# Patient Record
Sex: Female | Born: 1940 | Race: White | Hispanic: No | Marital: Married | Smoking: Former smoker
Health system: Southern US, Community
[De-identification: ages and names within clinical notes are randomized; demographics above are authoritative.]

## PROBLEM LIST (undated history)

## (undated) DIAGNOSIS — E119 Type 2 diabetes mellitus without complications: Secondary | ICD-10-CM

## (undated) DIAGNOSIS — F32A Depression, unspecified: Secondary | ICD-10-CM

## (undated) DIAGNOSIS — R519 Headache, unspecified: Secondary | ICD-10-CM

## (undated) DIAGNOSIS — I219 Acute myocardial infarction, unspecified: Secondary | ICD-10-CM

## (undated) DIAGNOSIS — T7840XA Allergy, unspecified, initial encounter: Secondary | ICD-10-CM

## (undated) DIAGNOSIS — J40 Bronchitis, not specified as acute or chronic: Secondary | ICD-10-CM

## (undated) DIAGNOSIS — F329 Major depressive disorder, single episode, unspecified: Secondary | ICD-10-CM

## (undated) DIAGNOSIS — I2510 Atherosclerotic heart disease of native coronary artery without angina pectoris: Secondary | ICD-10-CM

## (undated) DIAGNOSIS — I10 Essential (primary) hypertension: Secondary | ICD-10-CM

## (undated) DIAGNOSIS — R51 Headache: Secondary | ICD-10-CM

## (undated) DIAGNOSIS — M1990 Unspecified osteoarthritis, unspecified site: Secondary | ICD-10-CM

## (undated) DIAGNOSIS — K219 Gastro-esophageal reflux disease without esophagitis: Secondary | ICD-10-CM

## (undated) HISTORY — PX: TOTAL ABDOMINAL HYSTERECTOMY: SHX209

## (undated) HISTORY — PX: ABDOMINAL HYSTERECTOMY: SHX81

## (undated) HISTORY — PX: TONSILLECTOMY: SUR1361

## (undated) HISTORY — DX: Allergy, unspecified, initial encounter: T78.40XA

## (undated) HISTORY — PX: JOINT REPLACEMENT: SHX530

## (undated) HISTORY — DX: Major depressive disorder, single episode, unspecified: F32.9

## (undated) HISTORY — PX: APPENDECTOMY: SHX54

## (undated) HISTORY — PX: CORONARY ANGIOPLASTY: SHX604

## (undated) HISTORY — DX: Depression, unspecified: F32.A

---

## 2004-11-26 ENCOUNTER — Ambulatory Visit: Payer: Self-pay | Admitting: Unknown Physician Specialty

## 2005-12-22 ENCOUNTER — Ambulatory Visit: Payer: Self-pay

## 2006-11-02 ENCOUNTER — Ambulatory Visit: Payer: Self-pay

## 2007-08-31 ENCOUNTER — Ambulatory Visit: Payer: Self-pay | Admitting: Family Medicine

## 2008-11-12 ENCOUNTER — Ambulatory Visit: Payer: Self-pay | Admitting: Family Medicine

## 2008-11-20 ENCOUNTER — Ambulatory Visit: Payer: Self-pay | Admitting: Family Medicine

## 2009-12-09 ENCOUNTER — Ambulatory Visit: Payer: Self-pay | Admitting: Family Medicine

## 2009-12-26 ENCOUNTER — Ambulatory Visit: Payer: Self-pay | Admitting: Family Medicine

## 2010-12-30 ENCOUNTER — Ambulatory Visit: Payer: Self-pay | Admitting: Family Medicine

## 2012-01-20 ENCOUNTER — Ambulatory Visit: Payer: Self-pay | Admitting: Family Medicine

## 2012-02-09 DIAGNOSIS — M2010 Hallux valgus (acquired), unspecified foot: Secondary | ICD-10-CM | POA: Insufficient documentation

## 2012-02-09 DIAGNOSIS — G5760 Lesion of plantar nerve, unspecified lower limb: Secondary | ICD-10-CM | POA: Insufficient documentation

## 2012-11-19 ENCOUNTER — Inpatient Hospital Stay: Payer: Self-pay | Admitting: Internal Medicine

## 2012-11-19 LAB — BASIC METABOLIC PANEL
Anion Gap: 7 (ref 7–16)
BUN: 14 mg/dL (ref 7–18)
Calcium, Total: 8.6 mg/dL (ref 8.5–10.1)
Chloride: 99 mmol/L (ref 98–107)
Co2: 29 mmol/L (ref 21–32)
Creatinine: 0.89 mg/dL (ref 0.60–1.30)
EGFR (African American): >60
EGFR (Non-African Amer.): >60
Glucose: 174 mg/dL — ABNORMAL HIGH (ref 65–99)
Osmolality: 275 (ref 275–301)
Potassium: 3.7 mmol/L (ref 3.5–5.1)
Sodium: 135 mmol/L — ABNORMAL LOW (ref 136–145)

## 2012-11-19 LAB — CK TOTAL AND CKMB (NOT AT ARMC)
CK, Total: 56 U/L (ref 21–215)
CK, Total: 57 U/L (ref 21–215)
CK, Total: 52 U/L (ref 21–215)
CK-MB: 2 ng/mL (ref 0.5–3.6)
CK-MB: 1.9 ng/mL (ref 0.5–3.6)
CK-MB: 1.8 ng/mL (ref 0.5–3.6)

## 2012-11-19 LAB — CBC
MCH: 28.8 pg (ref 26.0–34.0)
MCHC: 33.8 g/dL (ref 32.0–36.0)
MCV: 85 fL (ref 80–100)
RBC: 4.49 10*6/uL (ref 3.80–5.20)

## 2012-11-19 LAB — TROPONIN I
Troponin-I: 0.04 ng/mL
Troponin-I: 0.11 ng/mL — ABNORMAL HIGH

## 2012-11-20 LAB — LIPID PANEL
HDL Cholesterol: 37 mg/dL — ABNORMAL LOW (ref 40–60)
Ldl Cholesterol, Calc: 144 mg/dL — ABNORMAL HIGH (ref 0–100)

## 2012-11-20 LAB — BASIC METABOLIC PANEL
Anion Gap: 7 (ref 7–16)
Creatinine: 0.74 mg/dL (ref 0.60–1.30)
EGFR (African American): >60
Osmolality: 274 (ref 275–301)
Potassium: 3.9 mmol/L (ref 3.5–5.1)
Sodium: 136 mmol/L (ref 136–145)

## 2012-11-21 LAB — CBC
HGB: 13.3 g/dL (ref 12.0–16.0)
MCV: 86 fL (ref 80–100)
Platelet: 226 10*3/uL (ref 150–440)
RDW: 14 % (ref 11.5–14.5)
WBC: 6.8 10*3/uL (ref 3.6–11.0)

## 2012-11-21 LAB — BASIC METABOLIC PANEL
Anion Gap: 2 — ABNORMAL LOW (ref 7–16)
BUN: 11 mg/dL (ref 7–18)
Calcium, Total: 8.6 mg/dL (ref 8.5–10.1)
Chloride: 102 mmol/L (ref 98–107)
Co2: 33 mmol/L — ABNORMAL HIGH (ref 21–32)
EGFR (African American): >60
EGFR (Non-African Amer.): >60
Glucose: 153 mg/dL — ABNORMAL HIGH (ref 65–99)
Sodium: 137 mmol/L (ref 136–145)

## 2012-11-22 LAB — BASIC METABOLIC PANEL
BUN: 9 mg/dL (ref 7–18)
Co2: 28 mmol/L (ref 21–32)
EGFR (African American): >60
EGFR (Non-African Amer.): >60
Glucose: 138 mg/dL — ABNORMAL HIGH (ref 65–99)
Osmolality: 277 (ref 275–301)
Potassium: 3.9 mmol/L (ref 3.5–5.1)
Sodium: 138 mmol/L (ref 136–145)

## 2012-11-22 LAB — CK TOTAL AND CKMB (NOT AT ARMC): CK, Total: 29 U/L (ref 21–215)

## 2012-11-26 ENCOUNTER — Inpatient Hospital Stay: Payer: Self-pay | Admitting: Family Medicine

## 2012-11-26 LAB — BASIC METABOLIC PANEL
Anion Gap: 8 (ref 7–16)
BUN: 14 mg/dL (ref 7–18)
Calcium, Total: 9.2 mg/dL (ref 8.5–10.1)
Creatinine: 0.87 mg/dL (ref 0.60–1.30)
EGFR (African American): >60
EGFR (Non-African Amer.): >60
Osmolality: 269 (ref 275–301)
Potassium: 3.6 mmol/L (ref 3.5–5.1)
Sodium: 132 mmol/L — ABNORMAL LOW (ref 136–145)

## 2012-11-26 LAB — APTT
Activated PTT: 44.1 secs — ABNORMAL HIGH (ref 23.6–35.9)
Activated PTT: 55 secs — ABNORMAL HIGH (ref 23.6–35.9)

## 2012-11-26 LAB — CK TOTAL AND CKMB (NOT AT ARMC)
CK, Total: 41 U/L (ref 21–215)
CK, Total: 39 U/L (ref 21–215)
CK-MB: 1.8 ng/mL (ref 0.5–3.6)
CK-MB: 1.8 ng/mL (ref 0.5–3.6)

## 2012-11-26 LAB — CBC
MCHC: 34.4 g/dL (ref 32.0–36.0)
RDW: 13.7 % (ref 11.5–14.5)

## 2012-11-26 LAB — PROTIME-INR: Prothrombin Time: 11.7 secs (ref 11.5–14.7)

## 2012-11-27 LAB — CBC WITH DIFFERENTIAL/PLATELET
Basophil #: 0.1 10*3/uL (ref 0.0–0.1)
Eosinophil #: 0.3 10*3/uL (ref 0.0–0.7)
Eosinophil %: 3.9 %
HCT: 31.5 % — ABNORMAL LOW (ref 35.0–47.0)
HGB: 10.9 g/dL — ABNORMAL LOW (ref 12.0–16.0)
Lymphocyte #: 3.1 10*3/uL (ref 1.0–3.6)
MCH: 29.2 pg (ref 26.0–34.0)
MCHC: 34.5 g/dL (ref 32.0–36.0)
MCV: 85 fL (ref 80–100)
Neutrophil #: 4.5 10*3/uL (ref 1.4–6.5)
RBC: 3.73 10*6/uL — ABNORMAL LOW (ref 3.80–5.20)
RDW: 13.7 % (ref 11.5–14.5)

## 2012-11-27 LAB — COMPREHENSIVE METABOLIC PANEL
Albumin: 2.9 g/dL — ABNORMAL LOW (ref 3.4–5.0)
Alkaline Phosphatase: 75 U/L (ref 50–136)
Anion Gap: 6 — ABNORMAL LOW (ref 7–16)
Calcium, Total: 8.5 mg/dL (ref 8.5–10.1)
Chloride: 100 mmol/L (ref 98–107)
Co2: 31 mmol/L (ref 21–32)
Creatinine: 0.81 mg/dL (ref 0.60–1.30)
EGFR (African American): >60
EGFR (Non-African Amer.): >60
Osmolality: 274 (ref 275–301)
SGPT (ALT): 15 U/L (ref 12–78)

## 2012-11-27 LAB — TROPONIN I: Troponin-I: 0.13 ng/mL — ABNORMAL HIGH

## 2012-11-27 LAB — APTT: Activated PTT: 96.5 secs — ABNORMAL HIGH (ref 23.6–35.9)

## 2012-11-28 LAB — CBC WITH DIFFERENTIAL/PLATELET
Basophil #: 0.1 10*3/uL (ref 0.0–0.1)
Eosinophil #: 0.4 10*3/uL (ref 0.0–0.7)
HGB: 10.8 g/dL — ABNORMAL LOW (ref 12.0–16.0)
Lymphocyte #: 2.9 10*3/uL (ref 1.0–3.6)
Lymphocyte %: 29.6 %
MCH: 29.1 pg (ref 26.0–34.0)
MCV: 85 fL (ref 80–100)
Monocyte %: 10.3 %
Neutrophil #: 5.4 10*3/uL (ref 1.4–6.5)
Platelet: 210 10*3/uL (ref 150–440)
RBC: 3.73 10*6/uL — ABNORMAL LOW (ref 3.80–5.20)
RDW: 14 % (ref 11.5–14.5)
WBC: 9.7 10*3/uL (ref 3.6–11.0)

## 2012-11-28 LAB — BASIC METABOLIC PANEL
BUN: 10 mg/dL (ref 7–18)
Calcium, Total: 8.6 mg/dL (ref 8.5–10.1)
Creatinine: 0.78 mg/dL (ref 0.60–1.30)
EGFR (Non-African Amer.): >60
Osmolality: 277 (ref 275–301)
Sodium: 138 mmol/L (ref 136–145)

## 2012-11-28 LAB — APTT: Activated PTT: 157.8 secs — ABNORMAL HIGH (ref 23.6–35.9)

## 2013-02-17 LAB — CK TOTAL AND CKMB (NOT AT ARMC): CK-MB: 1.4 ng/mL (ref 0.5–3.6)

## 2013-02-17 LAB — URINALYSIS, COMPLETE
Blood: NEGATIVE
Glucose,UR: NEGATIVE mg/dL (ref 0–75)
Leukocyte Esterase: NEGATIVE
Nitrite: NEGATIVE
Ph: 5 (ref 4.5–8.0)
Protein: NEGATIVE
RBC,UR: NONE SEEN /HPF (ref 0–5)
Specific Gravity: 1.01 (ref 1.003–1.030)

## 2013-02-17 LAB — COMPREHENSIVE METABOLIC PANEL
Albumin: 3.1 g/dL — ABNORMAL LOW (ref 3.4–5.0)
BUN: 16 mg/dL (ref 7–18)
Bilirubin,Total: 0.2 mg/dL (ref 0.2–1.0)
Co2: 27 mmol/L (ref 21–32)
EGFR (African American): >60
Glucose: 222 mg/dL — ABNORMAL HIGH (ref 65–99)
Potassium: 3.7 mmol/L (ref 3.5–5.1)
SGPT (ALT): 28 U/L (ref 12–78)
Sodium: 137 mmol/L (ref 136–145)

## 2013-02-18 ENCOUNTER — Observation Stay: Payer: Self-pay | Admitting: Specialist

## 2013-02-18 LAB — CK TOTAL AND CKMB (NOT AT ARMC)
CK, Total: 38 U/L (ref 21–215)
CK, Total: 29 U/L (ref 21–215)
CK-MB: 1.4 ng/mL (ref 0.5–3.6)
CK-MB: 0.8 ng/mL (ref 0.5–3.6)

## 2013-02-18 LAB — TROPONIN I: Troponin-I: <0.02 ng/mL

## 2013-02-18 LAB — CBC
HGB: 12.3 g/dL (ref 12.0–16.0)
MCH: 28.2 pg (ref 26.0–34.0)
MCV: 84 fL (ref 80–100)
RBC: 4.36 10*6/uL (ref 3.80–5.20)
WBC: 11.4 10*3/uL — ABNORMAL HIGH (ref 3.6–11.0)

## 2013-02-19 LAB — CBC WITH DIFFERENTIAL/PLATELET
Basophil #: 0 10*3/uL (ref 0.0–0.1)
Basophil %: 0.6 %
Eosinophil #: 0.4 10*3/uL (ref 0.0–0.7)
Eosinophil %: 5.9 %
Lymphocyte #: 2.9 10*3/uL (ref 1.0–3.6)
MCH: 28.6 pg (ref 26.0–34.0)
MCV: 84 fL (ref 80–100)
Monocyte #: 0.7 x10 3/mm (ref 0.2–0.9)
Neutrophil #: 3.5 10*3/uL (ref 1.4–6.5)
Platelet: 193 10*3/uL (ref 150–440)
WBC: 7.6 10*3/uL (ref 3.6–11.0)

## 2013-02-19 LAB — BASIC METABOLIC PANEL
Anion Gap: 3 — ABNORMAL LOW (ref 7–16)
Calcium, Total: 8.9 mg/dL (ref 8.5–10.1)
EGFR (Non-African Amer.): >60
Glucose: 142 mg/dL — ABNORMAL HIGH (ref 65–99)
Osmolality: 279 (ref 275–301)
Potassium: 4.1 mmol/L (ref 3.5–5.1)
Sodium: 138 mmol/L (ref 136–145)

## 2013-02-21 ENCOUNTER — Encounter: Payer: Self-pay | Admitting: Cardiology

## 2013-02-22 ENCOUNTER — Encounter: Payer: Self-pay | Admitting: Cardiology

## 2013-02-27 ENCOUNTER — Ambulatory Visit: Payer: Self-pay | Admitting: Cardiology

## 2013-03-06 ENCOUNTER — Ambulatory Visit: Payer: Self-pay | Admitting: Cardiology

## 2013-03-25 ENCOUNTER — Encounter: Payer: Self-pay | Admitting: Cardiology

## 2013-04-24 ENCOUNTER — Encounter: Payer: Self-pay | Admitting: Cardiology

## 2013-05-25 ENCOUNTER — Encounter: Payer: Self-pay | Admitting: Cardiology

## 2013-06-25 ENCOUNTER — Encounter: Payer: Self-pay | Admitting: Cardiology

## 2013-07-23 ENCOUNTER — Encounter: Payer: Self-pay | Admitting: Cardiology

## 2013-09-08 ENCOUNTER — Ambulatory Visit: Payer: Self-pay | Admitting: Family Medicine

## 2013-09-13 ENCOUNTER — Ambulatory Visit: Payer: Self-pay | Admitting: Family Medicine

## 2014-09-14 NOTE — Consult Note (Signed)
Brief Consult Note: Diagnosis: [place]/CP/CAD.   Patient was seen by consultant.   Recommend to proceed with surgery or procedure.   Recommend further assessment or treatment.   Orders entered.   Discussed with Attending MD.   Comments: IMP [place]? CAD Angina S/P PCI RCA DM HTN Obesity Hyperlipidemia GERD . PLAN Tele Increase activiy, ambulate in hall Plavix 75 mg daily Agree with B-blockers Imdur for angina Consider Ranexa ACE for Bp Agree with short term anticoug/Heparin IV  Consider repeat cardiac cath Monday.  Electronic Signatures: Lujean Amel D (MD)  (Signed 06-Jul-14 08:26)  Authored: Brief Consult Note   Last Updated: 06-Jul-14 08:26 by Lujean Amel D (MD)

## 2014-09-14 NOTE — H&P (Signed)
PATIENT NAME:  Susan Allen, Susan Allen MR#:  [id number] DATE OF BIRTH:  02/12/1941  DATE OF ADMISSION:  11/19/2012  PRIMARY CARE PHYSICIAN: Benjamine Mola B. White, FNP   PRIMARY CARDIOLOGIST: Javier Docker. Ubaldo Glassing, MD  REFERRING PHYSICIAN: Conni Slipper, MD  CHIEF COMPLAINT: Chest pain.   HISTORY OF PRESENT ILLNESS: Susan Allen is a 74 year old pleasant white female with past medical history of hypertension, hyperlipidemia, diabetes mellitus, previous history of smoking, gastroesophageal reflux disease, who presented to the Emergency Department with complaints of chest pain. The patient has been experiencing this chest pain on and off for the last few months, especially with this pain after eating and with exertion. Usually relieved by burping. Concerning this, went to her primary care physician, who referred her to Dr. Ubaldo Glassing. The patient underwent stress test about 5 weeks back which did not show any inducible ischemia. The patient was referred to gastroenterologist and underwent EGD, which showed gastroesophageal reflux disease with gastritis and esophagitis. The patient was prescribed Prilosec. However, the patient continues to have this pain. Last night, she started to experience pain on the left side of the chest, pressure-like pain with both upper extremity numbness. This was also associated with some shortness of breath. The patient continues to have this pain. Concerning this, came to the Emergency Department. On workup in the Emergency Department, the patient had elevated troponin of 0.08. CK-MBs well within normal limits. The patient at baseline swims 3 times a week without getting chest pain, also well active with ADLs and IADLs.   PAST MEDICAL HISTORY:  1. Hypertension.  2. Hyperlipidemia.  3. Diabetes mellitus.  4. 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   PAST SURGICAL HISTORY: Hysterectomy.   ALLERGIES:  1. LISINOPRIL. 2. INDOCIN.  3. ASPIRIN.   HOME MEDICATIONS: 1. Prilosec 20 mg  once a day.  2. Plavix 75 mg once a day.  3. Metformin 500 mg 2 times a day. 4. Hydrochlorothiazide 25 mg once a day. 5. Fluoxetine 40 mg once a day.  6. Atenolol 25 mg once a day.   SOCIAL HISTORY: Smoked in the past about 1 pack a day, but in the end smoked about 1/2-pack a day, quit about 40 years back. Drinks alcohol occasionally. Denies using any illicit drugs. Married, lives with her husband.   FAMILY HISTORY: Multiple family members deceased from the cancer. Mother with colon cancer. Father with lung cancer. Sister with ovarian cancer. One of her nieces had leukemia.   REVIEW OF SYSTEMS:  CONSTITUTIONAL: Denies any fever, fatigue, weakness.  EYES: No change in vision.  ENT: No change in hearing or tinnitus.  RESPIRATORY: No cough, shortness of breath.  CARDIOVASCULAR: Chest pain. No pedal edema.  GASTROINTESTINAL: No nausea, vomiting, abdominal pain.  GENITOURINARY: No dysuria or hematuria.  ENDOCRINE: No polyuria or polydipsia. The patient carries a diagnosis of diabetes mellitus.  HEMATOLOGIC: No easy bruising or bleeding.  SKIN: No rash or lesions.  MUSCULOSKELETAL: No joint pains, aches or swelling. NEUROLOGIC: No weakness or numbness in any part of the body.   PHYSICAL EXAMINATION:  GENERAL: This is a well-built, well-nourished, age-appropriate female lying down in the bed, not in distress.  VITAL SIGNS: Temperature 98, pulse 66, blood pressure 170/71, respiratory rate of 18, oxygen saturation is 96% on room air.  HEENT: Head normocephalic, atraumatic. Eyes: No sclerae icterus. Conjunctivae normal. Pupils equal and reactive to light. Mucous membranes moist. No pharyngeal erythema.  NECK: Supple. No lymphadenopathy. No JVD. No carotid bruit. No thyromegaly.  CHEST: Has no focal tenderness.  LUNGS: Bilaterally clear to auscultation.  HEART: S1 and S2 regular. No murmurs are heard. No pedal edema. Pulses 2+.  SKIN: No rash or lesions.  MUSCULOSKELETAL: Good range of motion  in all the extremities.  ABDOMEN: Bowel sounds present. Soft, nontender, nondistended. No hepatosplenomegaly.  NEUROLOGIC: The patient is alert, oriented to place, person and time. Cranial nerves II through XII intact. No motor and sensory deficits.   LABORATORY DATA: CBC is completely within normal limits. CMP: BUN 14, creatinine of 0.89, the rest of all the values are within normal limits. Troponin: Initial set was 0.08, repeat 0.04, CK-MB of 2.   ELECTROCARDIOGRAM, 12-LEAD: Normal sinus rhythm with no ST-T wave abnormalities.   ASSESSMENT AND PLAN: Susan Allen is a 74 year old female who comes to the Emergency Department with complaints of chest pain.   1. Chest pain. Concerning about the patient's risk factors of the patient's age, diabetes mellitus, hypertension, peripheral vascular disease, carotid artery stenosis and a previous history of tobacco use, concerning for underlying coronary artery disease. The patient had a recent stress test which was negative. However, will consult Dr. Ubaldo Glassing for possible left heart catheterization. Will continue to cycle cardiac biomarkers x3. Continue with the home medications.  2. Hypertension, poorly controlled. This could be secondary to anxiety. Will continue the home medications and follow up. However, will hold the hydrochlorothiazide for now.  3. Diabetes mellitus. Will hold the metformin. Will continue with sliding scale insulin.  4. Keep the patient on deep vein thrombosis prophylaxis with Lovenox.   TIME SPENT: 45 minutes.   ____________________________ Monica Becton, MD pv:OSi D: 11/19/2012 07:28:32 ET T: 11/19/2012 08:09:51 ET JOB#: [id number]  cc: Monica Becton, MD, <Dictator> Dani Gobble. Dema Severin, FNP Grier Mitts Rafan Sanders MD ELECTRONICALLY SIGNED 11/30/2012 21:53

## 2014-09-14 NOTE — Consult Note (Signed)
Chief Complaint:  Subjective/Chief Complaint Pt doing ok no cp no sob . Ambulating in halls well. Tolerating Ranexa.   VITAL SIGNS/ANCILLARY NOTES: **Vital Signs.:   06-Jul-14 11:49  Vital Signs Type Q 4hr  Temperature Temperature (F) 97.7  Celsius 36.5  Temperature Source oral  Pulse Pulse 56  Respirations Respirations 18  Systolic BP Systolic BP 706  Diastolic BP (mmHg) Diastolic BP (mmHg) 71  Mean BP 95  Pulse Ox % Pulse Ox % 97  Pulse Ox Activity Level  At rest  Oxygen Delivery Room Air/ 21 %  *Intake and Output.:   Shift 06-Jul-14 15:00  Grand Totals Intake:  480 Output:  200    Net:  280 24 Hr.:  280  Oral Intake      In:  480  Urine ml     Out:  200  Length of Stay Totals Intake:  1325 Output:  2200    Net:  -875   Brief Assessment:  GEN well developed, well nourished, no acute distress   Cardiac Regular  -- LE edema  --Rub  --Gallop   Respiratory normal resp effort  clear BS   Gastrointestinal Normal   Gastrointestinal details normal Soft   EXTR negative cyanosis/clubbing, negative edema   Lab Results: Hepatic:  06-Jul-14 04:52   Bilirubin, Total 0.4  Alkaline Phosphatase 75  SGPT (ALT) 15  SGOT (AST) 17  Total Protein, Serum  6.3  Albumin, Serum  2.9  Routine Chem:  06-Jul-14 04:52   Glucose, Serum  124  BUN 10  Creatinine (comp) 0.81  Sodium, Serum 137  Potassium, Serum 3.8  Chloride, Serum 100  CO2, Serum 31  Calcium (Total), Serum 8.5  Anion Gap  6  Osmolality (calc) 274  eGFR (African American) >60  eGFR (Non-African American) >60 (eGFR values <71m/min/1.73 m2 may be an indication of chronic kidney disease (CKD). Calculated eGFR is useful in patients with stable renal function. The eGFR calculation will not be reliable in acutely ill patients when serum creatinine is changing rapidly. It is not useful in  patients on dialysis. The eGFR calculation may not be applicable to patients at the low and high extremes of body sizes,  pregnant women, and vegetarians.)  Result Comment WBC/PLATELET COUNT - PLATELET CLUMPING IN SPECIMEN. RESULTS  - OBTAINED FROM CITRATE TUBE.  Result(s) reported on 27 Nov 2012 at 06:15AM.    22:51   Result Comment TROPONIN - RESULTS VERIFIED BY REPEAT TESTING.  - C/KEISHA WILSON AT 2338 11/27/12.PMH  - READ-BACK PROCESS PERFORMED.  Result(s) reported on 27 Nov 2012 at 11:40PM.  Cardiac:  06-Jul-14 22:51   Troponin I  0.13 (0.00-0.05 0.05 ng/mL or less: NEGATIVE  Repeat testing in 3-6 hrs  if clinically indicated. >0.05 ng/mL: POTENTIAL  MYOCARDIAL INJURY. Repeat  testing in 3-6 hrs if  clinically indicated. NOTE: An increase or decrease  of 30% or more on serial  testing suggests a  clinically important change)  Routine Coag:  06-Jul-14 04:52   Activated PTT (APTT)  96.5 (A HCT value >55% may artifactually increase the APTT. In one study, the increase was an average of 19%. Reference: "Effect on Routine and Special Coagulation Testing Values of Citrate Anticoagulant Adjustment in Patients with High HCT Values." American Journal of Clinical Pathology 2006;126:400-405.)  Routine Hem:  06-Jul-14 04:52   WBC (CBC) 6.7  RBC (CBC)  3.73  Hemoglobin (CBC)  10.9  Hematocrit (CBC)  31.5  Platelet Count (CBC) 222  MCV 85  MCH 29.2  MCHC 34.5  RDW 13.7  Neutrophil % 50.9  Lymphocyte % 35.4  Monocyte % 9.0  Eosinophil % 3.9  Basophil % 0.8  Neutrophil # 4.5  Lymphocyte # 3.1  Monocyte # 0.8  Eosinophil # 0.3  Basophil # 0.1   Radiology Results: XRay:    05-Jul-14 06:33, Chest Portable Single View  Chest Portable Single View   REASON FOR EXAM:    Chest Pain  COMMENTS:       PROCEDURE: DXR - DXR PORTABLE CHEST SINGLE VIEW  - Nov 26 2012  6:33AM     RESULT: Comparison: 11/19/2012    Findings:  The heart and mediastinum are stable. Slight increased density at the   right lung base is felt to be related to technique. Small density   overlying the right first  costochondral junction is likely artifactual,   similar to prior.    IMPRESSION:   1. No acute cardiopulmonary disease.  2. Small density overlying the medial right upper lung is felt to be   artifactual secondary to overlying first costochondral junction. However,   followup chest radiograph is suggested to ensure stability.      Dictation Site: 8        Verified By: Gregor Hams, M.D., MD  Cardiology:    05-Jul-14 06:19, ED ECG  Ventricular Rate 65  Atrial Rate 65  P-R Interval 140  QRS Duration 142  QT 426  QTc 443  P Axis 36  R Axis -35  T Axis 79  ECG interpretation   Normal sinus rhythm  Left axis deviation  Left bundle branch block  Abnormal ECG  When compared with ECG of 21-Nov-2012 17:07,  T wave inversion no longer evident in Inferior leads  ----------unconfirmed----------  Confirmed by OVERREAD, NOT ([id number]), editor PEARSON, BARBARA ([id number]) on 11/26/2012 11:23:55 AM  ED ECG    Assessment/Plan:  Assessment/Plan:  Assessment IMP CP Angina CAD HTN Hyperlipidemia Allergy to ASA S/P complex PCI partial success GERD DM .   Plan PLAN Tele Abulate Continue plavix Imdur for angina Ranexa 500 bid for angina Continue statin Agree with B-blocker Hopefully d/c on medical therapy Cae discussed with AP/KF   Electronic Signatures: Lujean Amel D (MD)  (Signed 07-Jul-14 07:40)  Authored: Chief Complaint, VITAL SIGNS/ANCILLARY NOTES, Brief Assessment, Lab Results, Radiology Results, Assessment/Plan   Last Updated: 07-Jul-14 07:40 by Lujean Amel D (MD)

## 2014-09-14 NOTE — Consult Note (Signed)
PATIENT NAME:  Susan Allen, Susan Allen MR#:  [id number] DATE OF BIRTH:  1941/03/01  CARDIOLOGY CONSULTATION  DATE OF CONSULTATION:  02/19/2013  CHIEF COMPLAINT: Chest pain.  HISTORY OF PRESENT ILLNESS: This is a 74 year old female with known coronary artery disease status post multiple stent placements in the right coronary artery in recent months, with diabetes, hypertension. The patient has had a new onset of significant substernal chest discomfort, arm tingling bilaterally occurring spontaneously which lasted approximately 5 to  10 minutes and was relieved spontaneously. The patient has had no recurrence of this in the hospitalization. The patient did have some abnormal EKG, unchanged from before, showing normal sinus rhythm with a left bundle branch block. Troponin was normal.   The patient has had no further episodes of chest discomfort throughout her hospitalization, although this chest discomfort is consistent with her previous myocardial infarction and chest discomfort she had from her previous stent placement. The patient has had no worsening of diabetes, hypertension, hyperlipidemia.   REVIEW OF SYSTEMS: The remainder of her review of systems negative for vision change, ringing in the ears, hearing loss, cough, congestion, heartburn, nausea, vomiting, diarrhea, bloody stools, stomach pain, extremity pain, leg weakness, cramping of the buttocks, known blood clots, headaches, blackouts, dizzy spells, nosebleeds, congestion, trouble swallowing, frequent urination, urination at night, muscle weakness, numbness, anxiety, depression, skin lesions, skin rashes.   PAST MEDICAL HISTORY: 1.  Coronary artery disease, status post multiple right coronary artery stents.  2.  Diabetes mellitus.  3.  Hypertension.   FAMILY HISTORY: No family members with early-onset cardiovascular disease or hypertension.   SOCIAL HISTORY: She currently denies alcohol or tobacco use.   SHE HAS ALLERGIES TO MEDICATIONS AS  LISTED.   PHYSICAL EXAM: VITAL SIGNS: Blood pressure 126/68 bilaterally, heart rate 72 upright, reclining, and regular.  GENERAL: She is a well-appearing female in no acute distress.  HEENT: No icterus, thyromegaly, ulcers, hemorrhage, or xanthelasma.  CARDIOVASCULAR: Regular rate and rhythm, with normal S1 and S2, without murmur, gallop, or rub. PMI is normal size and placement. Carotid upstroke normal, without bruit. Jugular venous pressure is normal.  LUNGS: A few basilar crackles, with normal respirations.  ABDOMEN: Soft, nontender, without hepatosplenomegaly or masses. Abdominal aorta is normal size, without bruit.  EXTREMITIES: 2+ radial, femoral, dorsal pedal pulses with no lower extremity edema, cyanosis, clubbing or ulcers.  NEUROLOGIC: She is oriented to time, place, and person, with normal mood and affect.   ASSESSMENT: A 74 year old female with coronary artery disease, hypertension, hyperlipidemia, diabetes, with acute substernal chest discomfort, anginal in nature, without evidence of myocardial infarction. After review of cardiac catheterization previously the patient did have a complicated right coronary artery which can be continued with medical management, with circumflex artery with modest atherosclerosis and LAD minimal atherosclerosis. Therefore medication management would still be appropriate.   RECOMMENDATIONS: 1.  Continue isosorbide 30 mg each day, and increase dose as necessary with ambulation.  2. Continue ambulation today, following for further evidence of significant chest discomfort or true angina requiring further intervention including cardiac catheterization, which we have discussed today.  3. Continue medication management including metoprolol and possible addition of calcium channel blocker; it may help with chest discomfort in the future.  4.  No further cardiac diagnostics at this time due to no evidence of myocardial infarction.  5.  Possible discharge home if  physically improved, with no further significant symptoms, with followup next week.    ____________________________ Corey Skains, MD bjk:dm D: 02/19/2013 08:10:59 ET  T: 02/19/2013 08:40:44 ET JOB#: [id number]  cc: Corey Skains, MD, <Dictator> Corey Skains MD ELECTRONICALLY SIGNED 02/21/2013 10:34

## 2014-09-14 NOTE — Discharge Summary (Signed)
PATIENT NAME:  Susan Allen, Susan Allen MR#:  [id number] DATE OF BIRTH:  04/11/1941  DATE OF ADMISSION:  11/19/2012 DATE OF DISCHARGE:  11/22/2012  PRIMARY CARE PHYSICIAN:  Eulogio Bear.  Of note, she does not work with the vascular surgery team anymore.    FINAL DIAGNOSES: 1.  Chest pain, with elevated troponin, possible mild myocardial infarction.  2.  Hypertension.  3.  Diabetes.  4.  Hyperlipidemia.  5.  Gastroesophageal reflux disease.  6.  Peripheral vascular disease.   MEDICATIONS ON DISCHARGE:  Included atenolol 25 mg daily, fluoxetine 40 mg daily, Prilosec OTC 20 mg daily, losartan 50 mg daily, nitroglycerin 0.4 mg sublingual tablet every 5 minutes as needed for chest pain, maximum 3; metformin 500 mg twice a day, do not restart until 11/23/2012; pravastatin 10 mg at bedtime, Plavix 75 mg daily, Imdur 30 mg daily.   DIET: Low sodium, carbohydrate-controlled diet, regular consistency.   ACTIVITY: As tolerated.   DISCHARGE FOLLOWUP:  Follow up with cardiology, Dr. Ubaldo Glassing or Dr. Saralyn Pilar in 1 week.  Follow up in 1 to 2 weeks with physician assistant, Eulogio Bear.    HOSPITAL COURSE: The patient was admitted 11/19/2012, discharged 11/22/2012. Came in with chest pain, found to have an elevated troponin. She does have a history of hypertension and diabetes. Cardiology consultation was obtained by Dr. Saralyn Pilar. Laboratory and radiological data during the hospital course include a troponin that that was negative. Glucose 174, BUN 14, creatinine 0.89, sodium 135, potassium 3.7, chloride 99, CO2 of 29, calcium 8.6. White blood cell count 7.8, H and H 12.9 and 38.2, platelet count of 198.  Chest x-ray: No evidence of CHF or pneumonia. Next troponin borderline at 0.08. Next troponin borderline at 0.11.  Next troponin borderline at 0.14. LDL 144, HDL 37, triglycerides 272. Echo: EF 50% to 55%. EKG on 11/21/2012, showed sinus bradycardia, left bundle branch block. Creatinine upon discharge was 0.76.  Cardiac cath on 11/21/2012, showed EF of 67%. First lesion: Balloon angioplasty, 90% lesion, distal RCA with a residual 50% stenosis. Second lesion: Balloon angioplasty, 90% lesion, mid RCA, residual 50% stenosis. Third lesion: 90% lesion of the proximal RCA, stents placed, 0 residual. The patient felt well throughout the entire hospitalization.   HOSPITAL COURSE PER PROBLEM LIST:  1.  For the patient's chest pain with borderline elevated troponin, possible mild myocardial infarction requiring stent and 2 angioplasties. Close clinical followup with cardiology as an outpatient with medical management. The patient has an ALLERGY TO ASPIRIN. The patient is on atenolol and losartan. The patient has multiple PROBLEMS WITH STATINS. I will try low-dose pravastatin to see if she has an issue, and I did add Imdur.  2.  Hypertension. Blood pressure controlled upon discharge, 111/50.  3.  Diabetes. Patient can restart her Glucophage on 07/02.  4.  Hyperlipidemia. Goal LDL under 100. I did start low-dose statin because she has MULTIPLE ALLERGIES TO THE OTHER STATINS, and see how she tolerates the pravastatin. Can increase the dose as outpatient, depending on her tolerability.  5.  Gastroesophageal reflux disease. She is on omeprazole.  6.  Peripheral vascular disease. She is on Plavix and with her stent, she will continue to need Plavix as outpatient. I did write her a 30 day supply without any refills; that ensures that she will follow up.   TIME SPENT ON DISCHARGE: 35 minutes.   ____________________________ Tana Conch. Leslye Peer, MD rjw:dmm D: 11/22/2012 16:34:11 ET T: 11/22/2012 19:42:57 ET JOB#: [id number]  cc: Tana Conch. [place],  MD, <Dictator> Dani Gobble. Dema Severin, FNP Javier Docker Ubaldo Glassing, MD Isaias Cowman, MD Marisue Brooklyn MD ELECTRONICALLY SIGNED 12/03/2012 11:05

## 2014-09-14 NOTE — Consult Note (Signed)
PATIENT NAME:  Susan Allen, Susan Allen MR#:  [id number] DATE OF BIRTH:  [birth year]  DATE OF CONSULTATION:  11/19/2012  CONSULTING PHYSICIAN:  Isaias Cowman, MD  PRIMARY CARE PHYSICIAN: Dani Gobble. White, FNP  CHIEF COMPLAINT: Chest pain.   REASON FOR CONSULTATION: Consultation requested for evaluation of chest pain and elevated troponin.   HISTORY OF PRESENT ILLNESS: The patient is a 74 year old female with history of multiple cardiovascular risk factors including hypertension, hyperlipidemia and diabetes, who was referred for evaluation of chest pain and borderline elevated troponin. The patient has had a history of recurrent episodes of midsternal chest discomfort for several months. Had negative Lexiscan sestamibi study 08/01/2012. The patient underwent recent upper endoscopy which apparently revealed Barrett's esophagus, and the patient was treated with omeprazole. The patient returned to [facility] on 11/18/2012 with substernal chest pressure unlike symptoms she experienced previously. She describes it as pressure-like sensation which radiated down her left arm, associated with shortness of breath. EKG was nondiagnostic. The patient had borderline elevated troponin of 0.08.   PAST MEDICAL HISTORY:  1. Hypertension.  2. Hyperlipidemia.  3. Diabetes.  4. History of left carotid artery stenosis.   MEDICATIONS:  1. Plavix 75 mg daily.  2. Hydrochlorothiazide 25 mg daily.  3. Atenolol 25 mg daily.  4. Prilosec 20 mg daily.  5. Metformin 500 mg b.i.d. 6. Fluoxetine 40 mg daily.   SOCIAL HISTORY: The patient is married, lives with her husband. The patient has a remote tobacco abuse history, quit 40 years ago.   FAMILY HISTORY: No immediate family history for myocardial infarction or coronary artery disease.   REVIEW OF SYSTEMS:  CONSTITUTIONAL: No fever or chills.  EYES: No blurry vision.  EARS: No hearing loss.  RESPIRATORY: No shortness of breath.  CARDIOVASCULAR: Chest pain  as described above.  GASTROINTESTINAL: The patient did have midepigastric discomfort attributed to Barrett's esophagus.  GENITOURINARY: No dysuria or hematuria.  ENDOCRINE: No polyuria or polydipsia.  MUSCULOSKELETAL: No arthralgias or myalgias.  NEUROLOGICAL: No focal muscle weakness or numbness.  PSYCHOLOGICAL: No depression or anxiety.   PHYSICAL EXAMINATION:  VITAL SIGNS: Blood pressure 161/78, pulse 64, respirations 18, temperature 97.7, pulse oximetry 96%.  HEENT: Pupils equally reactive to light and accommodation.  NECK: Supple, without thyromegaly.  LUNGS: Clear.  CARDIOVASCULAR: Normal JVP. Normal PMI. Regular rate and rhythm. Normal S1, S2. No appreciable gallop, murmur or rub.  ABDOMEN: Soft and nontender. Pulses were intact bilaterally.  MUSCULOSKELETAL: Normal muscle tone.  NEUROLOGICAL: The patient is alert and oriented x3. Motor and sensory both grossly intact.   IMPRESSION: A 74 year old female with multiple cardiovascular risk factors, with previous negative Lexiscan sestamibi study, now with different type of chest pain more consistent with unstable angina. The patient has borderline elevated troponin.   RECOMMENDATIONS:  1. Agree with overall current therapy.  2. If troponin continues to rise, then would add full-dose anticoagulation with Lovenox or heparin bolus and drip.  3. Review 2-D echocardiogram.  4. Proceed with cardiac catheterization with selective coronary arteriography on 11/21/2012. Risks, benefits and alternatives were explained and informed written consent obtained.   ____________________________ Isaias Cowman, MD ap:OSi D: 11/19/2012 12:29:17 ET T: 11/19/2012 13:07:55 ET JOB#: [id number]  cc: Isaias Cowman, MD, <Dictator> Isaias Cowman MD ELECTRONICALLY SIGNED 12/13/2012 12:28

## 2014-09-14 NOTE — H&P (Signed)
PATIENT NAME:  Susan Allen, Susan Allen MR#:  [id number] DATE OF BIRTH:  [birth year]  DATE OF ADMISSION:  11/26/2012  PRIMARY CARE PHYSICIAN: Benjamine Mola B. White, FNP  PRIMARY CARDIOLOGIST: Javier Docker. Ubaldo Glassing, MD  CHIEF COMPLAINT: Chest pain.  REFERRING PHYSICIAN: John H. Jasmine December, MD  HISTORY OF PRESENT ILLNESS: Susan Allen is a very nice 74 year old female who has been recently admitted on 11/19/2012, discharged on 11/22/2012. At the moment, diagnosis was chest pain with a non-ST elevation MI. The patient has hypertension, diabetes, hyperlipidemia, GERD and some peripheral vascular disease for which she takes Plavix due to carotid artery stenosis. The patient was seen because of chest pain with elevated troponins. She has hypertension. She was admitted and had troponin on admission that was negative on the 28th, and then the second troponin went to 0.08, the third one was 0.11 and the next one 0.14. By discharge, the troponins were not rechecked. The patient had a cholesterol LDL of 144, and the patient was sensitive to statins, for which she was discharged on very low dose of pravastatin. The patient was already taking Plavix. She is not able to take aspirin. She had echocardiogram that showed an ejection fraction of 50% to 55%. Her EKG has previously been sinus bradycardia. At this moment, the patient comes in, she has a left bundle branch which has been seen before, and her heart rate is in the 60s. The patient has no ST depression or elevation, although again she does have a left bundle branch. She might have a J-point elevation at the level of septal leads, but again unable to really correlate based on the finding of left bundle branch. The patient underwent a cardiac catheterization on 11/21/2012. Her ejection fraction was 67%. She had a balloon angioplasty of several lesions, 90% lesion of the distal RCA with residual 50% stenosis, second lesion was 90% of the mid RCA with 50% stenosis, and third lesion was 90%  on the proximal RCA, and a stent was placed at that moment. The patient was discharged in good condition. She went home and stayed there up until today, when she woke up at 4:00 a.m. in the morning, after going to bed around midnight in her normal state of health. She was actually doing really good, but at 4:00 a.m. in the morning, she was woken up with severe pain. The pain was located in the middle of her chest. There was no radiation. The pain felt like heaviness and increased pressure. The patient was not able to take really deep breaths. It lasted for 15 minutes. She had associated tingling of upper extremities that lasted also for 15 minutes and then went away. No nausea, vomiting, no problems. She states that she had significant improvement of her weakness after the stent was placed, but now, today, she could not walk from the bed for a couple of steps because she got really short of breath. The patient states that she started to have some pain here in the ER, but after the nitro paste, it got better. The patient is admitted because her troponin bumped up to 0.2. We are going to put her on heparin drip. Her hemoglobin dropped to 10, where her prior baseline was around 12, but all that is likely secondary to post cardiac catheterization bleeding and hematoma. The patient is starting to feel better, and we are going to admit her to telemetry.   REVIEW OF SYSTEMS: A 12-system review of systems is done.  CONSTITUTIONAL: No fever. The patient  was really fatigued prior to stent, and that resolved, but today, she could not walk from the bed to the car because of shortness of breath and fatigue. She needed to sit down in the middle of the floor. No weight loss or weight gain.  EYES: No blurry vision, double vision or glaucoma.  ENT: No tinnitus, ear pain or difficulty swallowing.  RESPIRATORY: No cough. No wheezing. No hemoptysis. Positive shortness of breath with ambulation.  CARDIOVASCULAR: Positive chest  pain. No orthopnea, no edema, no arrhythmias, no palpitations, no syncope.  GASTROINTESTINAL: No nausea, vomiting, abdominal pain, constipation or diarrhea.  GENITOURINARY: No dysuria, hematuria or changes in frequency.  GYNECOLOGIC: No breast masses.  ENDOCRINOLOGY: No polyuria, polydipsia or polyphagia, cold or heat intolerance. HEMATOLOGIC AND LYMPHATIC: No anemia, easy bruising or swollen glands.  SKIN: No rashes, petechiae. Has positive hematoma of the right thigh after cardiac catheterization.  MUSCULOSKELETAL: No significant neck pain, back pain, shoulder pain. No gout.  NEUROLOGIC: No numbness, tingling at this moment. She did have some tingling of her arms today during the chest pain, but it is gone. No migraines, CVAs or TIAs.  PSYCHIATRIC: No significant anxiety, insomnia or nervousness.   PAST MEDICAL HISTORY:  1. Coronary artery disease status post stent placement in the proximal RCA by Dr. Saralyn Pilar, 11/19/2012.  2. Hypertension.  3. Hyperlipidemia.  4. Sensitive to statins and ALLERGIC TO ASPIRIN.  5. Type 2 noninsulin-dependent diabetes.  6. Peripheral artery disease with carotid artery stenosis of 90%, on Plavix.   PAST SURGICAL HISTORY:  1. Hysterectomy.  2. Stent placement in the RCA in June 2014.   ALLERGIES: THE PATIENT IS ALLERGIC TO LISINOPRIL, INDOCIN AND ASPIRIN.   SOCIAL HISTORY: The patient used to smoke about 1 pack a day. She quit about 40 years ago. She drinks alcohol very infrequently. She denies any other drugs. She is married. She lives with her husband.   FAMILY HISTORY: Positive for cancer in multiple members, colon cancer in her mom, lung cancer in her father, ovarian cancer in her sister and leukemia in one of nieces. No active coronary artery disease in the family.   CURRENT MEDICATIONS:  1. Plavix 75 mg daily.  2. Prilosec 20 mg daily.  3. Metformin 500 mg twice daily.  4. Fluoxetine 40 mg daily.  5. Atenolol 25 mg daily. It is not on her list  of medications today, but it is on her discharge list. 6. Nitroglycerin p.r.n. pain.  7. Losartan 50 mg once daily.   PHYSICAL EXAMINATION:  VITAL SIGNS: Blood pressure 144/57, pulse 63, respirations 18, temperature 97.7, oxygen saturation 100% on room air.  GENERAL: The patient is alert and oriented x3, in no acute distress, no respiratory distress. Hemodynamically stable.  HEENT: Pupils are equal and reactive. Extraocular movements are intact. Mucosa is moist. Anicteric sclerae. Pink conjunctivae. No oral lesions. No nasal lesions. No ear lesions.  NECK: Supple. No JVD. No thyromegaly. No adenopathy. No carotid bruits. No masses. No rigidity.  CARDIOVASCULAR: Regular rate and rhythm. No murmurs, rubs or gallops. No reproduction of the pain with palpation of the chest. No displacement of PMI.  LUNGS: Clear, without any wheezing or crepitus. No use of accessory muscles. No dullness to percussion.  ABDOMEN: Soft, nontender, nondistended. No hepatosplenomegaly. No masses. Bowel sounds are positive. No rebound. No guarding.  GENITAL: Deferred.  EXTREMITIES: No edema, cyanosis or clubbing. Pulses +2. Capillary refill less than 3 on vascular.  NEUROLOGIC: Cranial nerves II through XII intact. Strength is  5 out of 5 in all 4 extremities.  PSYCHIATRIC: No significant agitation. The patient is alert and oriented x3, good judgment.  SKIN: No rashes or petechiae. Positive hematoma at level of the right thigh after heart catheterization. No signs of abscesses or induration.  MUSCULOSKELETAL: No significant joint deformity or joint effusions.  LYMPHATIC: Negative for lymphadenopathy in neck or supraclavicular areas.   DIAGNOSTIC STUDIES: EKG: No ST depression or elevation, left bundle branch block. There is a J-point elevation of 1 mm in V2, V3, and V4, which is difficult to evaluate based on her left bundle branch block. Her hemoglobin is 10.6, hemoglobin at discharge was 13.3. Her platelet count was 226  earlier; at this moment, I do not have results on that. Her troponin is 0.22; previous to discharge, her troponin was 0.1. Glucose 163, sodium 132, potassium 3.6, other electrolytes within normal limits.   ASSESSMENT AND PLAN: A 74 year old female with history of coronary artery disease, readmitted because of chest pain and elevated troponins today. The patient is a patient of Dr. Ubaldo Glassing. She was catheterized by Dr. Saralyn Pilar and discharged on the 1st of July.   1. Acute coronary syndrome/possible non-ST elevation myocardial infarction. The patient is admitted. Heparin drip is started. Guaiac is negative, as the patient had a drop in her hemoglobin from 13 to 10. This is likely secondary to bleeding and hematoma after the heart catheterization. The patient has been discharged on Plavix, and we will continue Plavix. SHE IS ALLERGIC TO ASPIRIN. The patient has a very high LDL of 144. We are going to continue pravastatin as the patient is sensitive to all the other statins. Beta blocker added on. The patient was supposed to be taking atenolol. We are going to change it to metoprolol and give it q.6 hours as the patient might be having acute coronary syndrome. Nitroglycerin patch. If pain continues, we are going to put her on nitro drip. Morphine p.r.n. pain. I am going to send a platelet inhibition test as the patient had chest pain and these problems on Plavix. Apparently, the patient is having a lot of clotting of her blood right now on the lab, for which I am going to send for anticardiolipin and lupus anticoagulant. Cardiology consultation to be obtained.  2. Hypertension. Now, adding on metoprolol. Continue losartan.  3. Dyslipidemia. The patient on pravastatin because she cannot tolerate any other statins.  4. Diabetes, seems to be well controlled. Insulin sliding scale and Accu-Cheks.  5. Deep vein thrombosis prophylaxis with heparin.  6. Gastrointestinal prophylaxis with Protonix.   TIME SPENT: I spent  about 60 minutes with this patient today.   ____________________________ Goldston Sink, MD rsg:OSi D: 11/26/2012 08:15:55 ET T: 11/26/2012 08:48:09 ET JOB#: [id number]  cc: [place] Sink, MD, <Dictator> [place] MD ELECTRONICALLY SIGNED 12/03/2012 16:38

## 2014-09-14 NOTE — H&P (Signed)
PATIENT NAME:  Susan Allen, Susan Allen MR#:  [id number] DATE OF BIRTH:  01-Feb-1941  DATE OF ADMISSION:  02/18/2013  REFERRING PHYSICIAN:  Dr. Lisa Roca.   PRIMARY CARDIOLOGY:  Dr. Ubaldo Glassing.   PRIMARY CARE PHYSICIAN:  Eulogio Bear, FNP.   CHIEF COMPLAINT:  Chest pain.   HISTORY OF PRESENT ILLNESS:  This is a 74 year old female with significant past medical history of coronary artery disease status post cardiac cath by Dr. Saralyn Pilar on 11/19/2012 and stent, as well readmitted in July with complaints of chest pain where she was diagnosed with non-ST-elevated MI,as well report she had reccent cardiac cath with stent placment at [place] in 01/16/2013 , with plavix changed to brilinta,  as well the patient has diagnosis of hypertension, diabetes, hyperlipidemia, GERD, peripheral vascular disease for which she takes Plavix for carotid artery stenosis, the patient presents today with complaints of chest pain, described it as burning/fire sensation, midsternal, radiating to both arms, was accompanied by some shortness of breath, nausea, diaphoresis, lightheadedness, reports these symptoms resembles her initial heart attack, THE PATIENT HAS ALLERGY TO ASPIRIN, she did not take any, she took some sublingual nitro at home, with resolution of her chest pain 5 to 10 minutes after that, she thinks it had nothing to do with sublingual nitro, in the ED, the patient had negative troponin x 1, her EKG was done which did show a left bundle branch block, which is old, currently the patient denies any chest pain or shortness of breath, she was given one dose of Lovenox 1 mg/kg out of concern of acute coronary syndrome, and hospitalist service requested to admit the patient for further work-up of her chest pain.   PAST MEDICAL HISTORY: 1.  Coronary artery disease, status post stent placement in proximal RCA by Dr. Saralyn Pilar on 11/19/2012 and non-STEMI in July 2014. and recent cath at [facility] at 01/16/2013 with stent placment. 2.   Hypertension.  3.  Hyperlipidemia.  4.  SENSITIVE TO STATIN AND ALLERGIC TO ASPIRIN.  5.  Type 2 diabetes mellitus.  6.  Peripheral artery disease with carotid artery stenosis of 90%, on Plavix.   PAST SURGICAL HISTORY: 1.  Hysterectomy.  2.  Stent placement in RCA in June 2014.   ALLERGIES:  THE PATIENT IS ALLERGIC TO LISINOPRIL, INDOCIN AND ASPIRIN.   SOCIAL HISTORY:  The patient quit smoking 40 years ago, no alcohol abuse.  No illicit drug use.  Married, lives at home with her husband.   FAMILY HISTORY:  Significant for cancer in multiple family members including colon cancer in her mother, lung cancer in her father, ovarian cancer in her sister and leukemia in one of her nieces.  No history of coronary artery disease at young age in the family.   HOME MEDICATIONS: 1.  Xanax 0.25 daily.  2.  Fluoxetine 40 mg daily.  3.  Isosorbide mononitrate extended release 30 mg oral daily.  4.  Losartan 50 mg daily.  5.  Metformin 500 mg oral 2 times a day.  6.  Metoprolol 25 mg by mouth twice daily.  7.  Sublingual nitro as needed.  8.  Pravastatin 10 mg at bedtime.  9.  Prilosec over-the-counter 20 mg oral daily.  10. Brilinta 90 mg po bid.  REVIEW OF SYSTEMS: CONSTITUTIONAL:  The patient denies fever, chills, fatigue, weakness, weight gain, weight loss.  EYES:  Denies blurry vision, double vision, inflammation, glaucoma.  EARS, NOSE, THROAT:  Denies tinnitus, ear pain, hearing loss, epistaxis or discharge.  RESPIRATORY:  Denies cough, wheezing, hemoptysis, painful respiratory, COPD.  Had episode of shortness of breath.  CARDIOVASCULAR:  Complains of chest pain, resolved.  Denies any edema, arrhythmia, syncope.  Had palpitations.  GASTROINTESTINAL:  Denies vomiting, diarrhea, abdominal pain, hematemesis, melena, rectal bleed, jaundice.  Had complaints of nausea.  GENITOURINARY:  Denies dysuria, hematuria, renal colic.  ENDOCRINE:  Denies polyuria, polydipsia, heat or cold intolerance.   HEMATOLOGY:  Denies anemia, easy bruising, bleeding diathesis.  INTEGUMENTARY:  Denies acne, rash or skin lesions.  MUSCULOSKELETAL:  Denies gout, arthritis or cramps.    NEUROLOGIC:  Denies CVA, TIA, dementia, headache.  PSYCHIATRIC:  Denies insomnia, schizophrenia, substance or alcohol abuse.  Has anxiety.   PHYSICAL EXAMINATION: VITAL SIGNS:  Temperature 98.3, pulse 65, respiratory rate 16, blood pressure 151/67, saturating 97% on room air.  GENERAL:  Elderly female who looks comfortable in bed in no apparent distress.  HEENT:  Head atraumatic, normocephalic.  Pupils equal, reactive to light.  Pink conjunctivae.  Anicteric sclerae.  Moist oral mucosa.  NECK:  Supple.  No thyromegaly.  No JVD.  CHEST:  Good air entry bilaterally.  No wheezing, rales or rhonchi.  CARDIOVASCULAR:  S1, S2 heard.  No rubs, murmur or gallops.  ABDOMEN:  Soft, nontender, nondistended.  Bowel sounds present.  EXTREMITIES:  No edema.  No clubbing.  No cyanosis.  PSYCHIATRIC:  Appropriate affect.  Awake, alert x 3.  Intact judgment and insight.  NEUROLOGIC:  Cranial nerves grossly intact.  Motor 5 out of 5.  No focal deficits.  Sensation is symmetrical and intact to light touch bilaterally.  LYMPHATICS:  No cervical or supraclavicular lymphadenopathy.  MUSCULOSKELETAL:  No joint effusion or erythema.  VASCULAR:  The patient had good distal pedal pulses.   PERTINENT LABORATORY DATA:  Glucose 222, BUN 16, creatinine 1.04, sodium 137, potassium 3.7, chloride 102, CO2 27, ALT 28, AST 25, alk phos 78.  Troponin less than 0.02.  White blood cells 11.4, hemoglobin 12.3, hematocrit 36.8, platelets 199.  Urinalysis negative for leukocyte esterase and nitrite.  EKG showing normal sinus rhythm with old left bundle branch block at 70 beats per minute.   ASSESSMENT AND PLAN: 1.  Chest pain, the patient is known to have history of coronary artery disease, her chest pain is concerning due to the fact it resembles the chest pain  during her last heart attack, so the patient will be given one dose of Lovenox 1 mg/kg, until she is ruled out, SHE HAS ALLERGY TO ASPIRIN so it cannot be given and we will admit her to telemetry, we will continue to cycle her cardiac enzymes.  We will consult her cardiologist Dr. Ubaldo Glassing who is very familiar with her to see if there is any further work-up indicated at this point, she is already on beta-blockers, losartan and statin and brilinta.  2.  Hypertension.  Blood pressure is acceptable.  We will continue her on metoprolol and losartan.  3.  Dyslipidemia.  We will continue her on pravastatin as she cannot tolerate any other statins.  4.  Diabetes mellitus.  We will hold her metformin.  We will start her on insulin sliding scale.  5.  History of peripheral artery disease with carotid artery stenosis.  We will continue her with brilinta.  Has no focal deficits.  6.  DVT prophylaxis.  The patient received one dose of Lovenox treatment dose.  We will await cardiology evaluation and the rest of her work-up prior to decide on continuing on prophylaxis versus  treatment dose.  7.  GI prophylaxis:  She is on Prilosec.  8.  CODE STATUS:  THE PATIENT IS A FULL CODE.  Does not have a LIVING WILL or healthcare power of attorney.   Total time spent on admission and patient care 60 minutes.    ____________________________ Albertine Patricia, MD dse:ea D: 02/18/2013 00:58:35 ET T: 02/18/2013 02:12:08 ET JOB#: [id number]  cc: Albertine Patricia, MD, <Dictator> Jerrold Haskell Graciela Husbands MD ELECTRONICALLY SIGNED 02/18/2013 5:43

## 2014-09-14 NOTE — Discharge Summary (Signed)
PATIENT NAME:  Susan Allen, Susan Allen MR#:  [id number] DATE OF BIRTH:  [birth year]  DATE OF ADMISSION:  02/18/2013 DATE OF DISCHARGE:  02/19/2013  For a detailed note, please see the history and physical done on admission by Dr. Waldron Labs.   DIAGNOSES AT DISCHARGE: Chest pain, likely related to chronic stable angina, history of coronary artery disease status post stent placement, diabetes, hypertension, depression, hyperlipidemia.   DIET: The patient is being discharged on [employer], low-sodium, low-fat diet.   ACTIVITY: As tolerated.   FOLLOW-UP: Dr. Jordan Hawks in the next 1 to 2 weeks.   DISCHARGE MEDICATIONS: Prozac 40 mg daily, Prilosec 20 mg daily, losartan 50 mg daily, sublingual nitroglycerin as needed, metformin 5 mg b.i.d., Pravachol 10 mg daily, metoprolol tartrate 25 mg b.i.d., Imdur 30 mg daily and Brillinta  90 mg daily.   [place] COURSE: Dr. Serafina Royals from cardiology.   PERTINENT STUDIES DONE DURING THE HOSPITAL COURSE: Are as follows: A chest x-ray done on admission showing no evidence of any acute cardiopulmonary disease.   HOSPITAL COURSE: This is a 74 year old female with medical problems as mentioned above, presented to the hospital with chest pain.  1. Chest pain. The patient does have significant risk factors for having coronary disease given her recent history of stent placement and coronary disease and diabetes. She was, therefore, observed overnight on telemetry, had three sets of cardiac markers checked which were negative. The patient is currently chest pain-free and hemodynamically stable. The patient was seen by cardiology. As per Dr. Nehemiah Massed, the patient is a high risk patient given her recent history of a complicated PCI to a right coronary artery, but because her markers are negative she needs to be managed more medically this time. The patient was on Ranexa, but although had some adverse reaction to it, therefore, is currently  not taking it. At this point, the patient will continue her aspirin, Brillinta, beta blocker, Imdur and statin and be discharged home with close follow-up with cardiology.  2. Diabetes. The patient's metformin was held in the hospital, although she will resume that upon discharge. For now, we will continue sliding scale insulin.  3. Hyperlipidemia. The patient was maintained on her Pravachol. She will resume that.  4. Depression. The patient was maintained on her Prozac. She will resume that.  5. Hypertension. The patient remained hemodynamically stable. She will continue her losartan, metoprolol and Imdur as stated.   CODE STATUS: THE PATIENT IS A FULL CODE.   TIME SPENT ON DISCHARGE: Is 35 minutes.   ____________________________ Belia Heman. Verdell Carmine, MD vjs:sg D: 02/19/2013 12:19:00 ET T: 02/19/2013 12:55:55 ET JOB#: [id number]  cc: Chrissie Noa A. Ubaldo Glassing, MD [place] Verdell Carmine, MD, <Dictator>   Henreitta Leber MD ELECTRONICALLY SIGNED 03/01/2013 19:22

## 2014-09-14 NOTE — Discharge Summary (Signed)
PATIENT NAME:  Susan Allen, Susan Allen MR#:  [id number] DATE OF BIRTH:  1940-11-16  DATE OF ADMISSION:  11/26/2012 DATE OF DISCHARGE:  11/28/2012  REASON FOR ADMISSION: Chest pain.   CARDIOLOGIST: Bartholome Bill, MD.  PRIMARY CARE PHYSICIAN:  Dani Gobble. White, FNP   DISCHARGE DIAGNOSES: 1.  Acute coronary syndrome unstable angina.  2.  Hypertension.  3.  Peripheral vascular disease.  4.  Controlled type 2 diabetes.  5.  Depression.  6.  Recent admission for non-ST elevation myocardial infarction. 7. Status post a stent placed on the right coronary artery proximal branch.   8.  Statin sensitive.   9.  Carotid artery stenosis, 90%.  10.  ASPIRIN ALLERGY.  HOSPITAL COURSE: Ms. Susan Allen is a very nice 74 year old female who has history of admission, 11/19/2012 to [facility] with chest pain found to be having a non-ST elevation MI. The patient at that moment was evaluated by Dr. Ubaldo Glassing who is her cardiologist and taken to the cath lab by Dr. Saralyn Pilar.  Please refer to the discharge summary done on July 1st for more detail. At that moment, the patient had normal troponin that got elevated after several hours. Her LDL was elevated at 144. She was not able to take aspirin for which she was discharged on Plavix but not on asa ( allergic ) as she had a cardiac stent anyway. Her ejection fraction was 50% to 55%. The patient was discharged from that admission and she went home, and did not have any trouble until the day of readmission.  She woke up at 4:00 a.m. in the morning after going to bed around midnight in her normal state of health. She woke up with some heaviness and increased pressure at the level of the chest, not able to take deep breaths that lasted for 15 minutes associated with tingling of both upper extremities. After about 15 minutes, it went away. No nausea or vomiting. No diaphoresis. The patient tried to get up and she could not walk because of the significant shortness of  breath. She did a couple of steps and then had to stop and she decided to come to the ER because of that. Her troponin was up to  0.2 and a Protonix drip was initiated.  PROBLEMS: 1.  Acute coronary syndrome. The patient was started on heparin, continuation of Plavix, continuation of  beta blocker. Cardiology was consulted. Dr. Clayborn Bigness evaluated the patient. He thought that she might need to have another heart cath at the beginning but by discharge they decided not to do anything else. Her troponin bumped up to 0.16 and then  started trending down to 0.13 at discharge. She was kept on nitroglycerin patch, p.r.n. morphine and b blocker .  Her blood pressure was overall controlled. Her chest pain was relieved after several hours of wearing her nicotine patch and by discharge she had no pain.    2.  The patient was advised to take her medications on a regular basis and also to start with gentle physical activity. Her hemoglobin at discharge was 10.9, white cells 9.7, platelets were normal. Her electrolytes were within normal limits.  Her glucose was around 100 to 130.  We did not repeat a lipid profile as she had it done on the previous admission.  Her magnesium was low at 1.5.   3.  The patient was discharged in good condition. As far as her other medical problems, she did not have any significant changes of medications  or exacerbations ofher chronic medical problems like peripheral vascular disease.  The patient had carotid artery stenosis and she was started on Plavix. She has depression. She was advised to continue fluoxetine.  I spent about 45 minutes on this discharged on the day of discharge.  ____________________________ Millwood Sink, MD rsg:dp D: 12/03/2012 07:12:00 ET T: 12/03/2012 10:09:49 ET JOB#: [id number]  cc:  Sink, MD, <Dictator> Javier Docker. Ubaldo Glassing, Porter White, FNP   Samarth Ogle America Brown MD ELECTRONICALLY SIGNED 12/04/2012 13:11

## 2014-09-21 ENCOUNTER — Other Ambulatory Visit: Payer: Self-pay | Admitting: Family Medicine

## 2014-09-21 DIAGNOSIS — Z1231 Encounter for screening mammogram for malignant neoplasm of breast: Secondary | ICD-10-CM

## 2014-09-24 IMAGING — CR DG CHEST 2V
1 series · 2 of 2 positions shown · non-contrast
Comparison: none

REASON FOR EXAM: Chest Pain
COMMENTS:

PROCEDURE:     DXR - DXR CHEST PA (OR AP) AND LATERAL  - November 19, 2012  [DATE]
RESULT:     The lungs are adequately inflated and clear. The cardiac
silhouette is normal in size. The mediastinum is normal in width. There is
no pleural effusion. The bony thorax exhibits no acute abnormality.

[Series 1: pa · 0.17mm/px · 2 of 2 slices shown]
[im 1/2]
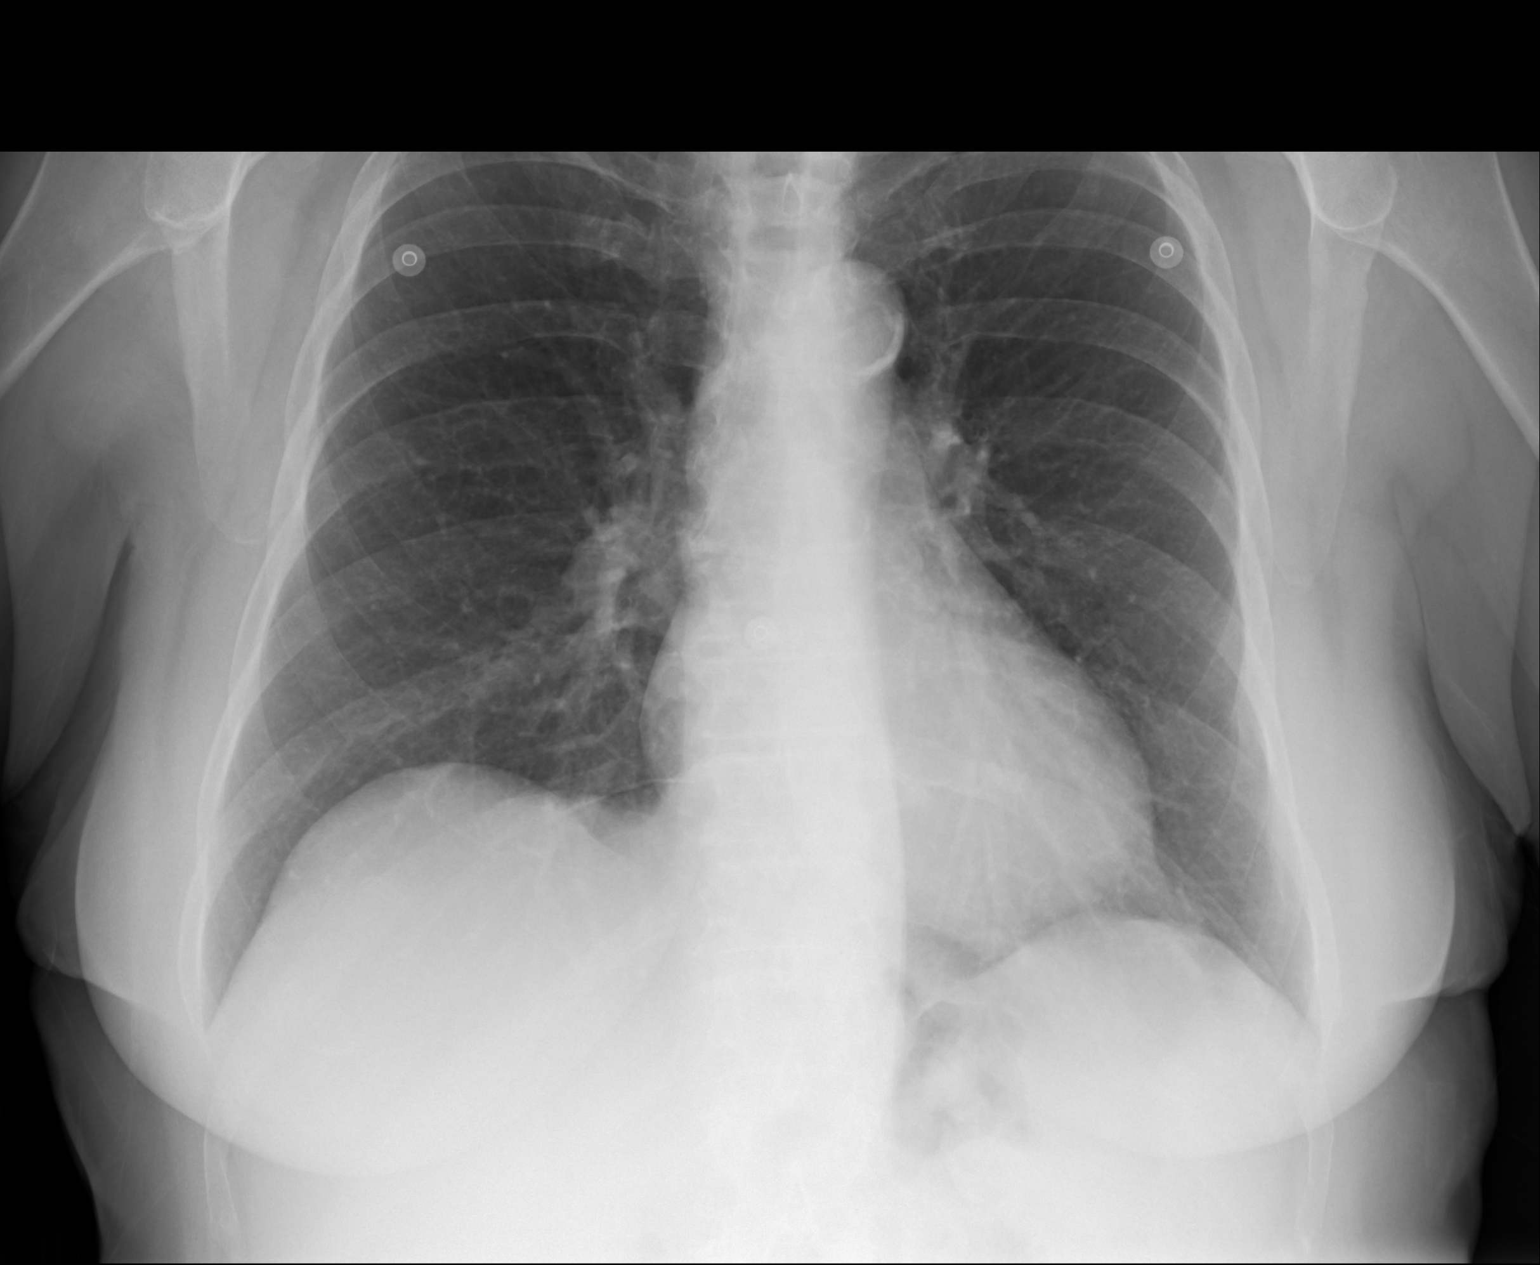
[im 2/2]
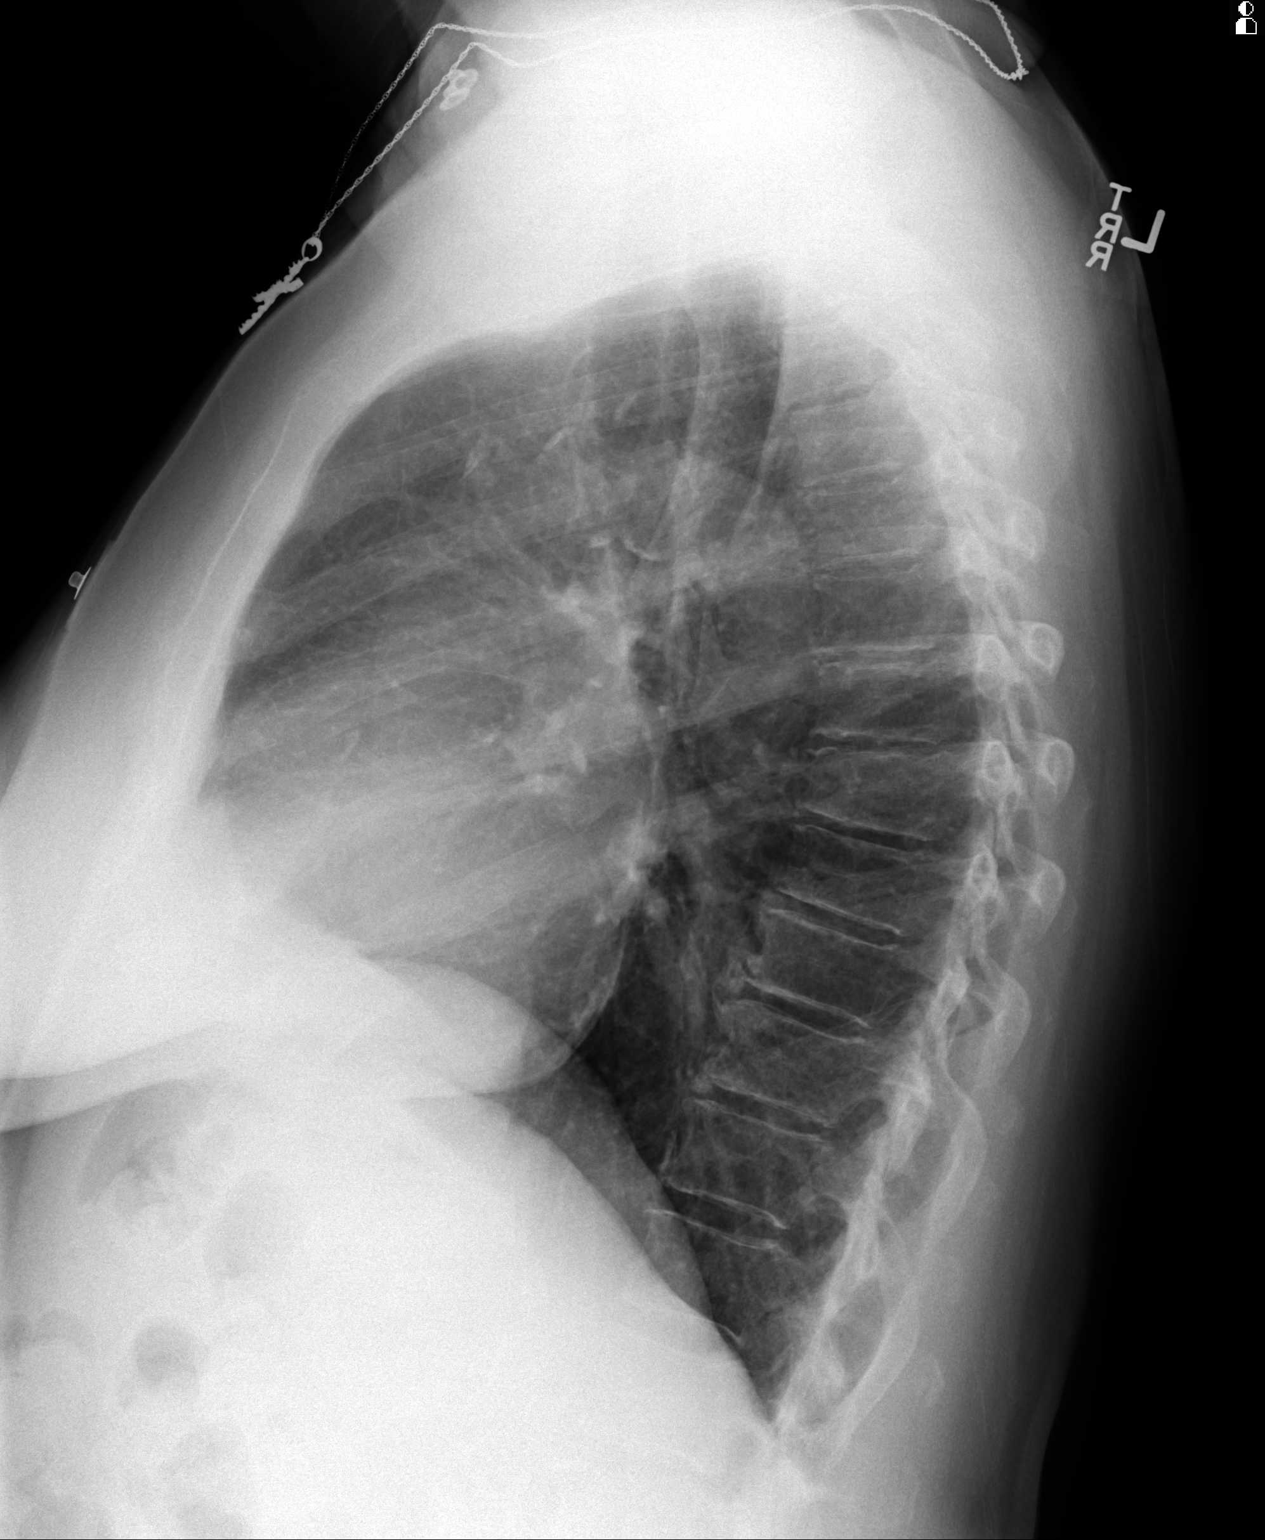

[2 of 2 positions shown; findings below may reference images not displayed]

IMPRESSION: There is no evidence of CHF nor pneumonia nor other acute
cardiopulmonary abnormality. Followup CT scanning may be useful if the
patient's symptoms warrant further evaluation.

[REDACTED]

## 2014-10-10 ENCOUNTER — Other Ambulatory Visit: Payer: Self-pay | Admitting: Family Medicine

## 2014-10-10 ENCOUNTER — Ambulatory Visit
Admission: RE | Admit: 2014-10-10 | Discharge: 2014-10-10 | Disposition: A | Discharge status: Home / Self Care | Payer: PPO | Source: Ambulatory Visit | Attending: Family Medicine | Admitting: Family Medicine

## 2014-10-10 DIAGNOSIS — Z1231 Encounter for screening mammogram for malignant neoplasm of breast: Secondary | ICD-10-CM

## 2014-10-25 DIAGNOSIS — F5101 Primary insomnia: Secondary | ICD-10-CM | POA: Insufficient documentation

## 2015-05-29 DIAGNOSIS — E782 Mixed hyperlipidemia: Secondary | ICD-10-CM | POA: Diagnosis not present

## 2015-05-29 DIAGNOSIS — E119 Type 2 diabetes mellitus without complications: Secondary | ICD-10-CM | POA: Diagnosis not present

## 2015-06-05 DIAGNOSIS — E119 Type 2 diabetes mellitus without complications: Secondary | ICD-10-CM | POA: Diagnosis not present

## 2015-06-05 DIAGNOSIS — E782 Mixed hyperlipidemia: Secondary | ICD-10-CM | POA: Diagnosis not present

## 2015-06-05 DIAGNOSIS — Z0000 Encounter for general adult medical examination without abnormal findings: Secondary | ICD-10-CM | POA: Diagnosis not present

## 2015-08-10 DIAGNOSIS — E119 Type 2 diabetes mellitus without complications: Secondary | ICD-10-CM | POA: Diagnosis not present

## 2015-08-20 DIAGNOSIS — G5761 Lesion of plantar nerve, right lower limb: Secondary | ICD-10-CM | POA: Diagnosis not present

## 2015-08-20 DIAGNOSIS — L600 Ingrowing nail: Secondary | ICD-10-CM | POA: Diagnosis not present

## 2015-08-20 DIAGNOSIS — M79672 Pain in left foot: Secondary | ICD-10-CM | POA: Diagnosis not present

## 2015-08-20 DIAGNOSIS — M79671 Pain in right foot: Secondary | ICD-10-CM | POA: Diagnosis not present

## 2015-08-23 DIAGNOSIS — E782 Mixed hyperlipidemia: Secondary | ICD-10-CM | POA: Diagnosis not present

## 2015-08-23 DIAGNOSIS — I2510 Atherosclerotic heart disease of native coronary artery without angina pectoris: Secondary | ICD-10-CM | POA: Diagnosis not present

## 2015-08-23 DIAGNOSIS — I10 Essential (primary) hypertension: Secondary | ICD-10-CM | POA: Diagnosis not present

## 2015-08-23 DIAGNOSIS — E119 Type 2 diabetes mellitus without complications: Secondary | ICD-10-CM | POA: Diagnosis not present

## 2015-08-23 DIAGNOSIS — I6522 Occlusion and stenosis of left carotid artery: Secondary | ICD-10-CM | POA: Diagnosis not present

## 2015-08-26 ENCOUNTER — Encounter
Admission: RE | Admit: 2015-08-26 | Discharge: 2015-08-26 | Disposition: A | Discharge status: Home / Self Care | Payer: PPO | Source: Ambulatory Visit | Attending: Podiatry | Admitting: Podiatry

## 2015-08-26 DIAGNOSIS — Z01812 Encounter for preprocedural laboratory examination: Secondary | ICD-10-CM | POA: Insufficient documentation

## 2015-08-26 DIAGNOSIS — M79671 Pain in right foot: Secondary | ICD-10-CM | POA: Diagnosis not present

## 2015-08-26 DIAGNOSIS — G5761 Lesion of plantar nerve, right lower limb: Secondary | ICD-10-CM | POA: Diagnosis not present

## 2015-08-26 DIAGNOSIS — M79672 Pain in left foot: Secondary | ICD-10-CM | POA: Diagnosis not present

## 2015-08-26 DIAGNOSIS — L600 Ingrowing nail: Secondary | ICD-10-CM | POA: Diagnosis not present

## 2015-08-26 HISTORY — DX: Unspecified osteoarthritis, unspecified site: M19.90

## 2015-08-26 HISTORY — DX: Atherosclerotic heart disease of native coronary artery without angina pectoris: I25.10

## 2015-08-26 HISTORY — DX: Type 2 diabetes mellitus without complications: E11.9

## 2015-08-26 HISTORY — DX: Essential (primary) hypertension: I10

## 2015-08-26 HISTORY — DX: Bronchitis, not specified as acute or chronic: J40

## 2015-08-26 HISTORY — DX: Headache, unspecified: R51.9

## 2015-08-26 HISTORY — DX: Gastro-esophageal reflux disease without esophagitis: K21.9

## 2015-08-26 HISTORY — DX: Acute myocardial infarction, unspecified: I21.9

## 2015-08-26 HISTORY — DX: Headache: R51

## 2015-08-26 LAB — BASIC METABOLIC PANEL
ANION GAP: 6 (ref 5–15)
BUN: 19 mg/dL (ref 6–20)
CO2: 27 mmol/L (ref 22–32)
Calcium: 9.2 mg/dL (ref 8.9–10.3)
Chloride: 102 mmol/L (ref 101–111)
Creatinine, Ser: 0.76 mg/dL (ref 0.44–1.00)
Glucose, Bld: 119 mg/dL — ABNORMAL HIGH (ref 65–99)
POTASSIUM: 3.7 mmol/L (ref 3.5–5.1)
SODIUM: 135 mmol/L (ref 135–145)

## 2015-08-26 LAB — HEMOGLOBIN: HEMOGLOBIN: 12.1 g/dL (ref 12.0–16.0)

## 2015-08-26 NOTE — Pre-Procedure Instructions (Signed)
Anesthesia See Note Component Name Value Range  Vent Rate (bpm) 60   PR Interval (msec) 162   QRS Interval (msec) 140   QT Interval (msec) 448   QTc (msec) 448    Result Narrative  Normal sinus rhythm Left bundle branch block Abnormal ECG When compared with ECG of 15-Oct-2014 10:24, No significant change was found I reviewed and concur with this report. Electronically signed [id number] MD, KEN [id number]) on 04/30/2015 7:27:45 PM   Status Results Details

## 2015-08-26 NOTE — Patient Instructions (Signed)
  Your procedure is scheduled [id number] 7, 2017 (Friday) Report to Day Surgery.[facility]) Second Floor To find out your arrival time please call [phone] between 1PM - 3PM on August 29, 2015 (Thursday)  Remember: Instructions that are not followed completely may result in serious medical risk, up to and including death, or upon the discretion of your surgeon and anesthesiologist your surgery may need to be rescheduled.    __x_ 1. Do not eat food or drink liquids after midnight. No gum chewing or hard candies.     __x_ 2. No Alcohol for 24 hours before or after surgery.   ____ 3. Bring all medications with you on the day of surgery if instructed.    __x_ 4. Notify your doctor if there is any change in your medical condition     (cold, fever, infections).     Do not wear jewelry, make-up, hairpins, clips or nail polish.  Do not wear lotions, powders, or perfumes. You may wear deodorant.  Do not shave 48 hours prior to surgery. Men may shave face and neck.  Do not bring valuables to the hospital.    [facility] is not responsible for any belongings or valuables.               Contacts, dentures or bridgework may not be worn into surgery.  Leave your suitcase in the car. After surgery it may be brought to your room.  For patients admitted to the hospital, discharge time is determined by your                treatment team.   Patients discharged the day of surgery will not be allowed to drive home.   Please read over the following fact sheets that you were given:   Surgical Site Infection Prevention   __x_ Take these medicines the morning of surgery with A SIP OF WATER:    1. Isosorbide Mononitrate  2. Losartan  3. Metoprolol  4.Zantac (Zantac at bedtime Thursday night)  6.  ____ Fleet Enema (as directed)   __x Use CHG Soap as directed  __x_ Use inhalers on the day of surgery (Use Albuterol inhaler the morning of surgery and bring to hospital)  __x_ Stop metformin 2 days  prior to surgery (Stop Metformin on April 5)  ____ Take 1/2 of usual insulin dose the night before surgery and none on the morning of surgery.    __x Stop Coumadin/Plavix/aspirin on(Stop Ticagrelor on April 4 per Dr. Ubaldo Glassing)  __x_ Stop Anti-inflammatories on (NO NSAIDS)   ____ Stop supplements until after surgery.    ____ Bring C-Pap to the hospital.

## 2015-08-26 NOTE — Pre-Procedure Instructions (Signed)
Cardiac clearance received from Dr. Ubaldo Glassing , and stated patient is at low risk for cardiovascular complications.

## 2015-08-26 NOTE — Pre-Procedure Instructions (Signed)
Result Impression  Negative Lexiscan stress with left bundle.  No evidence of ischemia noted  on sestamibi images.  LV function normal   Result Narrative  Procedure: Pharmacologic Myocardial Perfusion Imaging   ONE day procedure  Indication: Atypical chest pain - Plan: NM myocardial perfusion SPECT  multiple (stress and rest), ECG stress test only Ordering Physician:   Dr. Bartholome Bill   Clinical History: 75 y.o. year old female Vitals: Height: 68 in Weight: 200 lb Cardiac risk factors include:    CAS, Hyperlipidemia, Diabetes, HTN and CAD    Procedure:  Pharmacologic stress testing was performed with Regadenoson using a single  use 0.4mg /76ml (0.08 mg/ml) prefilled syringe intravenously infused as a  bolus dose. The stress test was stopped due to Infusion completion.  Blood  pressure response was normal.   Rest HR: 62bpm Rest BP: 138/33mmHg Max HR: 93bpm Min BP: 148/26mmHg  Stress Test Administered by: Oswald Hillock, CMA  ECG Interpretation: Rest ECG:  normal sinus rhythm, left bundle branch block (LBBB) Stress ECG:  normal sinus rhythm, no arrhythmias  Recovery ECG:  normal sinus rhythm ECG Interpretation:  non-diagnostic due to pharmacologic testing.   Administrations This Visit    regadenoson (LEXISCAN) 0.4 mg/5 mL inj syringe 0.4 mg    Admin Date Action Dose Route Administered By      10/17/2014 Given 0.4 mg Intravenous Sharmon Leyden Deangelo            technetium Tc31m sestamibi (CARDIOLITE) injection 123XX123 millicurie    Admin Date Action Dose Route Administered By      [id number] Given 123XX123 millicurie Intravenous Vinnie Level L Deangelo            technetium Tc10m sestamibi (CARDIOLITE) injection [id number] millicurie    Admin Date Action Dose Route Administered By      10/17/2014 Given [id number] millicurie Intravenous Joshua B Hyler, CNMT              Gated post-stress perfusion imaging was performed 30 minutes after stress.  Rest images were  performed 30 minutes after injection.  Gated LV Analysis:  TID Ratio: 0.79  LVEF= 62%  FINDINGS: Regional wall motion:  reveals normal myocardial thickening and wall  motion. The overall quality of the study is fair.   Artifacts noted: no Left ventricular cavity: normal.  Perfusion Analysis:  SPECT images demonstrate homogeneous tracer  distribution throughout the myocardium.

## 2015-08-30 ENCOUNTER — Ambulatory Visit: Payer: PPO | Admitting: Anesthesiology

## 2015-08-30 ENCOUNTER — Encounter
Admission: RE | Disposition: A | Discharge status: Home / Self Care | Payer: Self-pay | Source: Ambulatory Visit | Attending: Podiatry

## 2015-08-30 ENCOUNTER — Ambulatory Visit
Admission: RE | Admit: 2015-08-30 | Discharge: 2015-08-30 | Disposition: A | Discharge status: Home / Self Care | Payer: PPO | Source: Ambulatory Visit | Attending: Podiatry | Admitting: Podiatry

## 2015-08-30 ENCOUNTER — Encounter: Payer: Self-pay | Admitting: *Deleted

## 2015-08-30 DIAGNOSIS — Z886 Allergy status to analgesic agent status: Secondary | ICD-10-CM | POA: Diagnosis not present

## 2015-08-30 DIAGNOSIS — G5761 Lesion of plantar nerve, right lower limb: Secondary | ICD-10-CM | POA: Diagnosis not present

## 2015-08-30 DIAGNOSIS — Z79899 Other long term (current) drug therapy: Secondary | ICD-10-CM | POA: Diagnosis not present

## 2015-08-30 DIAGNOSIS — Z87891 Personal history of nicotine dependence: Secondary | ICD-10-CM | POA: Diagnosis not present

## 2015-08-30 DIAGNOSIS — I2510 Atherosclerotic heart disease of native coronary artery without angina pectoris: Secondary | ICD-10-CM | POA: Diagnosis not present

## 2015-08-30 DIAGNOSIS — E119 Type 2 diabetes mellitus without complications: Secondary | ICD-10-CM | POA: Diagnosis not present

## 2015-08-30 DIAGNOSIS — L600 Ingrowing nail: Secondary | ICD-10-CM | POA: Diagnosis not present

## 2015-08-30 DIAGNOSIS — M1990 Unspecified osteoarthritis, unspecified site: Secondary | ICD-10-CM | POA: Diagnosis not present

## 2015-08-30 DIAGNOSIS — Z7984 Long term (current) use of oral hypoglycemic drugs: Secondary | ICD-10-CM | POA: Insufficient documentation

## 2015-08-30 DIAGNOSIS — Z888 Allergy status to other drugs, medicaments and biological substances status: Secondary | ICD-10-CM | POA: Diagnosis not present

## 2015-08-30 DIAGNOSIS — K219 Gastro-esophageal reflux disease without esophagitis: Secondary | ICD-10-CM | POA: Insufficient documentation

## 2015-08-30 DIAGNOSIS — G8929 Other chronic pain: Secondary | ICD-10-CM | POA: Diagnosis not present

## 2015-08-30 DIAGNOSIS — I10 Essential (primary) hypertension: Secondary | ICD-10-CM | POA: Insufficient documentation

## 2015-08-30 DIAGNOSIS — M79671 Pain in right foot: Secondary | ICD-10-CM | POA: Diagnosis not present

## 2015-08-30 HISTORY — PX: DIGIT NAIL REMOVAL: SHX5052

## 2015-08-30 HISTORY — PX: EXCISION MORTON'S NEUROMA: SHX5013

## 2015-08-30 LAB — GLUCOSE, CAPILLARY
GLUCOSE-CAPILLARY: 143 mg/dL — AB (ref 65–99)
GLUCOSE-CAPILLARY: 135 mg/dL — AB (ref 65–99)

## 2015-08-30 SURGERY — EXCISION, MORTON'S NEUROMA
Anesthesia: General | Laterality: Right | Wound class: Clean

## 2015-08-30 MED ORDER — BACITRACIN-NEOMYCIN-POLYMYXIN 400-5-5000 EX OINT
TOPICAL_OINTMENT | CUTANEOUS | Status: AC
  Filled 2015-08-30: qty 2

## 2015-08-30 MED ORDER — LIDOCAINE HCL (PF) 1 % IJ SOLN
INTRAMUSCULAR | Status: AC
  Filled 2015-08-30: qty 30

## 2015-08-30 MED ORDER — LIDOCAINE HCL 1 % IJ SOLN
INTRAMUSCULAR | Status: DC | PRN
  Administered 2015-08-30: 4.5 mL

## 2015-08-30 MED ORDER — CEFAZOLIN SODIUM-DEXTROSE 2-4 GM/100ML-% IV SOLN
INTRAVENOUS | Status: AC
  Administered 2015-08-30: 2 g via INTRAVENOUS
  Filled 2015-08-30: qty 100

## 2015-08-30 MED ORDER — BUPIVACAINE HCL (PF) 0.5 % IJ SOLN
INTRAMUSCULAR | Status: AC
  Filled 2015-08-30: qty 30

## 2015-08-30 MED ORDER — BUPIVACAINE HCL 0.5 % IJ SOLN
INTRAMUSCULAR | Status: DC | PRN
  Administered 2015-08-30: 4.5 mL
  Administered 2015-08-30: 4 mL

## 2015-08-30 MED ORDER — LIDOCAINE-EPINEPHRINE 1 %-1:100000 IJ SOLN
INTRAMUSCULAR | Status: DC | PRN
  Administered 2015-08-30: 3 mL

## 2015-08-30 MED ORDER — EPHEDRINE SULFATE 50 MG/ML IJ SOLN
INTRAMUSCULAR | Status: DC | PRN
  Administered 2015-08-30 (×2): 5 mg via INTRAVENOUS

## 2015-08-30 MED ORDER — FENTANYL CITRATE (PF) 100 MCG/2ML IJ SOLN: 25.0000 ug | INTRAMUSCULAR | Status: DC | PRN

## 2015-08-30 MED ORDER — LIDOCAINE-EPINEPHRINE 1 %-1:100000 IJ SOLN
INTRAMUSCULAR | Status: AC
  Filled 2015-08-30: qty 1

## 2015-08-30 MED ORDER — PROPOFOL 10 MG/ML IV BOLUS
INTRAVENOUS | Status: DC | PRN
  Administered 2015-08-30: 30 mg via INTRAVENOUS

## 2015-08-30 MED ORDER — PHENYLEPHRINE HCL 10 MG/ML IJ SOLN
INTRAMUSCULAR | Status: DC | PRN
  Administered 2015-08-30: 50 ug via INTRAVENOUS

## 2015-08-30 MED ORDER — FENTANYL CITRATE (PF) 100 MCG/2ML IJ SOLN
INTRAMUSCULAR | Status: DC | PRN
  Administered 2015-08-30: 25 ug via INTRAVENOUS
  Administered 2015-08-30: 50 ug via INTRAVENOUS
  Administered 2015-08-30: 25 ug via INTRAVENOUS

## 2015-08-30 MED ORDER — MIDAZOLAM HCL 2 MG/2ML IJ SOLN
INTRAMUSCULAR | Status: DC | PRN
  Administered 2015-08-30 (×2): 1 mg via INTRAVENOUS

## 2015-08-30 MED ORDER — PROPOFOL 500 MG/50ML IV EMUL
INTRAVENOUS | Status: DC | PRN
  Administered 2015-08-30: 60 ug/kg/min via INTRAVENOUS

## 2015-08-30 MED ORDER — CEFAZOLIN SODIUM-DEXTROSE 2-4 GM/100ML-% IV SOLN
2.0000 g | Freq: Once | INTRAVENOUS | Status: AC
  Administered 2015-08-30: 2 g via INTRAVENOUS

## 2015-08-30 MED ORDER — OXYCODONE-ACETAMINOPHEN 7.5-325 MG PO TABS: 1.0000 | ORAL_TABLET | ORAL | Status: DC | PRN

## 2015-08-30 MED ORDER — SODIUM CHLORIDE 0.9 % IV SOLN
INTRAVENOUS | Status: DC
  Administered 2015-08-30: 07:00:00 via INTRAVENOUS

## 2015-08-30 MED ORDER — ONDANSETRON HCL 4 MG/2ML IJ SOLN: 4.0000 mg | Freq: Once | INTRAMUSCULAR | Status: DC | PRN

## 2015-08-30 SURGICAL SUPPLY — 35 items
BAG COUNTER SPONGE EZ (MISCELLANEOUS) ×3 IMPLANT
BANDAGE STRETCH 3X4.1 STRL (GAUZE/BANDAGES/DRESSINGS) ×8 IMPLANT
BENZOIN TINCTURE PRP APPL 2/3 (GAUZE/BANDAGES/DRESSINGS) ×4 IMPLANT
BLADE SURG 15 STRL LF DISP TIS (BLADE) ×4 IMPLANT
BLADE SURG 15 STRL SS (BLADE) ×4
BLADE SURG MINI STRL (BLADE) ×4 IMPLANT
BNDG ESMARK 4X12 TAN STRL LF (GAUZE/BANDAGES/DRESSINGS) ×4 IMPLANT
BNDG GAUZE 4.5X4.1 6PLY STRL (MISCELLANEOUS) ×4 IMPLANT
CLOSURE WOUND 1/4X4 (GAUZE/BANDAGES/DRESSINGS) ×1
COUNTER SPONGE BAG EZ (MISCELLANEOUS) ×1
CUFF TOURN 18 STER (MISCELLANEOUS) ×4 IMPLANT
CUFF TOURN DUAL PL 12 NO SLV (MISCELLANEOUS) ×4 IMPLANT
DRAIN PENROSE 1/4X12 LTX (DRAIN) ×4 IMPLANT
DRAPE IMP U-DRAPE 54X76 (DRAPES) ×4 IMPLANT
DURAPREP 26ML APPLICATOR (WOUND CARE) ×4 IMPLANT
ELECT REM PT RETURN 9FT ADLT (ELECTROSURGICAL) ×4
ELECTRODE REM PT RTRN 9FT ADLT (ELECTROSURGICAL) ×2 IMPLANT
GAUZE PETRO XEROFOAM 1X8 (MISCELLANEOUS) ×4 IMPLANT
GAUZE SPONGE 4X4 12PLY STRL (GAUZE/BANDAGES/DRESSINGS) ×4 IMPLANT
GAUZE STRETCH 2X75IN STRL (MISCELLANEOUS) ×4 IMPLANT
GLOVE BIO SURGEON STRL SZ8 (GLOVE) ×4 IMPLANT
GLOVE INDICATOR 7.5 STRL GRN (GLOVE) ×8 IMPLANT
GOWN STRL REUS W/ TWL LRG LVL3 (GOWN DISPOSABLE) ×4 IMPLANT
GOWN STRL REUS W/TWL LRG LVL3 (GOWN DISPOSABLE) ×4
KIT RM TURNOVER STRD PROC AR (KITS) ×4 IMPLANT
LABEL OR SOLS (LABEL) ×4 IMPLANT
NEEDLE FILTER BLUNT 18X 1/2SAF (NEEDLE) ×2
NEEDLE FILTER BLUNT 18X1 1/2 (NEEDLE) ×2 IMPLANT
NEEDLE HYPO 25X1 1.5 SAFETY (NEEDLE) ×12 IMPLANT
NS IRRIG 500ML POUR BTL (IV SOLUTION) ×4 IMPLANT
PACK EXTREMITY ARMC (MISCELLANEOUS) ×4 IMPLANT
PENCIL ELECTRO HAND CTR (MISCELLANEOUS) ×4 IMPLANT
STOCKINETTE STRL 6IN 960660 (GAUZE/BANDAGES/DRESSINGS) ×4 IMPLANT
STRIP CLOSURE SKIN 1/4X4 (GAUZE/BANDAGES/DRESSINGS) ×3 IMPLANT
SUT VIC AB 4-0 FS2 27 (SUTURE) ×4 IMPLANT

## 2015-08-30 NOTE — Discharge Instructions (Addendum)
AMBULATORY SURGERY  DISCHARGE INSTRUCTIONS   1) The drugs that you were given will stay in your system until tomorrow so for the next 24 hours you should not:  A) Drive an automobile B) Make any legal decisions C) Drink any alcoholic beverage   2) You may resume regular meals tomorrow.  Today it is better to start with liquids and gradually work up to solid foods.  You may eat anything you prefer, but it is better to start with liquids, then soup and crackers, and gradually work up to solid foods.   3) Please notify your doctor immediately if you have any unusual bleeding, trouble breathing, redness and pain at the surgery site, drainage, fever, or pain not relieved by medication.    4) Additional Instructions:        Please contact your physician with any problems or Same Day Surgery at [phone], Monday through Friday 6 am to 4 pm, or [place] at [facility] number at [phone].AMBULATORY SURGERY  DISCHARGE INSTRUCTIONS   5) The drugs that you were given will stay in your system until tomorrow so for the next 24 hours you should not:  D) Drive an automobile E) Make any legal decisions F) Drink any alcoholic beverage   6) You may resume regular meals tomorrow.  Today it is better to start with liquids and gradually work up to solid foods.  You may eat anything you prefer, but it is better to start with liquids, then soup and crackers, and gradually work up to solid foods.   7) Please notify your doctor immediately if you have any unusual bleeding, trouble breathing, redness and pain at the surgery site, drainage, fever, or pain not relieved by medication.    8) Additional Instructions: Tomorrow remove dressing from left great toe.  Soak in 2 tsps of epsom salts in 1 qt of warm water for 15 minutes.  Pat dry and apply triple antibiotic ointment with a bandaid.  Leave right foot dressings intact.        Please contact your physician with any problems  or Same Day Surgery at [phone], Monday through Friday 6 am to 4 pm, or [place] at [facility] number at [phone].

## 2015-08-30 NOTE — Anesthesia Preprocedure Evaluation (Signed)
Anesthesia Evaluation  Patient identified by MRN, date of birth, ID band Patient awake    Reviewed: Allergy & Precautions, NPO status , Patient's Chart, lab work & pertinent test results  History of Anesthesia Complications Negative for: history of anesthetic complications  Airway Mallampati: III       Dental   Pulmonary neg pulmonary ROS, former smoker,           Cardiovascular hypertension, Pt. on medications and Pt. on home beta blockers + CAD, + Past MI and + Cardiac Stents       Neuro/Psych Anxiety negative neurological ROS     GI/Hepatic negative GI ROS, Neg liver ROS, GERD  Medicated,  Endo/Other  diabetes, Type 2, Oral Hypoglycemic Agents  Renal/GU negative Renal ROS     Musculoskeletal  (+) Arthritis ,   Abdominal   Peds  Hematology   Anesthesia Other Findings   Reproductive/Obstetrics                             Anesthesia Physical Anesthesia Plan  ASA: III  Anesthesia Plan: General   Post-op Pain Management:    Induction: Intravenous  Airway Management Planned: LMA  Additional Equipment:   Intra-op Plan:   Post-operative Plan:   Informed Consent: I have reviewed the patients History and Physical, chart, labs and discussed the procedure including the risks, benefits and alternatives for the proposed anesthesia with the patient or authorized representative who has indicated his/her understanding and acceptance.     Plan Discussed with:   Anesthesia Plan Comments:         Anesthesia Quick Evaluation

## 2015-08-30 NOTE — Op Note (Signed)
Operative note   Surgeon: Dr. Albertine Patricia, DPM.    Assistant: None    Preop diagnosis: 1. Morton's neuroma right foot                                  2. Ingrown toenail both borders both great toes.    Postop diagnosis: Same    Procedure:   1. Excision Morton's neuroma right foot   2. Excision of nail and matrix with phenol and alcohol procedure both borders right great toe   3. Excision nail matrix with phenol and alcohol procedure both borders left great toe     EBL: Less than 10 cc     Anesthesia:IV sedation delivered by the anesthesia team local anesthesia delivered by me    Hemostasis: Ankle tourniquet 250 mils mercury pressure for 9 minutes to the right ankle. Penrose drain tourniquets to both great toes for 10 minutes    Specimen: Morton's neuroma from right third metatarsal space    Complications: None    Operative indications: Chronic pain unresponsive to conservative care    Procedure:  Patient was brought into the OR and placed on the operating table in thesupine position. After anesthesia was obtained theright lower extremity was prepped and draped in usual sterile fashion.  Operative Report: At this time attention was directed to the dorsum of the right foot over the third metatarsal space where a 3.5 cm linear incision was made. This was deepened with sharp blunt dissection bleeders clamped and bovied as required. The intermetatarsal ligament was notified and incised and plantar pressure then delivered a large Morton's neuroma into the intermetatarsal space and surgical field. This was traced to its distal branches to the third toe and the fourth toe released from these areas respectively. The neuroma was then freed plantarly and traced to the most proximal portion where returned back to normal size. Intermetatarsal ligament was released over this area. The neuroma was then removed and sent to pathology for evaluation. After copious irrigation the tourniquet was  released to check for any bleeders none of which were seen. 4 Vicryl's and used to close deep and superficial fascial layers and skin was closed with 4 Vicryl subcuticular stitch.  A sterile compressive dressing was applied across the area consisting of Steri-Strips Xeroform gauze 4 x 4's and Kling.  This time attention was directed to the right and left great toes were Penrose drain tourniquets were placed at the base of the digits. Both borders of both great toenails were then removed and phenol was applied for 3 applications 60 seconds each application into the tibiofibular margins of both right and left great toes. Once this was accomplished the borders were irrigated copiously with alcohol and then dressed with Neosporin and a sterile compressive dressing. Tourniquets were removed and prompt complete vascularity seen to return to the great digits.    Patient tolerated the procedure and anesthesia well.  Was transported from the OR to the PACU with all vital signs stable and vascular status intact. To be discharged per routine protocol.  Will follow up in approximately 1 week in the outpatient clinic.

## 2015-08-30 NOTE — Progress Notes (Signed)
Pt bilateral toes dressing reinforced with 2x2 gauze and kerlix and secured with tape. Per Dr Elvina Mattes order.

## 2015-08-30 NOTE — H&P (Signed)
H and P has been reviewed and no changes are noted.  

## 2015-08-30 NOTE — Anesthesia Postprocedure Evaluation (Signed)
Anesthesia Post Note  Patient: Susan Allen  Procedure(s) Performed: Procedure(s) (LRB): EXCISION MORTON'S NEUROMA (Right) REMOVAL OF DIGIT NAILS/ BIL. PERM.REMOVAL INGROWN GREAT TOE NAILS (Bilateral)  Patient location during evaluation: PACU Anesthesia Type: General Level of consciousness: awake and alert Pain management: pain level controlled Vital Signs Assessment: post-procedure vital signs reviewed and stable Respiratory status: spontaneous breathing and respiratory function stable Cardiovascular status: stable Anesthetic complications: no    Last Vitals:  Filed Vitals:   08/30/15 0942 08/30/15 1000  BP: 170/47 178/49  Pulse: 52 54  Temp: 35.9 C   Resp: 16     Last Pain: There were no vitals filed for this visit.               Takyla Kuchera K

## 2015-08-30 NOTE — Transfer of Care (Signed)
Immediate Anesthesia Transfer of Care Note  Patient: Susan Allen  Procedure(s) Performed: Procedure(s): EXCISION MORTON'S NEUROMA (Right) REMOVAL OF DIGIT NAILS/ BIL. PERM.REMOVAL INGROWN GREAT TOE NAILS (Bilateral)  Patient Location: PACU  Anesthesia Type:General  Level of Consciousness: awake, alert  and oriented  Airway & Oxygen Therapy: Patient Spontanous Breathing  Post-op Assessment: Report given to RN and Post -op Vital signs reviewed and stable  Post vital signs: Reviewed and stable  Last Vitals:  Filed Vitals:   08/30/15 0629  BP: 157/47  Pulse: 53  Temp: 36.5 C  Resp: 16    Complications: No apparent anesthesia complications

## 2015-09-02 LAB — SURGICAL PATHOLOGY

## 2015-09-25 DIAGNOSIS — E782 Mixed hyperlipidemia: Secondary | ICD-10-CM | POA: Diagnosis not present

## 2015-09-25 DIAGNOSIS — Z0000 Encounter for general adult medical examination without abnormal findings: Secondary | ICD-10-CM | POA: Diagnosis not present

## 2015-09-25 DIAGNOSIS — E119 Type 2 diabetes mellitus without complications: Secondary | ICD-10-CM | POA: Diagnosis not present

## 2015-10-07 DIAGNOSIS — F5101 Primary insomnia: Secondary | ICD-10-CM | POA: Diagnosis not present

## 2015-10-07 DIAGNOSIS — I10 Essential (primary) hypertension: Secondary | ICD-10-CM | POA: Diagnosis not present

## 2015-10-07 DIAGNOSIS — F329 Major depressive disorder, single episode, unspecified: Secondary | ICD-10-CM | POA: Diagnosis not present

## 2015-10-07 DIAGNOSIS — E782 Mixed hyperlipidemia: Secondary | ICD-10-CM | POA: Diagnosis not present

## 2015-10-07 DIAGNOSIS — E119 Type 2 diabetes mellitus without complications: Secondary | ICD-10-CM | POA: Diagnosis not present

## 2015-10-15 DIAGNOSIS — M10071 Idiopathic gout, right ankle and foot: Secondary | ICD-10-CM | POA: Diagnosis not present

## 2015-11-04 ENCOUNTER — Other Ambulatory Visit: Payer: Self-pay | Admitting: Family Medicine

## 2015-11-04 DIAGNOSIS — Z1231 Encounter for screening mammogram for malignant neoplasm of breast: Secondary | ICD-10-CM

## 2015-11-09 DIAGNOSIS — E119 Type 2 diabetes mellitus without complications: Secondary | ICD-10-CM | POA: Diagnosis not present

## 2015-11-19 ENCOUNTER — Ambulatory Visit: Payer: PPO

## 2015-12-06 ENCOUNTER — Ambulatory Visit
Admission: RE | Admit: 2015-12-06 | Discharge: 2015-12-06 | Disposition: A | Discharge status: Home / Self Care | Payer: PPO | Source: Ambulatory Visit | Attending: Family Medicine | Admitting: Family Medicine

## 2015-12-06 ENCOUNTER — Other Ambulatory Visit: Payer: Self-pay | Admitting: Family Medicine

## 2015-12-06 DIAGNOSIS — Z1231 Encounter for screening mammogram for malignant neoplasm of breast: Secondary | ICD-10-CM

## 2015-12-16 ENCOUNTER — Other Ambulatory Visit: Payer: Self-pay | Admitting: Nurse Practitioner

## 2015-12-16 DIAGNOSIS — R1011 Right upper quadrant pain: Secondary | ICD-10-CM

## 2015-12-16 DIAGNOSIS — K2270 Barrett's esophagus without dysplasia: Secondary | ICD-10-CM | POA: Diagnosis not present

## 2015-12-16 DIAGNOSIS — R1013 Epigastric pain: Secondary | ICD-10-CM

## 2015-12-16 DIAGNOSIS — K219 Gastro-esophageal reflux disease without esophagitis: Secondary | ICD-10-CM | POA: Diagnosis not present

## 2015-12-18 ENCOUNTER — Ambulatory Visit
Admission: RE | Admit: 2015-12-18 | Discharge: 2015-12-18 | Disposition: A | Discharge status: Home / Self Care | Payer: PPO | Source: Ambulatory Visit | Attending: Nurse Practitioner | Admitting: Nurse Practitioner

## 2015-12-18 DIAGNOSIS — R1011 Right upper quadrant pain: Secondary | ICD-10-CM | POA: Diagnosis not present

## 2015-12-18 DIAGNOSIS — R1013 Epigastric pain: Secondary | ICD-10-CM | POA: Diagnosis not present

## 2015-12-18 DIAGNOSIS — K760 Fatty (change of) liver, not elsewhere classified: Secondary | ICD-10-CM | POA: Insufficient documentation

## 2015-12-23 DIAGNOSIS — R1011 Right upper quadrant pain: Secondary | ICD-10-CM | POA: Diagnosis not present

## 2016-01-29 DIAGNOSIS — I6522 Occlusion and stenosis of left carotid artery: Secondary | ICD-10-CM | POA: Diagnosis not present

## 2016-01-29 DIAGNOSIS — R0789 Other chest pain: Secondary | ICD-10-CM | POA: Diagnosis not present

## 2016-01-29 DIAGNOSIS — I25118 Atherosclerotic heart disease of native coronary artery with other forms of angina pectoris: Secondary | ICD-10-CM | POA: Diagnosis not present

## 2016-01-29 DIAGNOSIS — I2510 Atherosclerotic heart disease of native coronary artery without angina pectoris: Secondary | ICD-10-CM | POA: Diagnosis not present

## 2016-01-29 DIAGNOSIS — I10 Essential (primary) hypertension: Secondary | ICD-10-CM | POA: Diagnosis not present

## 2016-01-29 DIAGNOSIS — E782 Mixed hyperlipidemia: Secondary | ICD-10-CM | POA: Diagnosis not present

## 2016-02-04 DIAGNOSIS — E119 Type 2 diabetes mellitus without complications: Secondary | ICD-10-CM | POA: Diagnosis not present

## 2016-02-04 DIAGNOSIS — E782 Mixed hyperlipidemia: Secondary | ICD-10-CM | POA: Diagnosis not present

## 2016-02-04 DIAGNOSIS — I10 Essential (primary) hypertension: Secondary | ICD-10-CM | POA: Diagnosis not present

## 2016-02-06 DIAGNOSIS — R0789 Other chest pain: Secondary | ICD-10-CM | POA: Diagnosis not present

## 2016-02-08 DIAGNOSIS — E119 Type 2 diabetes mellitus without complications: Secondary | ICD-10-CM | POA: Diagnosis not present

## 2016-02-11 DIAGNOSIS — E782 Mixed hyperlipidemia: Secondary | ICD-10-CM | POA: Diagnosis not present

## 2016-02-11 DIAGNOSIS — E118 Type 2 diabetes mellitus with unspecified complications: Secondary | ICD-10-CM | POA: Diagnosis not present

## 2016-02-11 DIAGNOSIS — I10 Essential (primary) hypertension: Secondary | ICD-10-CM | POA: Diagnosis not present

## 2016-02-11 DIAGNOSIS — I2510 Atherosclerotic heart disease of native coronary artery without angina pectoris: Secondary | ICD-10-CM | POA: Diagnosis not present

## 2016-02-12 DIAGNOSIS — M7751 Other enthesopathy of right foot: Secondary | ICD-10-CM | POA: Diagnosis not present

## 2016-02-14 DIAGNOSIS — E782 Mixed hyperlipidemia: Secondary | ICD-10-CM | POA: Diagnosis not present

## 2016-02-14 DIAGNOSIS — I6529 Occlusion and stenosis of unspecified carotid artery: Secondary | ICD-10-CM | POA: Diagnosis not present

## 2016-02-14 DIAGNOSIS — I6522 Occlusion and stenosis of left carotid artery: Secondary | ICD-10-CM | POA: Diagnosis not present

## 2016-02-14 DIAGNOSIS — I2510 Atherosclerotic heart disease of native coronary artery without angina pectoris: Secondary | ICD-10-CM | POA: Diagnosis not present

## 2016-02-14 DIAGNOSIS — I10 Essential (primary) hypertension: Secondary | ICD-10-CM | POA: Diagnosis not present

## 2016-02-21 DIAGNOSIS — F329 Major depressive disorder, single episode, unspecified: Secondary | ICD-10-CM | POA: Diagnosis not present

## 2016-02-21 DIAGNOSIS — D696 Thrombocytopenia, unspecified: Secondary | ICD-10-CM | POA: Diagnosis not present

## 2016-02-21 DIAGNOSIS — Z79899 Other long term (current) drug therapy: Secondary | ICD-10-CM | POA: Diagnosis not present

## 2016-02-21 DIAGNOSIS — I252 Old myocardial infarction: Secondary | ICD-10-CM | POA: Diagnosis not present

## 2016-02-21 DIAGNOSIS — Z955 Presence of coronary angioplasty implant and graft: Secondary | ICD-10-CM | POA: Diagnosis not present

## 2016-02-21 DIAGNOSIS — I6529 Occlusion and stenosis of unspecified carotid artery: Secondary | ICD-10-CM | POA: Diagnosis not present

## 2016-02-21 DIAGNOSIS — I25118 Atherosclerotic heart disease of native coronary artery with other forms of angina pectoris: Secondary | ICD-10-CM | POA: Diagnosis not present

## 2016-02-21 DIAGNOSIS — Z888 Allergy status to other drugs, medicaments and biological substances status: Secondary | ICD-10-CM | POA: Diagnosis not present

## 2016-02-21 DIAGNOSIS — E785 Hyperlipidemia, unspecified: Secondary | ICD-10-CM | POA: Diagnosis not present

## 2016-02-21 DIAGNOSIS — I10 Essential (primary) hypertension: Secondary | ICD-10-CM | POA: Diagnosis not present

## 2016-02-21 DIAGNOSIS — R0602 Shortness of breath: Secondary | ICD-10-CM | POA: Diagnosis not present

## 2016-02-21 DIAGNOSIS — Z951 Presence of aortocoronary bypass graft: Secondary | ICD-10-CM | POA: Diagnosis not present

## 2016-02-21 DIAGNOSIS — E119 Type 2 diabetes mellitus without complications: Secondary | ICD-10-CM | POA: Diagnosis not present

## 2016-02-21 DIAGNOSIS — Z7984 Long term (current) use of oral hypoglycemic drugs: Secondary | ICD-10-CM | POA: Diagnosis not present

## 2016-02-21 DIAGNOSIS — R079 Chest pain, unspecified: Secondary | ICD-10-CM | POA: Diagnosis not present

## 2016-02-21 DIAGNOSIS — I447 Left bundle-branch block, unspecified: Secondary | ICD-10-CM | POA: Diagnosis not present

## 2016-02-22 DIAGNOSIS — R079 Chest pain, unspecified: Secondary | ICD-10-CM | POA: Diagnosis not present

## 2016-03-03 ENCOUNTER — Ambulatory Visit: Admission: RE | Admit: 2016-03-03 | Payer: PPO | Source: Ambulatory Visit | Admitting: Gastroenterology

## 2016-03-03 ENCOUNTER — Encounter: Admission: RE | Payer: Self-pay | Source: Ambulatory Visit

## 2016-03-03 SURGERY — ESOPHAGOGASTRODUODENOSCOPY (EGD) WITH PROPOFOL
Anesthesia: General

## 2016-03-10 DIAGNOSIS — I779 Disorder of arteries and arterioles, unspecified: Secondary | ICD-10-CM | POA: Diagnosis not present

## 2016-03-10 DIAGNOSIS — I2510 Atherosclerotic heart disease of native coronary artery without angina pectoris: Secondary | ICD-10-CM | POA: Diagnosis not present

## 2016-03-10 DIAGNOSIS — I10 Essential (primary) hypertension: Secondary | ICD-10-CM | POA: Diagnosis not present

## 2016-03-10 DIAGNOSIS — I6522 Occlusion and stenosis of left carotid artery: Secondary | ICD-10-CM | POA: Diagnosis not present

## 2016-03-10 DIAGNOSIS — Z9861 Coronary angioplasty status: Secondary | ICD-10-CM | POA: Diagnosis not present

## 2016-03-10 DIAGNOSIS — E782 Mixed hyperlipidemia: Secondary | ICD-10-CM | POA: Diagnosis not present

## 2016-03-10 DIAGNOSIS — I6529 Occlusion and stenosis of unspecified carotid artery: Secondary | ICD-10-CM | POA: Diagnosis not present

## 2016-05-09 DIAGNOSIS — E119 Type 2 diabetes mellitus without complications: Secondary | ICD-10-CM | POA: Diagnosis not present

## 2016-06-25 DIAGNOSIS — E119 Type 2 diabetes mellitus without complications: Secondary | ICD-10-CM | POA: Diagnosis not present

## 2016-08-08 DIAGNOSIS — E119 Type 2 diabetes mellitus without complications: Secondary | ICD-10-CM | POA: Diagnosis not present

## 2016-08-28 DIAGNOSIS — E119 Type 2 diabetes mellitus without complications: Secondary | ICD-10-CM | POA: Diagnosis not present

## 2016-08-28 DIAGNOSIS — E782 Mixed hyperlipidemia: Secondary | ICD-10-CM | POA: Diagnosis not present

## 2016-09-04 DIAGNOSIS — E118 Type 2 diabetes mellitus with unspecified complications: Secondary | ICD-10-CM | POA: Diagnosis not present

## 2016-09-04 DIAGNOSIS — Z0000 Encounter for general adult medical examination without abnormal findings: Secondary | ICD-10-CM | POA: Insufficient documentation

## 2016-09-04 DIAGNOSIS — E782 Mixed hyperlipidemia: Secondary | ICD-10-CM | POA: Diagnosis not present

## 2016-09-04 DIAGNOSIS — I10 Essential (primary) hypertension: Secondary | ICD-10-CM | POA: Diagnosis not present

## 2016-09-04 DIAGNOSIS — Z7185 Encounter for immunization safety counseling: Secondary | ICD-10-CM | POA: Insufficient documentation

## 2016-09-14 DIAGNOSIS — I10 Essential (primary) hypertension: Secondary | ICD-10-CM | POA: Diagnosis not present

## 2016-09-14 DIAGNOSIS — I2510 Atherosclerotic heart disease of native coronary artery without angina pectoris: Secondary | ICD-10-CM | POA: Diagnosis not present

## 2016-09-14 DIAGNOSIS — R0789 Other chest pain: Secondary | ICD-10-CM | POA: Diagnosis not present

## 2016-09-14 DIAGNOSIS — I6522 Occlusion and stenosis of left carotid artery: Secondary | ICD-10-CM | POA: Diagnosis not present

## 2016-09-14 DIAGNOSIS — I25118 Atherosclerotic heart disease of native coronary artery with other forms of angina pectoris: Secondary | ICD-10-CM | POA: Diagnosis not present

## 2016-09-14 DIAGNOSIS — E782 Mixed hyperlipidemia: Secondary | ICD-10-CM | POA: Diagnosis not present

## 2016-09-14 DIAGNOSIS — R0989 Other specified symptoms and signs involving the circulatory and respiratory systems: Secondary | ICD-10-CM | POA: Diagnosis not present

## 2016-10-05 ENCOUNTER — Encounter (INDEPENDENT_AMBULATORY_CARE_PROVIDER_SITE_OTHER): Payer: PPO | Admitting: Vascular Surgery

## 2016-10-12 ENCOUNTER — Encounter (INDEPENDENT_AMBULATORY_CARE_PROVIDER_SITE_OTHER): Payer: Self-pay | Admitting: Vascular Surgery

## 2016-10-12 ENCOUNTER — Ambulatory Visit (INDEPENDENT_AMBULATORY_CARE_PROVIDER_SITE_OTHER): Payer: PPO | Admitting: Vascular Surgery

## 2016-10-12 ENCOUNTER — Encounter (INDEPENDENT_AMBULATORY_CARE_PROVIDER_SITE_OTHER): Payer: Self-pay

## 2016-10-12 DIAGNOSIS — I6529 Occlusion and stenosis of unspecified carotid artery: Secondary | ICD-10-CM | POA: Insufficient documentation

## 2016-10-12 DIAGNOSIS — I2510 Atherosclerotic heart disease of native coronary artery without angina pectoris: Secondary | ICD-10-CM | POA: Insufficient documentation

## 2016-10-12 DIAGNOSIS — E1165 Type 2 diabetes mellitus with hyperglycemia: Secondary | ICD-10-CM | POA: Insufficient documentation

## 2016-10-12 DIAGNOSIS — I6523 Occlusion and stenosis of bilateral carotid arteries: Secondary | ICD-10-CM | POA: Diagnosis not present

## 2016-10-12 DIAGNOSIS — E782 Mixed hyperlipidemia: Secondary | ICD-10-CM

## 2016-10-12 DIAGNOSIS — E1159 Type 2 diabetes mellitus with other circulatory complications: Secondary | ICD-10-CM | POA: Diagnosis not present

## 2016-10-12 DIAGNOSIS — E119 Type 2 diabetes mellitus without complications: Secondary | ICD-10-CM | POA: Insufficient documentation

## 2016-10-12 DIAGNOSIS — I10 Essential (primary) hypertension: Secondary | ICD-10-CM

## 2016-10-12 DIAGNOSIS — I25118 Atherosclerotic heart disease of native coronary artery with other forms of angina pectoris: Secondary | ICD-10-CM | POA: Diagnosis not present

## 2016-10-12 DIAGNOSIS — E785 Hyperlipidemia, unspecified: Secondary | ICD-10-CM | POA: Insufficient documentation

## 2016-10-12 NOTE — Progress Notes (Signed)
MRN : [id number]  Susan Allen is a 76 y.o. (November 27, 1940) female who presents with chief complaint of No chief complaint on file. Marland Kitchen  History of Present Illness: The patient is seen for evaluation of carotid stenosis. The carotid stenosis was identified remotely and she has been followed at [facility] in the past.  Her lat carotid duplex was in 2014 and showed <50% RICA and 37-16% LICA.  Recently,  Dr Ubaldo Glassing auscultated a bruit.  The patient denies amaurosis fugax. There is no recent history of TIA symptoms or focal motor deficits. There is no prior documented CVA.  There is no history of migraine headaches. There is no history of seizures.  The patient is taking enteric-coated aspirin 81 mg daily.  The patient has a history of coronary artery disease,  She describes a recent episode of angina and is going to see Dr Ubaldo Glassing today.  No shortness of breath. The patient denies PAD or claudication symptoms. There is a history of hyperlipidemia which is being treated with a statin.   No outpatient prescriptions have been marked as taking for the 10/12/16 encounter (Appointment) with Delana Meyer, Dolores Lory, MD.    Past Medical History:  Diagnosis Date  . Anxiety   . Arthritis   . Bronchitis   . Coronary artery disease   . Diabetes mellitus without complication ([place])   . GERD (gastroesophageal reflux disease)   . Headache   . Hypertension   . Myocardial infarction June 2014, July 2014    Past Surgical History:  Procedure Laterality Date  . ABDOMINAL HYSTERECTOMY    . CORONARY ANGIOPLASTY     with stents   . DIGIT NAIL REMOVAL Bilateral 08/30/2015   Procedure: REMOVAL OF DIGIT NAILS/ BIL. PERM.REMOVAL INGROWN GREAT TOE NAILS;  Surgeon: Albertine Patricia, DPM;  Location: [facility] ORS;  Service: Podiatry;  Laterality: Bilateral;  . 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  JOINT REPLACEMENT Bilateral    Total Knee Replacement, Dr Burnis Kingfisher, [place]    Social History Social History  Substance Use Topics  . Smoking status: Former Smoker    Packs/day: 0.25    Types: Cigarettes    Quit date: 02/23/2004  . Smokeless tobacco: Never Used  . Alcohol use No    Family History Family History  Problem Relation Age of Onset  . Colon cancer Mother 67  . Breast cancer Neg Hx   No family history of bleeding/clotting disorders, porphyria or autoimmune disease   Allergies  Allergen Reactions  . Aspirin Hives and Other (See Comments)    "fainting"  . Lisinopril Cough     REVIEW OF SYSTEMS (Negative unless checked)  Constitutional: [] Weight loss  [] Fever  [] Chills Cardiac: [x] Chest pain   [] Chest pressure   [] Palpitations   [] Shortness of breath when laying flat   [x] Shortness of breath with exertion. Vascular:  [] Pain in legs with walking   [] Pain in legs at rest  [] History of DVT   [] Phlebitis   [] Swelling in legs   [] Varicose veins   [] Non-healing ulcers Pulmonary:   [] Uses home oxygen   [] Productive cough   [] Hemoptysis   [] Wheeze  [] COPD   [] Asthma Neurologic:  [] Dizziness   [] Seizures   [] History of stroke   [] History of TIA  [] Aphasia   [] Vissual changes   [] Weakness or numbness in arm   [] Weakness or  numbness in leg Musculoskeletal:   [] Joint swelling   [] Joint pain   [] Low back pain Hematologic:  [] Easy bruising  [] Easy bleeding   [] Hypercoagulable state   [] Anemic Gastrointestinal:  [] Diarrhea   [] Vomiting  [] Gastroesophageal reflux/heartburn   [] Difficulty swallowing. Genitourinary:  [] Chronic kidney disease   [] Difficult urination  [] Frequent urination   [] Blood in urine Skin:  [] Rashes   [] Ulcers  Psychological:  [] History of anxiety   []  History of major depression.  Physical Examination  There were no vitals filed for this visit. There is no height or weight on file to calculate BMI. Gen: WD/WN, NAD Head: [place]/AT, No  temporalis wasting.  Ear/Nose/Throat: Hearing grossly intact, nares w/o erythema or drainage, poor dentition Eyes: PER, EOMI, sclera nonicteric.  Neck: Supple, no masses.  No bruit or JVD.  Pulmonary:  Good air movement, clear to auscultation bilaterally, no use of accessory muscles.  Cardiac: RRR, normal S1, S2, no Murmurs. Vascular: bilateral carotid bruits Vessel Right Left  Radial Palpable Palpable  Ulnar Palpable Palpable  Brachial Palpable Palpable  Carotid Palpable Palpable  Gastrointestinal: soft, non-distended. No guarding/no peritoneal signs.  Musculoskeletal: M/S 5/5 throughout.  No deformity or atrophy.  Neurologic: CN 2-12 intact. Pain and light touch intact in extremities.  Symmetrical.  Speech is fluent. Motor exam as listed above. Psychiatric: Judgment intact, Mood & affect appropriate for pt's clinical situation. Dermatologic: No rashes or ulcers noted.  No changes consistent with cellulitis. Lymph : No Cervical lymphadenopathy, no lichenification or skin changes of chronic lymphedema.  CBC Lab Results  Component Value Date   WBC 7.6 02/19/2013   HGB 12.1 08/26/2015   HCT 36.0 02/19/2013   MCV 84 02/19/2013   PLT 193 02/19/2013    BMET    Component Value Date/Time   NA 135 08/26/2015 1210   NA 138 02/19/2013 0511   K 3.7 08/26/2015 1210   K 4.1 02/19/2013 0511   CL 102 08/26/2015 1210   CL 103 02/19/2013 0511   CO2 27 08/26/2015 1210   CO2 32 02/19/2013 0511   GLUCOSE 119 (H) 08/26/2015 1210   GLUCOSE 142 (H) 02/19/2013 0511   BUN 19 08/26/2015 1210   BUN 15 02/19/2013 0511   CREATININE 0.76 08/26/2015 1210   CREATININE 0.86 02/19/2013 0511   CALCIUM 9.2 08/26/2015 1210   CALCIUM 8.9 02/19/2013 0511   GFRNONAA >60 08/26/2015 1210   GFRNONAA >60 02/19/2013 0511   GFRAA >60 08/26/2015 1210   GFRAA >60 02/19/2013 0511   CrCl cannot be calculated (Patient's most recent lab result is older than the maximum 21 days allowed.).  COAG Lab Results    Component Value Date   INR 0.8 11/26/2012    Radiology No results found.  Assessment/Plan 1. Bilateral carotid artery stenosis Recommend:  Given the patient's asymptomatic subcritical stenosis no further invasive testing or surgery at this time.  Duplex ultrasound is over due and will be ordered  Continue antiplatelet therapy as prescribed Continue management of CAD, HTN and Hyperlipidemia Healthy heart diet,  encouraged exercise at least 4 times per week Follow up with duplex ultrasound and physical exam at her convenience  2. Essential hypertension Continue antihypertensive medications as already ordered, these medications have been reviewed and there are no changes at this time.   3. 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  Hgb A1C to be monitored as already arranged by primary service   5.  CAD Continue cardiac and antihypertensive medications as already ordered and reviewed, no changes at this time.  Continue statin as ordered and reviewed, no changes at this time  Nitrates PRN for chest pain   Hortencia Pilar, MD  10/12/2016 8:34 AM

## 2016-10-20 ENCOUNTER — Encounter (INDEPENDENT_AMBULATORY_CARE_PROVIDER_SITE_OTHER): Payer: PPO

## 2016-10-20 ENCOUNTER — Ambulatory Visit (INDEPENDENT_AMBULATORY_CARE_PROVIDER_SITE_OTHER): Payer: PPO | Admitting: Vascular Surgery

## 2016-12-03 ENCOUNTER — Other Ambulatory Visit: Payer: Self-pay | Admitting: Family Medicine

## 2016-12-03 DIAGNOSIS — Z1231 Encounter for screening mammogram for malignant neoplasm of breast: Secondary | ICD-10-CM

## 2016-12-17 ENCOUNTER — Ambulatory Visit (INDEPENDENT_AMBULATORY_CARE_PROVIDER_SITE_OTHER): Payer: PPO | Admitting: Vascular Surgery

## 2016-12-17 ENCOUNTER — Encounter (INDEPENDENT_AMBULATORY_CARE_PROVIDER_SITE_OTHER): Payer: PPO

## 2016-12-23 ENCOUNTER — Ambulatory Visit
Admission: RE | Admit: 2016-12-23 | Discharge: 2016-12-23 | Disposition: A | Discharge status: Home / Self Care | Payer: PPO | Source: Ambulatory Visit | Attending: Family Medicine | Admitting: Family Medicine

## 2016-12-23 DIAGNOSIS — Z1231 Encounter for screening mammogram for malignant neoplasm of breast: Secondary | ICD-10-CM | POA: Diagnosis not present

## 2017-02-04 ENCOUNTER — Ambulatory Visit (INDEPENDENT_AMBULATORY_CARE_PROVIDER_SITE_OTHER): Payer: PPO

## 2017-02-04 ENCOUNTER — Encounter (INDEPENDENT_AMBULATORY_CARE_PROVIDER_SITE_OTHER): Payer: Self-pay | Admitting: Vascular Surgery

## 2017-02-04 ENCOUNTER — Ambulatory Visit (INDEPENDENT_AMBULATORY_CARE_PROVIDER_SITE_OTHER): Payer: PPO | Admitting: Vascular Surgery

## 2017-02-04 VITALS — BP 135/71 | HR 63 | Resp 17 | Wt 195.0 lb

## 2017-02-04 DIAGNOSIS — E782 Mixed hyperlipidemia: Secondary | ICD-10-CM | POA: Diagnosis not present

## 2017-02-04 DIAGNOSIS — I6523 Occlusion and stenosis of bilateral carotid arteries: Secondary | ICD-10-CM

## 2017-02-04 DIAGNOSIS — E1159 Type 2 diabetes mellitus with other circulatory complications: Secondary | ICD-10-CM | POA: Diagnosis not present

## 2017-02-04 DIAGNOSIS — I10 Essential (primary) hypertension: Secondary | ICD-10-CM | POA: Diagnosis not present

## 2017-02-04 DIAGNOSIS — I25118 Atherosclerotic heart disease of native coronary artery with other forms of angina pectoris: Secondary | ICD-10-CM | POA: Diagnosis not present

## 2017-02-04 NOTE — Progress Notes (Signed)
MRN : [id number]  Susan Allen is a 76 y.o. ([birth year]) female who presents with chief complaint of  Chief Complaint  Patient presents with  . Carotid    pt conv  .  History of Present Illness: The patient is seen for follow up evaluation of carotid stenosis. The carotid stenosis followed by ultrasound.   The patient denies amaurosis fugax. There is no recent history of TIA symptoms or focal motor deficits. There is no prior documented CVA.  The patient is taking enteric-coated aspirin 81 mg daily.  There is no history of migraine headaches. There is no history of seizures.  The patient has a history of coronary artery disease, no recent episodes of angina or shortness of breath. The patient denies PAD or claudication symptoms. There is a history of hyperlipidemia which is being treated with a statin.    Carotid Duplex done today shows <40% RICA and 95-62% LICA.  Increase in the left side compared to last study in 05/02/2013  Current Meds  Medication Sig  . albuterol (PROVENTIL HFA;VENTOLIN HFA) 108 (90 Base) MCG/ACT inhaler Inhale 2 puffs into the lungs every 4 (four) hours as needed for wheezing or shortness of breath. Reported on 08/30/2015  . alum & mag hydroxide-simeth (MYLANTA) 130-865-78 MG/5ML suspension Take 15 mLs by mouth every 6 (six) hours as needed for indigestion or heartburn.  Marland Kitchen amoxicillin (AMOXIL) 500 MG tablet Take 500 mg by mouth. Reported on 08/30/2015  . cetirizine (ZYRTEC) 10 MG tablet Take 10 mg by mouth as needed for allergies.  . diphenhydrAMINE (SOMINEX) 25 MG tablet Take 25 mg by mouth at bedtime as needed for sleep.  . fenofibrate (TRICOR) 145 MG tablet Take 145 mg by mouth at bedtime.  Marland Kitchen guaiFENesin (MUCINEX) 600 MG 12 hr tablet Take by mouth as needed.  . isosorbide mononitrate (IMDUR) 30 MG 24 hr tablet Take 30 mg by mouth daily.  Marland Kitchen losartan (COZAAR) 25 MG tablet Take 25 mg by mouth daily.  . metFORMIN (GLUCOPHAGE) 500 MG tablet Take 500 mg by mouth 2  (two) times daily with a meal.  . metoprolol tartrate (LOPRESSOR) 25 MG tablet Take 37.5 mg by mouth 2 (two) times daily.  . Multiple Vitamins-Minerals (CENTRUM) tablet Take 1 tablet by mouth daily.  . nitroGLYCERIN (NITROSTAT) 0.4 MG SL tablet Place 0.4 mg under the tongue every 5 (five) minutes as needed for chest pain.  Marland Kitchen PARoxetine (PAXIL) 20 MG tablet Take 20 mg by mouth at bedtime.  . pravastatin (PRAVACHOL) 40 MG tablet Take 40 mg by mouth at bedtime.  . ranitidine (ZANTAC) 150 MG tablet Take 150 mg by mouth every morning.  . ticagrelor (BRILINTA) 90 MG TABS tablet Take 90 mg by mouth 2 (two) times daily.  . traZODone (DESYREL) 50 MG tablet Take 50 mg by mouth at bedtime.    Past Medical History:  Diagnosis Date  . Anxiety   . Arthritis   . Bronchitis   . Coronary artery disease   . Diabetes mellitus without complication ([place])   . GERD (gastroesophageal reflux disease)   . Headache   . Hypertension   . Myocardial infarction [facility]) June 2014, July 2014    Past Surgical History:  Procedure Laterality Date  . ABDOMINAL HYSTERECTOMY    . CORONARY ANGIOPLASTY     with stents   . DIGIT NAIL REMOVAL Bilateral 08/30/2015   Procedure: REMOVAL OF DIGIT NAILS/ BIL. PERM.REMOVAL INGROWN GREAT TOE NAILS;  Surgeon: Albertine Patricia, DPM;  Location:  [facility] ORS;  Service: Podiatry;  Laterality: Bilateral;  . 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 JOINT REPLACEMENT Bilateral    Total Knee Replacement, Dr Burnis Kingfisher, [place]    Social History Social History  Substance Use Topics  . Smoking status: Former Smoker    Packs/day: 0.25    Types: Cigarettes    Quit date: 02/23/2004  . Smokeless tobacco: Never Used  . Alcohol use No    Family History Family History  Problem Relation Age of Onset  . Colon cancer Mother 32  . Breast  cancer Neg Hx     Allergies  Allergen Reactions  . Aspirin Hives and Other (See Comments)    "fainting"  . Lisinopril Cough     REVIEW OF SYSTEMS (Negative unless checked)  Constitutional: [] Weight loss  [] Fever  [] Chills Cardiac: [] Chest pain   [] Chest pressure   [] Palpitations   [] Shortness of breath when laying flat   [] Shortness of breath with exertion. Vascular:  [] Pain in legs with walking   [] Pain in legs at rest  [] History of DVT   [] Phlebitis   [] Swelling in legs   [] Varicose veins   [] Non-healing ulcers Pulmonary:   [] Uses home oxygen   [] Productive cough   [] Hemoptysis   [] Wheeze  [] COPD   [] Asthma Neurologic:  [] Dizziness   [] Seizures   [] History of stroke   [] History of TIA  [] Aphasia   [] Vissual changes   [] Weakness or numbness in arm   [] Weakness or numbness in leg Musculoskeletal:   [] Joint swelling   [] Joint pain   [] Low back pain Hematologic:  [] Easy bruising  [] Easy bleeding   [] Hypercoagulable state   [] Anemic Gastrointestinal:  [] Diarrhea   [] Vomiting  [] Gastroesophageal reflux/heartburn   [] Difficulty swallowing. Genitourinary:  [] Chronic kidney disease   [] Difficult urination  [] Frequent urination   [] Blood in urine Skin:  [] Rashes   [] Ulcers  Psychological:  [] History of anxiety   []  History of major depression.  Physical Examination  Vitals:   02/04/17 1112 02/04/17 1113  BP: (!) 148/77 135/71  Pulse: 63   Resp: 17   Weight: 195 lb (88.5 kg)    Body mass index is 35.67 kg/m. Gen: WD/WN, NAD Head: [place]/AT, No temporalis wasting.  Ear/Nose/Throat: Hearing grossly intact, nares w/o erythema or drainage Eyes: PER, EOMI, sclera nonicteric.  Neck: Supple, no large masses.   Pulmonary:  Good air movement, no audible wheezing bilaterally, no use of accessory muscles.  Cardiac: RRR, no JVD Vascular:  Left carotid bruit Vessel Right Left  Radial Palpable Palpable  Carotid Palpable Palpable  PT Palpable Palpable  DP Palpable Palpable  Gastrointestinal:  Non-distended. No guarding/no peritoneal signs.  Musculoskeletal: M/S 5/5 throughout.  No deformity or atrophy.  Neurologic: CN 2-12 intact. Symmetrical.  Speech is fluent. Motor exam as listed above. Psychiatric: Judgment intact, Mood & affect appropriate for pt's clinical situation. Dermatologic: No rashes or ulcers noted.  No changes consistent with cellulitis. Lymph : No lichenification or skin changes of chronic lymphedema.  CBC Lab Results  Component Value Date   WBC 7.6 02/19/2013   HGB 12.1 08/26/2015   HCT 36.0 02/19/2013   MCV 84 02/19/2013   PLT 193 02/19/2013    BMET    Component Value Date/Time   NA 135 08/26/2015 1210   NA 138 02/19/2013 0511   K 3.7 08/26/2015 1210  K 4.1 02/19/2013 0511   CL 102 08/26/2015 1210   CL 103 02/19/2013 0511   CO2 27 08/26/2015 1210   CO2 32 02/19/2013 0511   GLUCOSE 119 (H) 08/26/2015 1210   GLUCOSE 142 (H) 02/19/2013 0511   BUN 19 08/26/2015 1210   BUN 15 02/19/2013 0511   CREATININE 0.76 08/26/2015 1210   CREATININE 0.86 02/19/2013 0511   CALCIUM 9.2 08/26/2015 1210   CALCIUM 8.9 02/19/2013 0511   GFRNONAA >60 08/26/2015 1210   GFRNONAA >60 02/19/2013 0511   GFRAA >60 08/26/2015 1210   GFRAA >60 02/19/2013 0511   CrCl cannot be calculated (Patient's most recent lab result is older than the maximum 21 days allowed.).  COAG Lab Results  Component Value Date   INR 0.8 11/26/2012    Radiology No results found.    Assessment/Plan 1. Bilateral carotid artery stenosis Recommend:  Given the patient's asymptomatic subcritical stenosis no further invasive testing or surgery at this time.  Duplex ultrasound shows <80% stenosis bilaterally.  Continue antiplatelet therapy as prescribed Continue management of CAD, HTN and Hyperlipidemia Healthy heart diet,  encouraged exercise at least 4 times per week Follow up in 6 months with duplex ultrasound and physical exam based on >50% stenosis of the LICAarotid artery   -  VAS US CAROTID; Future  2. Coronary artery disease of native artery of native heart with stable angina pectoris (HCC) Continue cardiac and antihypertensive medications as already ordered and reviewed, no changes at this time.  Continue statin as ordered and reviewed, no changes at this time  Nitrates PRN for chest pain   3. Essential hypertension Continue antihypertensive medications as already ordered, these medications have been reviewed and there are no changes at this time.   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  Hgb A1C to be monitored as already arranged by primary service   5. Mixed hyperlipidemia Continue statin as ordered and reviewed, no changes at this time     Hortencia Pilar, MD  02/04/2017 12:31 PM

## 2017-03-19 DIAGNOSIS — I2510 Atherosclerotic heart disease of native coronary artery without angina pectoris: Secondary | ICD-10-CM | POA: Diagnosis not present

## 2017-03-19 DIAGNOSIS — I25118 Atherosclerotic heart disease of native coronary artery with other forms of angina pectoris: Secondary | ICD-10-CM | POA: Diagnosis not present

## 2017-04-22 DIAGNOSIS — K2270 Barrett's esophagus without dysplasia: Secondary | ICD-10-CM | POA: Diagnosis not present

## 2017-04-22 DIAGNOSIS — E118 Type 2 diabetes mellitus with unspecified complications: Secondary | ICD-10-CM | POA: Diagnosis not present

## 2017-04-22 DIAGNOSIS — I6522 Occlusion and stenosis of left carotid artery: Secondary | ICD-10-CM | POA: Diagnosis not present

## 2017-04-22 DIAGNOSIS — I2510 Atherosclerotic heart disease of native coronary artery without angina pectoris: Secondary | ICD-10-CM | POA: Diagnosis not present

## 2017-04-22 DIAGNOSIS — Z7901 Long term (current) use of anticoagulants: Secondary | ICD-10-CM | POA: Diagnosis not present

## 2017-06-15 DIAGNOSIS — F331 Major depressive disorder, recurrent, moderate: Secondary | ICD-10-CM | POA: Diagnosis not present

## 2017-06-15 DIAGNOSIS — E118 Type 2 diabetes mellitus with unspecified complications: Secondary | ICD-10-CM | POA: Diagnosis not present

## 2017-06-15 DIAGNOSIS — I10 Essential (primary) hypertension: Secondary | ICD-10-CM | POA: Diagnosis not present

## 2017-06-15 DIAGNOSIS — E782 Mixed hyperlipidemia: Secondary | ICD-10-CM | POA: Diagnosis not present

## 2017-06-16 DIAGNOSIS — E118 Type 2 diabetes mellitus with unspecified complications: Secondary | ICD-10-CM | POA: Diagnosis not present

## 2017-06-16 DIAGNOSIS — I10 Essential (primary) hypertension: Secondary | ICD-10-CM | POA: Diagnosis not present

## 2017-06-16 DIAGNOSIS — E782 Mixed hyperlipidemia: Secondary | ICD-10-CM | POA: Diagnosis not present

## 2017-06-23 ENCOUNTER — Ambulatory Visit: Admit: 2017-06-23 | Payer: PPO | Admitting: Internal Medicine

## 2017-06-23 SURGERY — ESOPHAGOGASTRODUODENOSCOPY (EGD) WITH PROPOFOL
Anesthesia: General

## 2017-06-25 LAB — HM DIABETES EYE EXAM

## 2017-09-03 ENCOUNTER — Other Ambulatory Visit: Payer: Self-pay

## 2017-09-03 ENCOUNTER — Emergency Department
Admission: EM | Admit: 2017-09-03 | Discharge: 2017-09-03 | Disposition: A | Discharge status: Home / Self Care | Payer: PPO | Attending: Emergency Medicine | Admitting: Emergency Medicine

## 2017-09-03 ENCOUNTER — Emergency Department: Payer: PPO

## 2017-09-03 DIAGNOSIS — Y9389 Activity, other specified: Secondary | ICD-10-CM | POA: Diagnosis not present

## 2017-09-03 DIAGNOSIS — Z955 Presence of coronary angioplasty implant and graft: Secondary | ICD-10-CM | POA: Diagnosis not present

## 2017-09-03 DIAGNOSIS — Y999 Unspecified external cause status: Secondary | ICD-10-CM | POA: Diagnosis not present

## 2017-09-03 DIAGNOSIS — I10 Essential (primary) hypertension: Secondary | ICD-10-CM | POA: Insufficient documentation

## 2017-09-03 DIAGNOSIS — I252 Old myocardial infarction: Secondary | ICD-10-CM | POA: Diagnosis not present

## 2017-09-03 DIAGNOSIS — Y929 Unspecified place or not applicable: Secondary | ICD-10-CM | POA: Diagnosis not present

## 2017-09-03 DIAGNOSIS — Z79899 Other long term (current) drug therapy: Secondary | ICD-10-CM | POA: Insufficient documentation

## 2017-09-03 DIAGNOSIS — I2510 Atherosclerotic heart disease of native coronary artery without angina pectoris: Secondary | ICD-10-CM | POA: Diagnosis not present

## 2017-09-03 DIAGNOSIS — E119 Type 2 diabetes mellitus without complications: Secondary | ICD-10-CM | POA: Insufficient documentation

## 2017-09-03 DIAGNOSIS — Z87891 Personal history of nicotine dependence: Secondary | ICD-10-CM | POA: Insufficient documentation

## 2017-09-03 DIAGNOSIS — T189XXA Foreign body of alimentary tract, part unspecified, initial encounter: Secondary | ICD-10-CM | POA: Diagnosis not present

## 2017-09-03 DIAGNOSIS — X58XXXA Exposure to other specified factors, initial encounter: Secondary | ICD-10-CM | POA: Insufficient documentation

## 2017-09-03 DIAGNOSIS — Z96653 Presence of artificial knee joint, bilateral: Secondary | ICD-10-CM | POA: Insufficient documentation

## 2017-09-03 DIAGNOSIS — T184XXA Foreign body in colon, initial encounter: Secondary | ICD-10-CM | POA: Diagnosis not present

## 2017-09-03 DIAGNOSIS — T182XXA Foreign body in stomach, initial encounter: Secondary | ICD-10-CM

## 2017-09-03 NOTE — ED Triage Notes (Signed)
Pt states she accidentally swallowed her earrings this morning. States she took them out last night and inadvertently dropped them in her medicine cup.. Denies any pain.Marland Kitchen

## 2017-09-03 NOTE — ED Provider Notes (Signed)
[facility] Emergency Department Provider Note   ____________________________________________    I have reviewed the triage vital signs and the nursing notes.   HISTORY  Chief Complaint Foreign Body     HPI Susan Allen is a 77 y.o. female who presents with complaints of accidental foreign body ingestion.  Patient reports that she had put her earrings accidentally into a medicine cup last night and this morning took her medicine and accidentally swallowed her earrings.  This happened early this morning and when she went to an exercise class they told her she should come to the emergency department.  She denies abdominal pain.  No nausea or vomiting.  She has a colonoscopy scheduled for early May   Past Medical History:  Diagnosis Date  . Anxiety   . Arthritis   . Bronchitis   . Coronary artery disease   . Diabetes mellitus without complication ([place])   . GERD (gastroesophageal reflux disease)   . Headache   . Hypertension   . Myocardial infarction [facility]) June 2014, July 2014    Patient Active Problem List   Diagnosis Date Noted  . Carotid stenosis 10/12/2016  . Essential hypertension 10/12/2016  . Hyperlipidemia 10/12/2016  . Diabetes ([place]) 10/12/2016  . CAD (coronary artery disease) 10/12/2016    Past Surgical History:  Procedure Laterality Date  . ABDOMINAL HYSTERECTOMY    . CORONARY ANGIOPLASTY     with stents   . DIGIT NAIL REMOVAL Bilateral 08/30/2015   Procedure: REMOVAL OF DIGIT NAILS/ BIL. PERM.REMOVAL INGROWN GREAT TOE NAILS;  Surgeon: Albertine Patricia, DPM;  Location: [facility] ORS;  Service: Podiatry;  Laterality: Bilateral;  . 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 JOINT REPLACEMENT Bilateral    Total Knee Replacement, Dr Burnis Kingfisher, [place]      Prior to  Admission medications   Medication Sig Start Date End Date Taking? Authorizing Provider  albuterol (PROVENTIL HFA;VENTOLIN HFA) 108 (90 Base) MCG/ACT inhaler Inhale 2 puffs into the lungs every 4 (four) hours as needed for wheezing or shortness of breath. Reported on 08/30/2015    [provider]  alum & mag hydroxide-simeth (MYLANTA) 200-200-20 MG/5ML suspension Take 15 mLs by mouth every 6 (six) hours as needed for indigestion or heartburn.    [provider]  amoxicillin (AMOXIL) 500 MG tablet Take 500 mg by mouth. Reported on 08/30/2015    [provider]  cetirizine (ZYRTEC) 10 MG tablet Take 10 mg by mouth as needed for allergies.    [provider]  diphenhydrAMINE (SOMINEX) 25 MG tablet Take 25 mg by mouth at bedtime as needed for sleep.    [provider]  fenofibrate (TRICOR) 145 MG tablet Take 145 mg by mouth at bedtime.    [provider]  guaiFENesin (MUCINEX) 600 MG 12 hr tablet Take by mouth as needed.    [provider]  isosorbide mononitrate (IMDUR) 30 MG 24 hr tablet Take 30 mg by mouth daily.    [provider]  losartan (COZAAR) 25 MG tablet Take 25 mg by mouth daily.    [provider]  metFORMIN (GLUCOPHAGE) 500 MG tablet Take 500 mg by mouth 2 (two) times daily with a meal.    [provider]  metoprolol tartrate (LOPRESSOR) 25 MG tablet Take 37.5 mg by mouth 2 (  two) times daily.    [provider]  Multiple Vitamins-Minerals (CENTRUM) tablet Take 1 tablet by mouth daily.    [provider]  nitroGLYCERIN (NITROSTAT) 0.4 MG SL tablet Place 0.4 mg under the tongue every 5 (five) minutes as needed for chest pain.    [provider]  oxyCODONE-acetaminophen (PERCOCET) 7.5-325 MG tablet Take 1 tablet by mouth every 4 (four) hours as needed for severe pain. Patient not taking: Reported on 10/12/2016 08/30/15   Albertine Patricia, DPM  PARoxetine (PAXIL) 20 MG tablet Take 20  mg by mouth at bedtime.    [provider]  pravastatin (PRAVACHOL) 40 MG tablet Take 40 mg by mouth at bedtime.    [provider]  ranitidine (ZANTAC) 150 MG tablet Take 150 mg by mouth every morning.    [provider]  ticagrelor (BRILINTA) 90 MG TABS tablet Take 90 mg by mouth 2 (two) times daily.    [provider]  traZODone (DESYREL) 50 MG tablet Take 50 mg by mouth at bedtime.    [provider]     Allergies Aspirin and Lisinopril  Family History  Problem Relation Age of Onset  . Colon cancer Mother 57  . Breast cancer Neg Hx     Social History Social History   Tobacco Use  . Smoking status: Former Smoker    Packs/day: 0.25    Types: Cigarettes    Last attempt to quit: 02/23/2004    Years since quitting: 13.5  . Smokeless tobacco: Never Used  Substance Use Topics  . Alcohol use: No  . Drug use: No    Review of Systems    ENT: No pain in the throat Cardiovascular: Denies chest pain. Respiratory: No cough or difficulty breathing Gastrointestinal: No abdominal pain.  No nausea, no vomiting.   Genitourinary: Negative for dysuria. Musculoskeletal: Negative for back pain.     ____________________________________________   PHYSICAL EXAM:  VITAL SIGNS: ED Triage Vitals [09/03/17 1405]  Enc Vitals Group     BP (!) 154/51     Pulse Rate 66     Resp 17     Temp 98 F (36.7 C)     Temp Source Oral     SpO2 95 %     Weight 83.5 kg (184 lb)     Height 1.575 m (5\' 2" )     Head Circumference      Peak Flow      Pain Score 0     Pain Loc      Pain Edu?      Excl. in [place]?     Constitutional: Alert and oriented. No acute distress. Pleasant and interactive Eyes: Conjunctivae are normal.   Nose: No congestion/rhinnorhea. Mouth/Throat: Mucous membranes are moist.  Pharynx normal  Cardiovascular: Normal rate, regular rhythm.   Good peripheral circulation. Respiratory: Normal respiratory effort.  No retractions.  . Gastrointestinal: Soft and nontender. No distention.  Genitourinary: deferred Musculoskeletal:  Warm and well perfused Neurologic:  Normal speech and language. No gross focal neurologic deficits are appreciated.  Skin:  Skin is warm, dry and intact. No rash noted. Psychiatric: Mood and affect are normal. Speech and behavior are normal.  ____________________________________________   LABS (all labs ordered are listed, but only abnormal results are displayed)  Labs Reviewed - No data to display ____________________________________________  EKG  None ____________________________________________  RADIOLOGY  X-ray chest unremarkable X-ray abdomen demonstrates 2 radiopaque foreign bodies in the cecum and colon ____________________________________________   PROCEDURES  Procedure(s) performed: No  Procedures   Critical Care performed:No ____________________________________________   INITIAL IMPRESSION / ASSESSMENT AND PLAN / ED COURSE  Pertinent labs & imaging results that were available during my care of the patient were reviewed by me and considered in my medical decision making (see chart for details).  Patient with accidental ingestion, foreign bodies are in the colon, no abdominal pain.  Discussed with Dr. Bonna Gains of GI who recommends follow-up with PCP in 1 week for repeat x-ray, if any pain seek care sooner    ____________________________________________   FINAL CLINICAL IMPRESSION(S) / ED DIAGNOSES  Final diagnoses:  Foreign body in stomach  Swallowed foreign body, initial encounter        Note:  This document was prepared using Dragon voice recognition software and may include unintentional dictation errors.    Lavonia Drafts, MD 09/03/17 1536

## 2017-09-03 NOTE — ED Notes (Signed)
esignature not working in this room at this time

## 2017-09-09 DIAGNOSIS — T189XXD Foreign body of alimentary tract, part unspecified, subsequent encounter: Secondary | ICD-10-CM | POA: Diagnosis not present

## 2017-09-09 DIAGNOSIS — T182XXA Foreign body in stomach, initial encounter: Secondary | ICD-10-CM | POA: Diagnosis not present

## 2017-09-17 DIAGNOSIS — I10 Essential (primary) hypertension: Secondary | ICD-10-CM | POA: Diagnosis not present

## 2017-09-17 DIAGNOSIS — I6522 Occlusion and stenosis of left carotid artery: Secondary | ICD-10-CM | POA: Diagnosis not present

## 2017-09-17 DIAGNOSIS — I2510 Atherosclerotic heart disease of native coronary artery without angina pectoris: Secondary | ICD-10-CM | POA: Diagnosis not present

## 2017-09-17 DIAGNOSIS — I25118 Atherosclerotic heart disease of native coronary artery with other forms of angina pectoris: Secondary | ICD-10-CM | POA: Diagnosis not present

## 2017-09-17 DIAGNOSIS — E782 Mixed hyperlipidemia: Secondary | ICD-10-CM | POA: Diagnosis not present

## 2017-09-28 DIAGNOSIS — Z86010 Personal history of colonic polyps: Secondary | ICD-10-CM | POA: Diagnosis not present

## 2017-09-28 DIAGNOSIS — D369 Benign neoplasm, unspecified site: Secondary | ICD-10-CM | POA: Diagnosis not present

## 2017-09-28 DIAGNOSIS — Z955 Presence of coronary angioplasty implant and graft: Secondary | ICD-10-CM | POA: Diagnosis not present

## 2017-09-28 DIAGNOSIS — K644 Residual hemorrhoidal skin tags: Secondary | ICD-10-CM | POA: Diagnosis not present

## 2017-09-28 DIAGNOSIS — Z1211 Encounter for screening for malignant neoplasm of colon: Secondary | ICD-10-CM | POA: Diagnosis not present

## 2017-09-28 DIAGNOSIS — I10 Essential (primary) hypertension: Secondary | ICD-10-CM | POA: Diagnosis not present

## 2017-09-28 DIAGNOSIS — I252 Old myocardial infarction: Secondary | ICD-10-CM | POA: Diagnosis not present

## 2017-09-28 DIAGNOSIS — D120 Benign neoplasm of cecum: Secondary | ICD-10-CM | POA: Diagnosis not present

## 2017-09-28 DIAGNOSIS — D123 Benign neoplasm of transverse colon: Secondary | ICD-10-CM | POA: Diagnosis not present

## 2017-09-28 DIAGNOSIS — Z800 Family history of malignant neoplasm of digestive organs: Secondary | ICD-10-CM | POA: Diagnosis not present

## 2017-09-28 DIAGNOSIS — K635 Polyp of colon: Secondary | ICD-10-CM | POA: Diagnosis not present

## 2017-09-28 DIAGNOSIS — I2510 Atherosclerotic heart disease of native coronary artery without angina pectoris: Secondary | ICD-10-CM | POA: Diagnosis not present

## 2017-09-28 DIAGNOSIS — E119 Type 2 diabetes mellitus without complications: Secondary | ICD-10-CM | POA: Diagnosis not present

## 2017-09-28 DIAGNOSIS — K5730 Diverticulosis of large intestine without perforation or abscess without bleeding: Secondary | ICD-10-CM | POA: Diagnosis not present

## 2017-09-28 DIAGNOSIS — K641 Second degree hemorrhoids: Secondary | ICD-10-CM | POA: Diagnosis not present

## 2017-09-28 LAB — HM COLONOSCOPY

## 2017-10-19 DIAGNOSIS — E118 Type 2 diabetes mellitus with unspecified complications: Secondary | ICD-10-CM | POA: Diagnosis not present

## 2017-10-19 DIAGNOSIS — E782 Mixed hyperlipidemia: Secondary | ICD-10-CM | POA: Diagnosis not present

## 2017-11-02 DIAGNOSIS — E118 Type 2 diabetes mellitus with unspecified complications: Secondary | ICD-10-CM | POA: Diagnosis not present

## 2017-11-02 DIAGNOSIS — E782 Mixed hyperlipidemia: Secondary | ICD-10-CM | POA: Diagnosis not present

## 2017-11-02 DIAGNOSIS — Z23 Encounter for immunization: Secondary | ICD-10-CM | POA: Diagnosis not present

## 2017-11-02 DIAGNOSIS — Z0000 Encounter for general adult medical examination without abnormal findings: Secondary | ICD-10-CM | POA: Diagnosis not present

## 2017-11-02 DIAGNOSIS — L309 Dermatitis, unspecified: Secondary | ICD-10-CM | POA: Diagnosis not present

## 2017-11-02 DIAGNOSIS — I10 Essential (primary) hypertension: Secondary | ICD-10-CM | POA: Diagnosis not present

## 2017-12-10 ENCOUNTER — Other Ambulatory Visit: Payer: Self-pay | Admitting: Family Medicine

## 2017-12-10 DIAGNOSIS — Z1231 Encounter for screening mammogram for malignant neoplasm of breast: Secondary | ICD-10-CM

## 2018-02-07 ENCOUNTER — Ambulatory Visit (INDEPENDENT_AMBULATORY_CARE_PROVIDER_SITE_OTHER): Payer: PPO

## 2018-02-07 ENCOUNTER — Encounter (INDEPENDENT_AMBULATORY_CARE_PROVIDER_SITE_OTHER): Payer: Self-pay

## 2018-02-07 ENCOUNTER — Encounter (INDEPENDENT_AMBULATORY_CARE_PROVIDER_SITE_OTHER): Payer: Self-pay | Admitting: Vascular Surgery

## 2018-02-07 ENCOUNTER — Ambulatory Visit (INDEPENDENT_AMBULATORY_CARE_PROVIDER_SITE_OTHER): Payer: PPO | Admitting: Vascular Surgery

## 2018-02-07 VITALS — BP 139/70 | HR 53 | Resp 15 | Ht 62.0 in | Wt 178.0 lb

## 2018-02-07 DIAGNOSIS — I10 Essential (primary) hypertension: Secondary | ICD-10-CM | POA: Diagnosis not present

## 2018-02-07 DIAGNOSIS — I6523 Occlusion and stenosis of bilateral carotid arteries: Secondary | ICD-10-CM

## 2018-02-07 DIAGNOSIS — E782 Mixed hyperlipidemia: Secondary | ICD-10-CM | POA: Diagnosis not present

## 2018-02-07 DIAGNOSIS — E1159 Type 2 diabetes mellitus with other circulatory complications: Secondary | ICD-10-CM | POA: Diagnosis not present

## 2018-02-07 NOTE — Progress Notes (Signed)
Subjective:    Patient ID: Susan Allen, female    DOB: Sep 23, 1940, 77 y.o.   MRN: [id number] Chief Complaint  Patient presents with  . Carotid    1 year Carotid F/U   Patient presents for a yearly non-invasive study follow up for carotid stenosis. The stenosis has been followed by surveillance duplexes. The patient underwent a bilateral carotid duplex scan which showed no change from the previous exam on 02/04/17. Duplex is stable at Right ICA stenosis (1-39%) and Left ICA stenosis (40-59%).  Bilateral vertebral arteries demonstrate antegrade flow.  Normal flow hemodynamics were seen in the bilateral subclavian arteries.  The patient denies experiencing Amaurosis Fugax, TIA like symptoms or focal motor deficits.  Denies any fever, nausea vomiting.  Review of Systems  Constitutional: Negative.   HENT: Negative.   Eyes: Negative.   Respiratory: Negative.   Cardiovascular: Negative.   Gastrointestinal: Negative.   Endocrine: Negative.   Genitourinary: Negative.   Musculoskeletal: Negative.   Skin: Negative.   Allergic/Immunologic: Negative.   Neurological: Negative.   Hematological: Negative.   Psychiatric/Behavioral: Negative.       Objective:   Physical Exam  Constitutional: She is oriented to person, place, and time. She appears well-developed and well-nourished. No distress.  HENT:  Head: Normocephalic and atraumatic.  Right Ear: External ear normal.  Left Ear: External ear normal.  Eyes: Pupils are equal, round, and reactive to light. Conjunctivae and EOM are normal.  Neck: Normal range of motion.  No carotid bruits auscultated.  Cardiovascular: Normal rate, regular rhythm, normal heart sounds and intact distal pulses.  Pulses:      Radial pulses are 2+ on the right side, and 2+ on the left side.  Pulmonary/Chest: Effort normal and breath sounds normal.  Musculoskeletal: Normal range of motion. She exhibits no edema.  Neurological: She is alert and oriented to person,  place, and time.  Skin: Skin is warm and dry. She is not diaphoretic.  Psychiatric: She has a normal mood and affect. Her behavior is normal. Judgment and thought content normal.  Vitals reviewed.  BP 139/70 (BP Location: Left Arm, Patient Position: Sitting)   Pulse (!) 53   Resp 15   Ht 5\' 2"  (1.575 m)   Wt 178 lb (80.7 kg)   BMI 32.56 kg/m   Past Medical History:  Diagnosis Date  . Anxiety   . Arthritis   . Bronchitis   . Coronary artery disease   . Diabetes mellitus without complication ([place])   . GERD (gastroesophageal reflux disease)   . Headache   . Hypertension   . Myocardial infarction [facility]) June 2014, July 2014   Social History   Socioeconomic History  . Marital status: Married    Spouse name: Not on file  . Number of children: Not on file  . Years of education: Not on file  . Highest education level: Not on file  Occupational History  . Not on file  Social Needs  . Financial resource strain: Not on file  . Food insecurity:    Worry: Not on file    Inability: Not on file  . Transportation needs:    Medical: Not on file    Non-medical: Not on file  Tobacco Use  . Smoking status: Former Smoker    Packs/day: 0.25    Types: Cigarettes    Last attempt to quit: 02/23/2004    Years since quitting: 13.9  . Smokeless tobacco: Never Used  Substance and Sexual Activity  .  Alcohol use: No  . Drug use: No  . Sexual activity: Not on file  Lifestyle  . Physical activity:    Days per week: Not on file    Minutes per session: Not on file  . Stress: Not on file  Relationships  . Social connections:    Talks on phone: Not on file    Gets together: Not on file    Attends religious service: Not on file    Active member of club or organization: Not on file    Attends meetings of clubs or organizations: Not on file    Relationship status: Not on file  . Intimate partner violence:    Fear of current or ex partner: Not on file    Emotionally abused: Not on file     Physically abused: Not on file    Forced sexual activity: Not on file  Other Topics Concern  . Not on file  Social History Narrative  . Not on file   Past Surgical History:  Procedure Laterality Date  . ABDOMINAL HYSTERECTOMY    . CORONARY ANGIOPLASTY     with stents   . DIGIT NAIL REMOVAL Bilateral 08/30/2015   Procedure: REMOVAL OF DIGIT NAILS/ BIL. PERM.REMOVAL INGROWN GREAT TOE NAILS;  Surgeon: Albertine Patricia, DPM;  Location: [facility] ORS;  Service: Podiatry;  Laterality: Bilateral;  . 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 JOINT REPLACEMENT Bilateral    Total Knee Replacement, Dr Burnis Kingfisher, [facility]  . TONSILLECTOMY     Family History  Problem Relation Age of Onset  . Colon cancer Mother 59  . Breast cancer Neg Hx    Allergies  Allergen Reactions  . Aspirin Hives and Other (See Comments)    "fainting"  . Lisinopril Cough      Assessment & Plan:  Patient presents for a yearly non-invasive study follow up for carotid stenosis. The stenosis has been followed by surveillance duplexes. The patient underwent a bilateral carotid duplex scan which showed no change from the previous exam on 02/04/17. Duplex is stable at Right ICA stenosis (1-39%) and Left ICA stenosis (40-59%).  Bilateral vertebral arteries demonstrate antegrade flow.  Normal flow hemodynamics were seen in the bilateral subclavian arteries.  The patient denies experiencing Amaurosis Fugax, TIA like symptoms or focal motor deficits.  Denies any fever, nausea vomiting.  1. Bilateral carotid artery stenosis - Stable Studies reviewed with patient. Patient asymptomatic with stable duplex.  No intervention at this time.  Patient to return in one year for surveillance carotid duplex. Patient to continue medical optimization with plavix and dyslipidemia  medication. Patient to remain abstinent of tobacco use. I have discussed with the patient at length the risk factors for and pathogenesis of atherosclerotic disease and encouraged a healthy diet, regular exercise regimen and blood pressure / glucose control.  Patient was instructed to contact our office in the interim with problems such as arm / leg weakness or numbness, speech / swallowing difficulty or temporary monocular blindness. The patient expresses their understanding.   - VAS US CAROTID; Future  2. Mixed hyperlipidemia - Stable On statin. Encouraged good control as its slows the progression of atherosclerotic disease  3. Type 2 diabetes mellitus with other circulatory complication, without long-term current use of insulin (HCC) - Stable Encouraged good control as its slows the progression of atherosclerotic disease  4.  Essential hypertension - Stable Encouraged good control as its slows the progression of atherosclerotic disease  Current Outpatient Medications on File Prior to Visit  Medication Sig Dispense Refill  . albuterol (PROVENTIL HFA;VENTOLIN HFA) 108 (90 Base) MCG/ACT inhaler Inhale 2 puffs into the lungs every 4 (four) hours as needed for wheezing or shortness of breath. Reported on 08/30/2015    . alum & mag hydroxide-simeth (MYLANTA) 094-076-80 MG/5ML suspension Take 15 mLs by mouth every 6 (six) hours as needed for indigestion or heartburn.    . cetirizine (ZYRTEC) 10 MG tablet Take 10 mg by mouth as needed for allergies.    Marland Kitchen clopidogrel (PLAVIX) 75 MG tablet Take by mouth.    Marland Kitchen Co-Enzyme Q-10 30 MG CAPS Take by mouth.    . diphenhydrAMINE (SOMINEX) 25 MG tablet Take 25 mg by mouth at bedtime as needed for sleep.    . fenofibrate (TRICOR) 145 MG tablet Take 145 mg by mouth at bedtime.    Marland Kitchen FLUoxetine (PROZAC) 40 MG capsule Take by mouth.    . isosorbide mononitrate (IMDUR) 30 MG 24 hr tablet Take 30 mg by mouth daily.    . Lactobacillus Rhamnosus, GG, (CULTURELLE)  CAPS Take by mouth.    . losartan (COZAAR) 25 MG tablet Take 25 mg by mouth daily.    . metFORMIN (GLUCOPHAGE) 500 MG tablet Take 500 mg by mouth 2 (two) times daily with a meal.    . metoprolol tartrate (LOPRESSOR) 25 MG tablet Take 37.5 mg by mouth 2 (two) times daily.    . Multiple Vitamins-Minerals (CENTRUM) tablet Take 1 tablet by mouth daily.    . nitroGLYCERIN (NITROSTAT) 0.4 MG SL tablet Place 0.4 mg under the tongue every 5 (five) minutes as needed for chest pain.    Marland Kitchen omeprazole (PRILOSEC) 40 MG capsule Take by mouth.    . oxyCODONE-acetaminophen (PERCOCET) 7.5-325 MG tablet Take 1 tablet by mouth every 4 (four) hours as needed for severe pain. 30 tablet 0  . PARoxetine (PAXIL) 20 MG tablet Take 20 mg by mouth at bedtime.    . pravastatin (PRAVACHOL) 40 MG tablet Take 40 mg by mouth at bedtime.    . ranitidine (ZANTAC) 150 MG tablet Take 150 mg by mouth every morning.    . traZODone (DESYREL) 50 MG tablet Take 50 mg by mouth at bedtime.     No current facility-administered medications on file prior to visit.    There are no Patient Instructions on file for this visit. No follow-ups on file.  Jocie Meroney A Wilda Wetherell, PA-C

## 2018-02-18 ENCOUNTER — Ambulatory Visit
Admission: RE | Admit: 2018-02-18 | Discharge: 2018-02-18 | Disposition: A | Discharge status: Home / Self Care | Payer: PPO | Source: Ambulatory Visit | Attending: Family Medicine | Admitting: Family Medicine

## 2018-02-18 DIAGNOSIS — Z1231 Encounter for screening mammogram for malignant neoplasm of breast: Secondary | ICD-10-CM

## 2018-03-24 DIAGNOSIS — I25118 Atherosclerotic heart disease of native coronary artery with other forms of angina pectoris: Secondary | ICD-10-CM | POA: Diagnosis not present

## 2018-03-24 DIAGNOSIS — E118 Type 2 diabetes mellitus with unspecified complications: Secondary | ICD-10-CM | POA: Diagnosis not present

## 2018-03-24 DIAGNOSIS — E782 Mixed hyperlipidemia: Secondary | ICD-10-CM | POA: Diagnosis not present

## 2018-03-24 DIAGNOSIS — I10 Essential (primary) hypertension: Secondary | ICD-10-CM | POA: Diagnosis not present

## 2018-03-24 DIAGNOSIS — I2510 Atherosclerotic heart disease of native coronary artery without angina pectoris: Secondary | ICD-10-CM | POA: Diagnosis not present

## 2018-03-24 DIAGNOSIS — I6522 Occlusion and stenosis of left carotid artery: Secondary | ICD-10-CM | POA: Diagnosis not present

## 2018-05-03 DIAGNOSIS — E782 Mixed hyperlipidemia: Secondary | ICD-10-CM | POA: Diagnosis not present

## 2018-05-03 DIAGNOSIS — I10 Essential (primary) hypertension: Secondary | ICD-10-CM | POA: Diagnosis not present

## 2018-05-03 DIAGNOSIS — E118 Type 2 diabetes mellitus with unspecified complications: Secondary | ICD-10-CM | POA: Diagnosis not present

## 2018-05-03 LAB — BASIC METABOLIC PANEL
BUN: 14 (ref 4–21)
Creatinine: 0.7 (ref 0.5–1.1)
GLUCOSE: 144
POTASSIUM: 4.4 (ref 3.4–5.3)
Sodium: 143 (ref 137–147)

## 2018-05-03 LAB — LIPID PANEL
CHOLESTEROL: 147 (ref 0–200)
HDL: 39 (ref 35–70)
LDL CALC: 79
Triglycerides: 149 (ref 40–160)

## 2018-05-03 LAB — HEPATIC FUNCTION PANEL
ALT: 8 (ref 7–35)
AST: 13 (ref 13–35)
Alkaline Phosphatase: 41 (ref 25–125)
Bilirubin, Total: 0.4

## 2018-05-03 LAB — HEMOGLOBIN A1C: Hemoglobin A1C: 7.1

## 2018-06-01 ENCOUNTER — Ambulatory Visit (INDEPENDENT_AMBULATORY_CARE_PROVIDER_SITE_OTHER): Payer: PPO | Admitting: Family Medicine

## 2018-06-01 ENCOUNTER — Ambulatory Visit
Admission: RE | Admit: 2018-06-01 | Discharge: 2018-06-01 | Disposition: A | Discharge status: Home / Self Care | Payer: PPO | Source: Ambulatory Visit | Attending: Family Medicine | Admitting: Family Medicine

## 2018-06-01 ENCOUNTER — Encounter: Payer: Self-pay | Admitting: Family Medicine

## 2018-06-01 VITALS — BP 178/77 | HR 55 | Temp 98.6°F | Ht 62.7 in | Wt 175.6 lb

## 2018-06-01 DIAGNOSIS — M79672 Pain in left foot: Secondary | ICD-10-CM

## 2018-06-01 DIAGNOSIS — E782 Mixed hyperlipidemia: Secondary | ICD-10-CM | POA: Diagnosis not present

## 2018-06-01 DIAGNOSIS — E1159 Type 2 diabetes mellitus with other circulatory complications: Secondary | ICD-10-CM

## 2018-06-01 DIAGNOSIS — M19072 Primary osteoarthritis, left ankle and foot: Secondary | ICD-10-CM | POA: Diagnosis not present

## 2018-06-01 DIAGNOSIS — I10 Essential (primary) hypertension: Secondary | ICD-10-CM

## 2018-06-01 DIAGNOSIS — F339 Major depressive disorder, recurrent, unspecified: Secondary | ICD-10-CM | POA: Diagnosis not present

## 2018-06-01 MED ORDER — CLOPIDOGREL BISULFATE 75 MG PO TABS: 75.0000 mg | ORAL_TABLET | Freq: Once | ORAL | 1 refills | Status: AC

## 2018-06-01 MED ORDER — METOPROLOL TARTRATE 25 MG PO TABS: 37.5000 mg | ORAL_TABLET | Freq: Two times a day (BID) | ORAL | 1 refills | Status: DC

## 2018-06-01 MED ORDER — METFORMIN HCL 500 MG PO TABS: 1000.0000 mg | ORAL_TABLET | Freq: Two times a day (BID) | ORAL | 1 refills | Status: DC

## 2018-06-01 MED ORDER — TRAZODONE HCL 50 MG PO TABS: 50.0000 mg | ORAL_TABLET | Freq: Every day | ORAL | 1 refills | Status: DC

## 2018-06-01 MED ORDER — LOSARTAN POTASSIUM 25 MG PO TABS: 25.0000 mg | ORAL_TABLET | Freq: Every day | ORAL | 1 refills | Status: DC

## 2018-06-01 MED ORDER — ISOSORBIDE MONONITRATE ER 30 MG PO TB24: 30.0000 mg | ORAL_TABLET | Freq: Every day | ORAL | 1 refills | Status: DC

## 2018-06-01 MED ORDER — PRAVASTATIN SODIUM 40 MG PO TABS: 80.0000 mg | ORAL_TABLET | Freq: Every day | ORAL | 1 refills | Status: DC

## 2018-06-01 MED ORDER — PAROXETINE HCL 20 MG PO TABS: 40.0000 mg | ORAL_TABLET | Freq: Every day | ORAL | 1 refills | Status: DC

## 2018-06-01 MED ORDER — FENOFIBRATE 145 MG PO TABS: 145.0000 mg | ORAL_TABLET | Freq: Every day | ORAL | 1 refills | Status: DC

## 2018-06-01 NOTE — Progress Notes (Signed)
BP (!) 178/77 (BP Location: Left Arm, Patient Position: Sitting, Cuff Size: Normal)   Pulse (!) 55   Temp 98.6 F (37 C)   Ht 5' 2.7" (1.593 m)   Wt 175 lb 9 oz (79.6 kg)   SpO2 95%   BMI 31.40 kg/m    Subjective:    Patient ID: Susan Allen, female    DOB: [birth year], 78 y.o.   MRN: [id number]  HPI: Susan Allen is a 78 y.o. female  Chief Complaint  Patient presents with  . Establish Care  . Diabetes    Last A1C 05/03/18 at 7.1   FOOT PAIN Duration: a little over a week- no injury Involved foot: right Mechanism of injury: unknown Location: lateral L foot Onset: sudden  Severity: mild  Quality:  sharp Frequency: with touching it or walking on it Radiation: no Aggravating factors: weight bearing and walking  Alleviating factors: nothing  Status: stable Treatments attempted: APAP  Relief with NSAIDs?:  No NSAIDs Taken Weakness with weight bearing or walking: no Morning stiffness: no Swelling: no Redness: no Bruising: no Paresthesias / decreased sensation: no  Fevers:no  HYPERTENSION / HYPERLIPIDEMIA Satisfied with current treatment? yes Duration of hypertension: chronic BP monitoring frequency: not checking BP medication side effects: no Past BP meds: imdur, losartan, metroprolol Duration of hyperlipidemia: chronic Cholesterol medication side effects: no Cholesterol supplements: none Past cholesterol medications: pravastatin Medication compliance: excellent compliance Aspirin: no Recent stressors: no Recurrent headaches: no Visual changes: no Palpitations: no Dyspnea: no Chest pain: no Lower extremity edema: no Dizzy/lightheaded: no  DIABETES Hypoglycemic episodes:no Polydipsia/polyuria: no Visual disturbance: no Chest pain: no Paresthesias: no Glucose Monitoring: no  Accucheck frequency: Not Checking Taking Insulin?: no Blood Pressure Monitoring: not checking Retinal Examination: Up to Date Foot Exam: Not up to Date Diabetic Education:  Completed Pneumovax: Up to Date Influenza: Up to Date Aspirin: yes  DEPRESSION Mood status: stable Satisfied with current treatment?: yes Symptom severity: moderate  Duration of current treatment : months Side effects: no Medication compliance: excellent compliance Psychotherapy/counseling: no  Previous psychiatric medications: paxil Depressed mood: yes Anxious mood: no Anhedonia: no Significant weight loss or gain: no Insomnia: yes- does well with trazodone  Fatigue: yes Feelings of worthlessness or guilt: no Impaired concentration/indecisiveness: no Suicidal ideations: no Hopelessness: no Crying spells: no Depression screen PHQ 2/9 06/01/2018  Decreased Interest 3  Down, Depressed, Hopeless 3  PHQ - 2 Score 6  Altered sleeping 3  Tired, decreased energy 3  Change in appetite 0  Feeling bad or failure about yourself  1  Trouble concentrating 0  Moving slowly or fidgety/restless 0  Suicidal thoughts 0  PHQ-9 Score 13  Difficult doing work/chores Somewhat difficult    Relevant past medical, surgical, family and social history reviewed and updated as indicated. Interim medical history since our last visit reviewed. Allergies and medications reviewed and updated.  Review of Systems  Constitutional: Negative.   Respiratory: Negative.   Cardiovascular: Negative.   Musculoskeletal: Positive for arthralgias and myalgias. Negative for back pain, gait problem, joint swelling, neck pain and neck stiffness.  Skin: Negative.   Neurological: Negative.   Psychiatric/Behavioral: Negative.     Per HPI unless specifically indicated above     Objective:    BP (!) 178/77 (BP Location: Left Arm, Patient Position: Sitting, Cuff Size: Normal)   Pulse (!) 55   Temp 98.6 F (37 C)   Ht 5' 2.7" (1.593 m)   Wt 175 lb 9 oz (79.6  kg)   SpO2 95%   BMI 31.40 kg/m   Wt Readings from Last 3 Encounters:  06/01/18 175 lb 9 oz (79.6 kg)  02/07/18 178 lb (80.7 kg)  09/03/17 184 lb  (83.5 kg)    Physical Exam Vitals signs and nursing note reviewed.  Constitutional:      General: She is not in acute distress.    Appearance: Normal appearance. She is not ill-appearing, toxic-appearing or diaphoretic.  HENT:     Head: Normocephalic and atraumatic.     Right Ear: External ear normal.     Left Ear: External ear normal.     Nose: Nose normal.     Mouth/Throat:     Mouth: Mucous membranes are moist.     Pharynx: Oropharynx is clear.  Eyes:     General: No scleral icterus.       Right eye: No discharge.        Left eye: No discharge.     Extraocular Movements: Extraocular movements intact.     Conjunctiva/sclera: Conjunctivae normal.     Pupils: Pupils are equal, round, and reactive to light.  Neck:     Musculoskeletal: Normal range of motion and neck supple.  Cardiovascular:     Rate and Rhythm: Normal rate and regular rhythm.     Pulses: Normal pulses.     Heart sounds: Normal heart sounds. No murmur. No friction rub. No gallop.   Pulmonary:     Effort: Pulmonary effort is normal. No respiratory distress.     Breath sounds: Normal breath sounds. No stridor. No wheezing, rhonchi or rales.  Chest:     Chest wall: No tenderness.  Musculoskeletal: Normal range of motion.  Skin:    General: Skin is warm and dry.     Capillary Refill: Capillary refill takes less than 2 seconds.     Coloration: Skin is not jaundiced or pale.     Findings: No bruising, erythema, lesion or rash.  Neurological:     General: No focal deficit present.     Mental Status: She is alert and oriented to person, place, and time. Mental status is at baseline.  Psychiatric:        Mood and Affect: Mood normal.        Behavior: Behavior normal.        Thought Content: Thought content normal.        Judgment: Judgment normal.     Results for orders placed or performed in visit on 41/96/22  Basic metabolic panel  Result Value Ref Range   Glucose 144    BUN 14 4 - 21   Creatinine 0.7  0.5 - 1.1   Potassium 4.4 3.4 - 5.3   Sodium 143 137 - 147  Lipid panel  Result Value Ref Range   Triglycerides 149 40 - 160   Cholesterol 147 0 - 200   HDL 39 35 - 70   LDL Cholesterol 79   Hepatic function panel  Result Value Ref Range   Alkaline Phosphatase 41 25 - 125   ALT 8 7 - 35   AST 13 13 - 35   Bilirubin, Total 0.4   Hemoglobin A1c  Result Value Ref Range   Hemoglobin A1C 7.1   HM DIABETES EYE EXAM  Result Value Ref Range   HM Diabetic Eye Exam No Retinopathy No Retinopathy  HM COLONOSCOPY  Result Value Ref Range   HM Colonoscopy See Report (in chart) See Report (in chart), Patient  Reported      Assessment & Plan:   Problem List Items Addressed This Visit      Cardiovascular and Mediastinum   Essential hypertension    Not under good control today- possibly white coat related. Will work on [employer] and monitor at home and recheck 2 months. Call with any concerns.       Relevant Medications   losartan (COZAAR) 25 MG tablet   metoprolol tartrate (LOPRESSOR) 25 MG tablet   isosorbide mononitrate (IMDUR) 30 MG 24 hr tablet   fenofibrate (TRICOR) 145 MG tablet   pravastatin (PRAVACHOL) 40 MG tablet     Endocrine   Diabetes ([place]) - Primary    Last A1c was 7.1 in December. Will continue current regimen and recheck in March. Call with any concerns.       Relevant Medications   losartan (COZAAR) 25 MG tablet   metFORMIN (GLUCOPHAGE) 500 MG tablet   pravastatin (PRAVACHOL) 40 MG tablet     Other   Hyperlipidemia    Under good control on current regimen. Continue current regimen. Continue to monitor. Call with any concerns. Refills given.        Relevant Medications   losartan (COZAAR) 25 MG tablet   metoprolol tartrate (LOPRESSOR) 25 MG tablet   isosorbide mononitrate (IMDUR) 30 MG 24 hr tablet   fenofibrate (TRICOR) 145 MG tablet   pravastatin (PRAVACHOL) 40 MG tablet   Depression, recurrent (HCC)    Stable on current regimen. Does not want to  change. Will continue current regimen. Refills given today. Call with any concerns.       Relevant Medications   PARoxetine (PAXIL) 20 MG tablet   traZODone (DESYREL) 50 MG tablet    Other Visit Diagnoses    Left foot pain       Will obtain x-ray to look for cause. Await results. Call with any concerns.    Relevant Orders   DG Foot Complete Left       Follow up plan: Return in about 2 months (around 07/31/2018) for DM follow up.

## 2018-06-01 NOTE — Assessment & Plan Note (Signed)
Not under good control today- possibly white coat related. Will work on [employer] and monitor at home and recheck 2 months. Call with any concerns.

## 2018-06-01 NOTE — Assessment & Plan Note (Signed)
Under good control on current regimen. Continue current regimen. Continue to monitor. Call with any concerns. Refills given.   

## 2018-06-01 NOTE — Assessment & Plan Note (Signed)
Stable on current regimen. Does not want to change. Will continue current regimen. Refills given today. Call with any concerns.

## 2018-06-01 NOTE — Assessment & Plan Note (Signed)
Last A1c was 7.1 in December. Will continue current regimen and recheck in March. Call with any concerns.

## 2018-06-02 ENCOUNTER — Telehealth: Payer: Self-pay | Admitting: Family Medicine

## 2018-06-02 MED ORDER — DICLOFENAC SODIUM 1 % TD GEL: 4.0000 g | Freq: Four times a day (QID) | TRANSDERMAL | 3 refills | Status: DC

## 2018-06-02 NOTE — Telephone Encounter (Signed)
Message relayed to patient. Verbalized understanding and denied questions.   

## 2018-06-02 NOTE — Telephone Encounter (Signed)
Please let her know that her x-ray came back normal except some arthritis- I've sent her a cream to put on it that will help the pain and not effect her kidneys or blood pressure.

## 2018-09-15 DIAGNOSIS — I10 Essential (primary) hypertension: Secondary | ICD-10-CM | POA: Diagnosis not present

## 2018-09-15 DIAGNOSIS — E782 Mixed hyperlipidemia: Secondary | ICD-10-CM | POA: Diagnosis not present

## 2018-09-15 DIAGNOSIS — I6522 Occlusion and stenosis of left carotid artery: Secondary | ICD-10-CM | POA: Diagnosis not present

## 2018-09-15 DIAGNOSIS — Z955 Presence of coronary angioplasty implant and graft: Secondary | ICD-10-CM | POA: Diagnosis not present

## 2018-09-15 DIAGNOSIS — I2510 Atherosclerotic heart disease of native coronary artery without angina pectoris: Secondary | ICD-10-CM | POA: Diagnosis not present

## 2018-09-15 DIAGNOSIS — E118 Type 2 diabetes mellitus with unspecified complications: Secondary | ICD-10-CM | POA: Diagnosis not present

## 2018-11-28 ENCOUNTER — Other Ambulatory Visit: Payer: Self-pay | Admitting: Family Medicine

## 2018-11-28 NOTE — Telephone Encounter (Signed)
Requested Prescriptions  Pending Prescriptions Disp Refills  . metoprolol tartrate (LOPRESSOR) 25 MG tablet [Pharmacy Med Name: Metoprolol Tartrate 25 MG Oral Tablet] 270 tablet 0    Sig: TAKE 1 & 1/2 (ONE & ONE-HALF) TABLETS BY MOUTH TWICE DAILY     Cardiovascular:  Beta Blockers Failed - 11/28/2018  8:39 AM      Failed - Last BP in normal range    BP Readings from Last 1 Encounters:  06/01/18 (!) 178/77         Passed - Last Heart Rate in normal range    Pulse Readings from Last 1 Encounters:  06/01/18 (!) 55         Passed - Valid encounter within last 6 months    Recent Outpatient Visits          6 months ago Type 2 diabetes mellitus with other circulatory complication, without long-term current use of insulin ([place])   [place], [place], DO

## 2018-12-12 ENCOUNTER — Other Ambulatory Visit: Payer: Self-pay | Admitting: Family Medicine

## 2018-12-12 NOTE — Telephone Encounter (Signed)
Requested Prescriptions  Pending Prescriptions Disp Refills  . pravastatin (PRAVACHOL) 40 MG tablet [Pharmacy Med Name: Pravastatin Sodium 40 MG Oral Tablet] 180 tablet 0    Sig: TAKE 2 TABLETS BY MOUTH AT BEDTIME     Cardiovascular:  Antilipid - Statins Passed - 12/12/2018 10:22 AM      Passed - Total Cholesterol in normal range and within 360 days    Cholesterol  Date Value Ref Range Status  05/03/2018 147 0 - 200 Final  11/20/2012 235 (H) 0 - 200 mg/dL Final         Passed - LDL in normal range and within 360 days    Ldl Cholesterol, Calc  Date Value Ref Range Status  11/20/2012 144 (H) 0 - 100 mg/dL Final   LDL Cholesterol  Date Value Ref Range Status  05/03/2018 79  Final         Passed - HDL in normal range and within 360 days    HDL Cholesterol  Date Value Ref Range Status  11/20/2012 37 (L) 40 - 60 mg/dL Final   HDL  Date Value Ref Range Status  05/03/2018 39 35 - 70 Final         Passed - Triglycerides in normal range and within 360 days    Triglycerides  Date Value Ref Range Status  05/03/2018 149 40 - 160 Final  11/20/2012 272 (H) 0 - 200 mg/dL Final         Passed - Patient is not pregnant      Passed - Valid encounter within last 12 months    Recent Outpatient Visits          6 months ago Type 2 diabetes mellitus with other circulatory complication, without long-term current use of insulin ([place])   [place], [place], DO      Future Appointments            In 1 month Johnson, Barb Merino, DO [employer], [facility]           . PARoxetine (PAXIL) 20 MG tablet [Pharmacy Med Name: PARoxetine HCl 20 MG Oral Tablet] 180 tablet 0    Sig: TAKE 2 TABLETS BY MOUTH AT BEDTIME     Psychiatry:  Antidepressants - SSRI Failed - 12/12/2018 10:22 AM      Failed - Valid encounter within last 6 months    Recent Outpatient Visits          6 months ago Type 2 diabetes mellitus with other circulatory complication, without long-term  current use of insulin ([place])   [place], [place], DO      Future Appointments            In 1 month Johnson, Megan P, DO [place], [facility]           Passed - Completed PHQ-2 or PHQ-9 in the last 360 days.      Patient scheduled for follow up appointment on 01/20/2019.

## 2019-01-02 ENCOUNTER — Other Ambulatory Visit: Payer: Self-pay | Admitting: Family Medicine

## 2019-01-02 NOTE — Telephone Encounter (Signed)
Requested Prescriptions  Pending Prescriptions Disp Refills  . fenofibrate (TRICOR) 145 MG tablet [Pharmacy Med Name: Fenofibrate 145 MG Oral Tablet] 30 tablet 0    Sig: TAKE 1 TABLET BY MOUTH AT BEDTIME     Cardiovascular:  Antilipid - Fibric Acid Derivatives Failed - 01/02/2019  9:32 AM      Failed - ALT in normal range and within 180 days    ALT  Date Value Ref Range Status  05/03/2018 8 7 - 35 Final   SGPT (ALT)  Date Value Ref Range Status  02/17/2013 28 12 - 78 U/L Final         Failed - AST in normal range and within 180 days    AST  Date Value Ref Range Status  05/03/2018 13 13 - 35 Final   SGOT(AST)  Date Value Ref Range Status  02/17/2013 25 15 - 37 Unit/L Final         Failed - Cr in normal range and within 180 days    Creatinine  Date Value Ref Range Status  05/03/2018 0.7 0.5 - 1.1 Final  02/19/2013 0.86 0.60 - 1.30 mg/dL Final   Creatinine, Ser  Date Value Ref Range Status  08/26/2015 0.76 0.44 - 1.00 mg/dL Final         Failed - eGFR in normal range and within 180 days    EGFR (African American)  Date Value Ref Range Status  02/19/2013 >60  Final   GFR calc Af Amer  Date Value Ref Range Status  08/26/2015 >60 >60 mL/min Final    Comment:    (NOTE) The eGFR has been calculated using the CKD EPI equation. This calculation has not been validated in all clinical situations. eGFR's persistently <60 mL/min signify possible Chronic Kidney Disease.    EGFR (Non-African Amer.)  Date Value Ref Range Status  02/19/2013 >60  Final    Comment:    eGFR values <70m/min/1.73 m2 may be an indication of chronic kidney disease (CKD). Calculated eGFR is useful in patients with stable renal function. The eGFR calculation will not be reliable in acutely ill patients when serum creatinine is changing rapidly. It is not useful in  patients on dialysis. The eGFR calculation may not be applicable to patients at the low and high extremes of body sizes,  pregnant women, and vegetarians.    GFR calc non Af Amer  Date Value Ref Range Status  08/26/2015 >60 >60 mL/min Final         Passed - Total Cholesterol in normal range and within 360 days    Cholesterol  Date Value Ref Range Status  05/03/2018 147 0 - 200 Final  11/20/2012 235 (H) 0 - 200 mg/dL Final         Passed - LDL in normal range and within 360 days    Ldl Cholesterol, Calc  Date Value Ref Range Status  11/20/2012 144 (H) 0 - 100 mg/dL Final   LDL Cholesterol  Date Value Ref Range Status  05/03/2018 79  Final         Passed - HDL in normal range and within 360 days    HDL Cholesterol  Date Value Ref Range Status  11/20/2012 37 (L) 40 - 60 mg/dL Final   HDL  Date Value Ref Range Status  05/03/2018 39 35 - 70 Final         Passed - Triglycerides in normal range and within 360 days    Triglycerides  Date Value  Ref Range Status  05/03/2018 149 40 - 160 Final  11/20/2012 272 (H) 0 - 200 mg/dL Final         Passed - Valid encounter within last 12 months    Recent Outpatient Visits          7 months ago Type 2 diabetes mellitus with other circulatory complication, without long-term current use of insulin [facility]   [place], [place], DO      Future Appointments            In 2 weeks Wynetta Emery, Barb Merino, DO [facility]

## 2019-01-18 ENCOUNTER — Other Ambulatory Visit: Payer: Self-pay | Admitting: Family Medicine

## 2019-01-18 DIAGNOSIS — Z1231 Encounter for screening mammogram for malignant neoplasm of breast: Secondary | ICD-10-CM

## 2019-01-20 ENCOUNTER — Other Ambulatory Visit: Payer: Self-pay

## 2019-01-20 ENCOUNTER — Encounter: Payer: Self-pay | Admitting: Family Medicine

## 2019-01-20 ENCOUNTER — Ambulatory Visit (INDEPENDENT_AMBULATORY_CARE_PROVIDER_SITE_OTHER): Payer: PPO | Admitting: Family Medicine

## 2019-01-20 VITALS — BP 157/62 | HR 98 | Temp 98.8°F | Ht 63.0 in | Wt 193.5 lb

## 2019-01-20 DIAGNOSIS — I25118 Atherosclerotic heart disease of native coronary artery with other forms of angina pectoris: Secondary | ICD-10-CM

## 2019-01-20 DIAGNOSIS — Z0000 Encounter for general adult medical examination without abnormal findings: Secondary | ICD-10-CM | POA: Diagnosis not present

## 2019-01-20 DIAGNOSIS — E782 Mixed hyperlipidemia: Secondary | ICD-10-CM | POA: Diagnosis not present

## 2019-01-20 DIAGNOSIS — I10 Essential (primary) hypertension: Secondary | ICD-10-CM

## 2019-01-20 DIAGNOSIS — F339 Major depressive disorder, recurrent, unspecified: Secondary | ICD-10-CM

## 2019-01-20 DIAGNOSIS — E1159 Type 2 diabetes mellitus with other circulatory complications: Secondary | ICD-10-CM

## 2019-01-20 LAB — UA/M W/RFLX CULTURE, ROUTINE
Bilirubin, UA: NEGATIVE
Glucose, UA: NEGATIVE
Ketones, UA: NEGATIVE
Leukocytes,UA: NEGATIVE
Nitrite, UA: NEGATIVE
Protein,UA: NEGATIVE
RBC, UA: NEGATIVE
Specific Gravity, UA: 1.025 (ref 1.005–1.030)
Urobilinogen, Ur: 1 mg/dL (ref 0.2–1.0)
pH, UA: 6.5 (ref 5.0–7.5)

## 2019-01-20 LAB — MICROALBUMIN, URINE WAIVED
Creatinine, Urine Waived: 100 mg/dL (ref 10–300)
Microalb, Ur Waived: 10 mg/L (ref 0–19)
Microalb/Creat Ratio: <30 mg/g (ref <30)

## 2019-01-20 LAB — BAYER DCA HB A1C WAIVED: HB A1C (BAYER DCA - WAIVED): 7.2 % — ABNORMAL HIGH (ref <7.0)

## 2019-01-20 MED ORDER — METFORMIN HCL 500 MG PO TABS: 1000.0000 mg | ORAL_TABLET | Freq: Two times a day (BID) | ORAL | 1 refills | Status: DC

## 2019-01-20 MED ORDER — PRAVASTATIN SODIUM 40 MG PO TABS: 80.0000 mg | ORAL_TABLET | Freq: Every day | ORAL | 1 refills | Status: DC

## 2019-01-20 MED ORDER — TRAZODONE HCL 50 MG PO TABS: 50.0000 mg | ORAL_TABLET | Freq: Every day | ORAL | 1 refills | Status: DC

## 2019-01-20 MED ORDER — DICLOFENAC SODIUM 1 % TD GEL: 4.0000 g | Freq: Four times a day (QID) | TRANSDERMAL | 3 refills | Status: DC

## 2019-01-20 MED ORDER — METOPROLOL TARTRATE 25 MG PO TABS: ORAL_TABLET | ORAL | 1 refills | Status: DC

## 2019-01-20 MED ORDER — ISOSORBIDE MONONITRATE ER 30 MG PO TB24: 30.0000 mg | ORAL_TABLET | Freq: Every day | ORAL | 1 refills | Status: DC

## 2019-01-20 MED ORDER — TRIAMCINOLONE ACETONIDE 0.1 % EX CREA: 1.0000 "application " | TOPICAL_CREAM | Freq: Two times a day (BID) | CUTANEOUS | 1 refills | Status: DC

## 2019-01-20 MED ORDER — LOSARTAN POTASSIUM 25 MG PO TABS: 25.0000 mg | ORAL_TABLET | Freq: Every day | ORAL | 1 refills | Status: DC

## 2019-01-20 MED ORDER — PAROXETINE HCL 20 MG PO TABS: 40.0000 mg | ORAL_TABLET | Freq: Every day | ORAL | 1 refills | Status: DC

## 2019-01-20 MED ORDER — FENOFIBRATE 145 MG PO TABS: 145.0000 mg | ORAL_TABLET | Freq: Every day | ORAL | 1 refills | Status: DC

## 2019-01-20 NOTE — Assessment & Plan Note (Signed)
Will keep BP, sugars and cholesterol under good control. Continue current regimen. Continue to monitor. Call with any concerns.  

## 2019-01-20 NOTE — Assessment & Plan Note (Signed)
Under good control on current regimen. Continue current regimen. Continue to monitor. Call with any concerns. Refills given.   

## 2019-01-20 NOTE — Assessment & Plan Note (Signed)
Under good control on current regimen. Continue current regimen. Continue to monitor. Call with any concerns. Refills given. Labs drawn today.   

## 2019-01-20 NOTE — Assessment & Plan Note (Signed)
Under fair control on current regimen with A1c of 7.2- continue current regimen. Recheck 3 months. Call with any concerns.

## 2019-01-20 NOTE — Progress Notes (Signed)
BP (!) 157/62   Pulse 98   Temp 98.8 F (37.1 C)   Ht 5\' 3"  (1.6 m)   Wt 193 lb 8 oz (87.8 kg)   SpO2 98%   BMI 34.28 kg/m    Subjective:    Patient ID: Susan Allen, female    DOB: 16-Sep-1940, 78 y.o.   MRN: [id number]  HPI: Susan Allen is a 78 y.o. female presenting on 01/20/2019 for comprehensive medical examination. Current medical complaints include:  HYPERTENSION / HYPERLIPIDEMIA Satisfied with current treatment? yes Duration of hypertension: chronic BP monitoring frequency: not checking BP medication side effects: no Past BP meds: imdur, metoprolol, losartan Duration of hyperlipidemia: chronic Cholesterol medication side effects: no Cholesterol supplements: none Past cholesterol medications: pravastatin, fenofibrate Medication compliance: excellent compliance Aspirin: no Recent stressors: no Recurrent headaches: no Visual changes: no Palpitations: no Dyspnea: no Chest pain: yes Lower extremity edema: yes- occasionally Dizzy/lightheaded: no  DIABETES Hypoglycemic episodes:no Polydipsia/polyuria: no Visual disturbance: no Chest pain: yes Paresthesias: no Glucose Monitoring: yes  Accucheck frequency: occasionally  Fasting glucose: 158 this AM, usually in the 130s Taking Insulin?: no Blood Pressure Monitoring: not checking Retinal Examination: Not up to Date Foot Exam: Done today Diabetic Education: Completed Pneumovax: Up to Date Influenza: will get in october Aspirin: no  DEPRESSION Mood status: controlled Satisfied with current treatment?: yes Symptom severity: mild  Duration of current treatment : chronic Side effects: no Medication compliance: excellent compliance Psychotherapy/counseling: no  Previous psychiatric medications: paxil Depressed mood: no Anxious mood: no Anhedonia: no Significant weight loss or gain: no Insomnia: no  Fatigue: no Feelings of worthlessness or guilt: no Impaired concentration/indecisiveness: no Suicidal  ideations: no Hopelessness: no Crying spells: no Depression screen [facility] 2/9 01/20/2019 06/01/2018  Decreased Interest 0 3  Down, Depressed, Hopeless 0 3  PHQ - 2 Score 0 6  Altered sleeping 0 3  Tired, decreased energy 0 3  Change in appetite 0 0  Feeling bad or failure about yourself  0 1  Trouble concentrating 0 0  Moving slowly or fidgety/restless 0 0  Suicidal thoughts 0 0  PHQ-9 Score 0 13  Difficult doing work/chores Not difficult at all Somewhat difficult   She currently lives with: husband Menopausal Symptoms: no  Functional Status Survey: Is the patient deaf or have difficulty hearing?: No Does the patient have difficulty seeing, even when wearing glasses/contacts?: No Does the patient have difficulty concentrating, remembering, or making decisions?: Yes Does the patient have difficulty walking or climbing stairs?: No Does the patient have difficulty dressing or bathing?: No Does the patient have difficulty doing errands alone such as visiting a doctor's office or shopping?: No  Fall Risk  01/20/2019 06/01/2018  Falls in the past year? 0 0  Number falls in past yr: 0 0  Injury with Fall? 0 0    Depression Screen Depression screen [facility] 2/9 01/20/2019 06/01/2018  Decreased Interest 0 3  Down, Depressed, Hopeless 0 3  PHQ - 2 Score 0 6  Altered sleeping 0 3  Tired, decreased energy 0 3  Change in appetite 0 0  Feeling bad or failure about yourself  0 1  Trouble concentrating 0 0  Moving slowly or fidgety/restless 0 0  Suicidal thoughts 0 0  PHQ-9 Score 0 13  Difficult doing work/chores Not difficult at all Somewhat difficult   Advanced Directives Does patient have a HCPOA?    no If yes, name and contact information:  Does patient have a living will  or MOST form?  no  Past Medical History:  Past Medical History:  Diagnosis Date  . Allergy   . Arthritis   . Bronchitis   . Coronary artery disease   . Depression   . Diabetes mellitus without complication ([place])    . GERD (gastroesophageal reflux disease)   . Headache   . Hypertension   . Myocardial infarction [facility]) June 2014, July 2014    Surgical History:  Past Surgical History:  Procedure Laterality Date  . ABDOMINAL HYSTERECTOMY    . APPENDECTOMY    . CORONARY ANGIOPLASTY     with stents   . DIGIT NAIL REMOVAL Bilateral 08/30/2015   Procedure: REMOVAL OF DIGIT NAILS/ BIL. PERM.REMOVAL INGROWN GREAT TOE NAILS;  Surgeon: Albertine Patricia, DPM;  Location: [facility] ORS;  Service: Podiatry;  Laterality: Bilateral;  . 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 JOINT REPLACEMENT Bilateral    Total Knee Replacement, Dr Burnis Kingfisher, [place]  . TONSILLECTOMY      Medications:  Current Outpatient Medications on File Prior to Visit  Medication Sig  . alum & mag hydroxide-simeth (MYLANTA) 200-200-20 MG/5ML suspension Take 15 mLs by mouth every 6 (six) hours as needed for indigestion or heartburn.  . cetirizine (ZYRTEC) 10 MG tablet Take 10 mg by mouth as needed for allergies.  Marland Kitchen clopidogrel (PLAVIX) 75 MG tablet Take 75 mg by mouth daily.  . nitroGLYCERIN (NITROSTAT) 0.4 MG SL tablet Place 0.4 mg under the tongue every 5 (five) minutes as needed for chest pain.  . Simethicone (GAS-X PO) Take by mouth.   No current facility-administered medications on file prior to visit.     Allergies:  Allergies  Allergen Reactions  . Aspirin Hives and Other (See Comments)    "fainting"  . Lisinopril Cough    Social History:  Social History   Socioeconomic History  . Marital status: Married    Spouse name: Not on file  . Number of children: Not on file  . Years of education: Not on file  . Highest education level: Not on file  Occupational History  . Not on file  Social Needs  . Financial resource strain: Not on file  . Food insecurity    Worry: Not  on file    Inability: Not on file  . Transportation needs    Medical: Not on file    Non-medical: Not on file  Tobacco Use  . Smoking status: Former Smoker    Packs/day: 0.25    Types: Cigarettes    Quit date: 02/27/2004    Years since quitting: 14.9  . Smokeless tobacco: Never Used  Substance and Sexual Activity  . Alcohol use: No  . Drug use: No  . Sexual activity: Not Currently  Lifestyle  . Physical activity    Days per week: Not on file    Minutes per session: Not on file  . Stress: Not on file  Relationships  . Social Herbalist on phone: Not on file    Gets together: Not on file    Attends religious service: Not on file    Active member of club or organization: Not on file    Attends meetings of clubs or organizations: Not on file    Relationship status: Not on file  . Intimate partner violence    Fear of current or  ex partner: Not on file    Emotionally abused: Not on file    Physically abused: Not on file    Forced sexual activity: Not on file  Other Topics Concern  . Not on file  Social History Narrative  . Not on file   Social History   Tobacco Use  Smoking Status Former Smoker  . Packs/day: 0.25  . Types: Cigarettes  . Quit date: 02/27/2004  . Years since quitting: 14.9  Smokeless Tobacco Never Used   Social History   Substance and Sexual Activity  Alcohol Use No    Family History:  Family History  Problem Relation Age of Onset  . Colon cancer Mother 41  . Cancer Mother        colon  . Cancer Father        lung  . Cancer Sister        ovarian  . Breast cancer Neg Hx     Past medical history, surgical history, medications, allergies, family history and social history reviewed with patient today and changes made to appropriate areas of the chart.   Review of Systems  Constitutional: Negative.   HENT: Negative.   Eyes: Negative.   Respiratory: Negative.   Cardiovascular: Negative.   Gastrointestinal: Negative.    Genitourinary: Negative.   Musculoskeletal: Negative.   Skin: Negative.   Neurological: Negative.   Endo/Heme/Allergies: Negative.   Psychiatric/Behavioral: Negative.     All other ROS negative except what is listed above and in the HPI.      Objective:    BP (!) 157/62   Pulse 98   Temp 98.8 F (37.1 C)   Ht 5\' 3"  (1.6 m)   Wt 193 lb 8 oz (87.8 kg)   SpO2 98%   BMI 34.28 kg/m   Wt Readings from Last 3 Encounters:  01/20/19 193 lb 8 oz (87.8 kg)  06/01/18 175 lb 9 oz (79.6 kg)  02/07/18 178 lb (80.7 kg)    Physical Exam Vitals signs and nursing note reviewed.  Constitutional:      General: She is not in acute distress.    Appearance: Normal appearance. She is not ill-appearing, toxic-appearing or diaphoretic.  HENT:     Head: Normocephalic and atraumatic.     Right Ear: Tympanic membrane, ear canal and external ear normal. There is no impacted cerumen.     Left Ear: Tympanic membrane, ear canal and external ear normal. There is no impacted cerumen.     Nose: Nose normal. No congestion or rhinorrhea.     Mouth/Throat:     Mouth: Mucous membranes are moist.     Pharynx: Oropharynx is clear. No oropharyngeal exudate or posterior oropharyngeal erythema.  Eyes:     General: No scleral icterus.       Right eye: No discharge.        Left eye: No discharge.     Extraocular Movements: Extraocular movements intact.     Conjunctiva/sclera: Conjunctivae normal.     Pupils: Pupils are equal, round, and reactive to light.  Neck:     Musculoskeletal: Normal range of motion and neck supple. No neck rigidity or muscular tenderness.     Vascular: No carotid bruit.  Cardiovascular:     Rate and Rhythm: Normal rate and regular rhythm.     Pulses: Normal pulses.     Heart sounds: No murmur. No friction rub. No gallop.   Pulmonary:     Effort: Pulmonary effort is normal. No respiratory  distress.     Breath sounds: Normal breath sounds. No stridor. No wheezing, rhonchi or rales.   Chest:     Chest wall: No tenderness.  Abdominal:     General: Abdomen is flat. Bowel sounds are normal. There is no distension.     Palpations: Abdomen is soft. There is no mass.     Tenderness: There is no abdominal tenderness. There is no right CVA tenderness, left CVA tenderness, guarding or rebound.     Hernia: No hernia is present.  Genitourinary:    Comments: Breast and pelvic exams deferred with shared decision making Musculoskeletal:        General: No swelling, tenderness, deformity or signs of injury.     Right lower leg: No edema.     Left lower leg: No edema.  Lymphadenopathy:     Cervical: No cervical adenopathy.  Skin:    General: Skin is warm and dry.     Capillary Refill: Capillary refill takes less than 2 seconds.     Coloration: Skin is not jaundiced or pale.     Findings: No bruising, erythema, lesion or rash.  Neurological:     General: No focal deficit present.     Mental Status: She is alert and oriented to person, place, and time. Mental status is at baseline.     Cranial Nerves: No cranial nerve deficit.     Sensory: No sensory deficit.     Motor: No weakness.     Coordination: Coordination normal.     Gait: Gait normal.     Deep Tendon Reflexes: Reflexes normal.  Psychiatric:        Mood and Affect: Mood normal.        Behavior: Behavior normal.        Thought Content: Thought content normal.        Judgment: Judgment normal.     6CIT Screen 01/20/2019  What Year? 0 points  What month? 0 points  What time? 0 points  Count back from 20 0 points  Months in reverse 0 points  Repeat phrase 2 points  Total Score 2     Results for orders placed or performed in visit on 0000000  Basic metabolic panel  Result Value Ref Range   Glucose 144    BUN 14 4 - 21   Creatinine 0.7 0.5 - 1.1   Potassium 4.4 3.4 - 5.3   Sodium 143 137 - 147  Lipid panel  Result Value Ref Range   Triglycerides 149 40 - 160   Cholesterol 147 0 - 200   HDL 39 35 - 70    LDL Cholesterol 79   Hepatic function panel  Result Value Ref Range   Alkaline Phosphatase 41 25 - 125   ALT 8 7 - 35   AST 13 13 - 35   Bilirubin, Total 0.4   Hemoglobin A1c  Result Value Ref Range   Hemoglobin A1C 7.1   HM DIABETES EYE EXAM  Result Value Ref Range   HM Diabetic Eye Exam No Retinopathy No Retinopathy  HM COLONOSCOPY  Result Value Ref Range   HM Colonoscopy See Report (in chart) See Report (in chart), Patient Reported      Assessment & Plan:   Problem List Items Addressed This Visit      Cardiovascular and Mediastinum   Essential hypertension    Slightly elevated. Work on [employer] and monitor at home. Recheck 3 months. Call with any concerns. Refills given.  Labs checked today.      Relevant Medications   pravastatin (PRAVACHOL) 40 MG tablet   metoprolol tartrate (LOPRESSOR) 25 MG tablet   losartan (COZAAR) 25 MG tablet   fenofibrate (TRICOR) 145 MG tablet   isosorbide mononitrate (IMDUR) 30 MG 24 hr tablet   Other Relevant Orders   CBC with Differential/Platelet   Comprehensive metabolic panel   Microalbumin, Urine Waived   TSH   UA/M w/rflx Culture, Routine   CAD (coronary artery disease)    Will keep BP, sugars and cholesterol under good control. Continue current regimen. Continue to monitor. Call with any concerns.       Relevant Medications   pravastatin (PRAVACHOL) 40 MG tablet   metoprolol tartrate (LOPRESSOR) 25 MG tablet   losartan (COZAAR) 25 MG tablet   fenofibrate (TRICOR) 145 MG tablet   isosorbide mononitrate (IMDUR) 30 MG 24 hr tablet     Endocrine   Diabetes (HCC)    Under fair control on current regimen with A1c of 7.2- continue current regimen. Recheck 3 months. Call with any concerns.       Relevant Medications   pravastatin (PRAVACHOL) 40 MG tablet   metFORMIN (GLUCOPHAGE) 500 MG tablet   losartan (COZAAR) 25 MG tablet   Other Relevant Orders   Bayer DCA Hb A1c Waived   CBC with Differential/Platelet   Comprehensive  metabolic panel   Microalbumin, Urine Waived   TSH   UA/M w/rflx Culture, Routine     Other   Hyperlipidemia    Under good control on current regimen. Continue current regimen. Continue to monitor. Call with any concerns. Refills given. Labs drawn today.       Relevant Medications   pravastatin (PRAVACHOL) 40 MG tablet   metoprolol tartrate (LOPRESSOR) 25 MG tablet   losartan (COZAAR) 25 MG tablet   fenofibrate (TRICOR) 145 MG tablet   isosorbide mononitrate (IMDUR) 30 MG 24 hr tablet   Other Relevant Orders   CBC with Differential/Platelet   Comprehensive metabolic panel   Lipid Panel w/o Chol/HDL Ratio   TSH   UA/M w/rflx Culture, Routine   Depression, recurrent (HCC)    Under good control on current regimen. Continue current regimen. Continue to monitor. Call with any concerns. Refills given.        Relevant Medications   traZODone (DESYREL) 50 MG tablet   PARoxetine (PAXIL) 20 MG tablet   Other Relevant Orders   CBC with Differential/Platelet   TSH   UA/M w/rflx Culture, Routine    Other Visit Diagnoses    Encounter for Medicare annual wellness exam    -  Primary   Preventative care discussed today as below.    Routine general medical examination at a health care facility       Vaccines up to date. Screening labs checked today. Pap, mammo N/A. Colonoscopy due this year. DEXA UTD. Call with any concerns. Continue diet and exercise.        Preventative Services:  Health Risk Assessment and Personalized Prevention Plan: Done today Bone Mass Measurements: Up to date Breast Cancer Screening: N/A CVD Screening: Done today Cervical Cancer Screening: N/A Colon Cancer Screening: Will get this year Depression Screening: Done today Diabetes Screening: Done today Glaucoma Screening: See your eye doctor Hepatitis B vaccine: N/A Hepatitis C screening: Up to Date HIV Screening: Up to date Flu Vaccine: Will get in October Lung cancer Screening: N/A Obesity Screening:  Done today Pneumonia Vaccines (2): Up to date STI Screening: N/A  Follow  up plan: Return in about 3 months (around 04/22/2019) for DM visit.   LABORATORY TESTING:  - Pap smear: not applicable  IMMUNIZATIONS:   - Tdap: Tetanus vaccination status reviewed: last tetanus booster within 10 years. - Influenza: Will get in October - Pneumovax: Up to date - Prevnar: Up to date  SCREENING: -Mammogram: Not applicable  - Colonoscopy: Up to date  - Bone Density: Up to date   PATIENT COUNSELING:   Advised to take 1 mg of folate supplement per day if capable of pregnancy.   Sexuality: Discussed sexually transmitted diseases, partner selection, use of condoms, avoidance of unintended pregnancy  and contraceptive alternatives.   Advised to avoid cigarette smoking.  I discussed with the patient that most people either abstain from alcohol or drink within safe limits (<=14/week and <=4 drinks/occasion for males, <=7/weeks and <= 3 drinks/occasion for females) and that the risk for alcohol disorders and other health effects rises proportionally with the number of drinks per week and how often a drinker exceeds daily limits.  Discussed cessation/primary prevention of drug use and availability of treatment for abuse.   Diet: Encouraged to adjust caloric intake to maintain  or achieve ideal body weight, to reduce intake of dietary saturated fat and total fat, to limit sodium intake by avoiding high sodium foods and not adding table salt, and to maintain adequate dietary potassium and calcium preferably from fresh fruits, vegetables, and low-fat dairy products.    stressed the importance of regular exercise  Injury prevention: Discussed safety belts, safety helmets, smoke detector, smoking near bedding or upholstery.   Dental health: Discussed importance of regular tooth brushing, flossing, and dental visits.    NEXT PREVENTATIVE PHYSICAL DUE IN 1 YEAR. Return in about 3 months (around  04/22/2019) for DM visit.

## 2019-01-20 NOTE — Patient Instructions (Addendum)
Preventative Services:  Health Risk Assessment and Personalized Prevention Plan: Done today Bone Mass Measurements: Up to date Breast Cancer Screening: N/A CVD Screening: Done today Cervical Cancer Screening: N/A Colon Cancer Screening: Will get this year Depression Screening: Done today Diabetes Screening: Done today Glaucoma Screening: See your eye doctor Hepatitis B vaccine: N/A Hepatitis C screening: Up to Date HIV Screening: Up to date Flu Vaccine: Will get in October Lung cancer Screening: N/A Obesity Screening: Done today Pneumonia Vaccines (2): Up to date STI Screening: N/A  Health Maintenance After Age 3 After age 38, you are at a higher risk for certain long-term diseases and infections as well as injuries from falls. Falls are a major cause of broken bones and head injuries in people who are older than age 12. Getting regular preventive care can help to keep you healthy and well. Preventive care includes getting regular testing and making lifestyle changes as recommended by your health care provider. Talk with your health care provider about:  Which screenings and tests you should have. A screening is a test that checks for a disease when you have no symptoms.  A diet and exercise plan that is right for you. What should I know about screenings and tests to prevent falls? Screening and testing are the best ways to find a health problem early. Early diagnosis and treatment give you the best chance of managing medical conditions that are common after age 69. Certain conditions and lifestyle choices may make you more likely to have a fall. Your health care provider may recommend:  Regular vision checks. Poor vision and conditions such as cataracts can make you more likely to have a fall. If you wear glasses, make sure to get your prescription updated if your vision changes.  Medicine review. Work with your health care provider to regularly review all of the medicines you are  taking, including over-the-counter medicines. Ask your health care provider about any side effects that may make you more likely to have a fall. Tell your health care provider if any medicines that you take make you feel dizzy or sleepy.  Osteoporosis screening. Osteoporosis is a condition that causes the bones to get weaker. This can make the bones weak and cause them to break more easily.  Blood pressure screening. Blood pressure changes and medicines to control blood pressure can make you feel dizzy.  Strength and balance checks. Your health care provider may recommend certain tests to check your strength and balance while standing, walking, or changing positions.  Foot health exam. Foot pain and numbness, as well as not wearing proper footwear, can make you more likely to have a fall.  Depression screening. You may be more likely to have a fall if you have a fear of falling, feel emotionally low, or feel unable to do activities that you used to do.  Alcohol use screening. Using too much alcohol can affect your balance and may make you more likely to have a fall. What actions can I take to lower my risk of falls? General instructions  Talk with your health care provider about your risks for falling. Tell your health care provider if: ? You fall. Be sure to tell your health care provider about all falls, even ones that seem minor. ? You feel dizzy, sleepy, or off-balance.  Take over-the-counter and prescription medicines only as told by your health care provider. These include any supplements.  Eat a healthy diet and maintain a healthy weight. A healthy diet includes  low-fat dairy products, low-fat (lean) meats, and fiber from whole grains, beans, and lots of fruits and vegetables. Home safety  Remove any tripping hazards, such as rugs, cords, and clutter.  Install safety equipment such as grab bars in bathrooms and safety rails on stairs.  Keep rooms and walkways well-lit. Activity    Follow a regular exercise program to stay fit. This will help you maintain your balance. Ask your health care provider what types of exercise are appropriate for you.  If you need a cane or walker, use it as recommended by your health care provider.  Wear supportive shoes that have nonskid soles. Lifestyle  Do not drink alcohol if your health care provider tells you not to drink.  If you drink alcohol, limit how much you have: ? 0-1 drink a day for women. ? 0-2 drinks a day for men.  Be aware of how much alcohol is in your drink. In the [place] one drink equals one typical bottle of beer (12 oz), one-half glass of wine (5 oz), or one shot of hard liquor (1 oz).  Do not use any products that contain nicotine or tobacco, such as cigarettes and e-cigarettes. If you need help quitting, ask your health care provider. Summary  Having a healthy lifestyle and getting preventive care can help to protect your health and wellness after age 53.  Screening and testing are the best way to find a health problem early and help you avoid having a fall. Early diagnosis and treatment give you the best chance for managing medical conditions that are more common for people who are older than age 72.  Falls are a major cause of broken bones and head injuries in people who are older than age 71. Take precautions to prevent a fall at home.  Work with your health care provider to learn what changes you can make to improve your health and wellness and to prevent falls. This information is not intended to replace advice given to you by your health care provider. Make sure you discuss any questions you have with your health care provider. Document Released: 03/24/2017 Document Revised: 09/01/2018 Document Reviewed: 03/24/2017 Elsevier Patient Education  2020 [employer].

## 2019-01-20 NOTE — Assessment & Plan Note (Signed)
Slightly elevated. Work on [employer] and monitor at home. Recheck 3 months. Call with any concerns. Refills given. Labs checked today.

## 2019-01-21 LAB — CBC WITH DIFFERENTIAL/PLATELET
Basophils Absolute: 0.1 10*3/uL (ref 0.0–0.2)
Basos: 1 %
EOS (ABSOLUTE): 0.2 10*3/uL (ref 0.0–0.4)
Eos: 5 %
Hematocrit: 40.4 % (ref 34.0–46.6)
Hemoglobin: 13.1 g/dL (ref 11.1–15.9)
Immature Grans (Abs): 0 10*3/uL (ref 0.0–0.1)
Immature Granulocytes: 0 %
Lymphocytes Absolute: 1.6 10*3/uL (ref 0.7–3.1)
Lymphs: 38 %
MCH: 27.6 pg (ref 26.6–33.0)
MCHC: 32.4 g/dL (ref 31.5–35.7)
MCV: 85 fL (ref 79–97)
Monocytes Absolute: 0.4 10*3/uL (ref 0.1–0.9)
Monocytes: 10 %
Neutrophils Absolute: 1.9 10*3/uL (ref 1.4–7.0)
Neutrophils: 46 %
RBC: 4.74 x10E6/uL (ref 3.77–5.28)
RDW: 12.9 % (ref 11.7–15.4)
WBC: 4.3 10*3/uL (ref 3.4–10.8)

## 2019-01-21 LAB — COMPREHENSIVE METABOLIC PANEL
ALT: 12 IU/L (ref 0–32)
AST: 19 IU/L (ref 0–40)
Albumin/Globulin Ratio: 1.7 (ref 1.2–2.2)
Albumin: 4.1 g/dL (ref 3.7–4.7)
Alkaline Phosphatase: 33 IU/L — ABNORMAL LOW (ref 39–117)
BUN/Creatinine Ratio: 24 (ref 12–28)
BUN: 22 mg/dL (ref 8–27)
Bilirubin Total: 0.3 mg/dL (ref 0.0–1.2)
CO2: 28 mmol/L (ref 20–29)
Calcium: 9.6 mg/dL (ref 8.7–10.3)
Chloride: 101 mmol/L (ref 96–106)
Creatinine, Ser: 0.93 mg/dL (ref 0.57–1.00)
GFR calc Af Amer: 69 mL/min/{1.73_m2} (ref >59)
GFR calc non Af Amer: 59 mL/min/{1.73_m2} — ABNORMAL LOW (ref >59)
Globulin, Total: 2.4 g/dL (ref 1.5–4.5)
Glucose: 162 mg/dL — ABNORMAL HIGH (ref 65–99)
Potassium: 4.6 mmol/L (ref 3.5–5.2)
Sodium: 143 mmol/L (ref 134–144)
Total Protein: 6.5 g/dL (ref 6.0–8.5)

## 2019-01-21 LAB — LIPID PANEL W/O CHOL/HDL RATIO
Cholesterol, Total: 170 mg/dL (ref 100–199)
HDL: 52 mg/dL (ref >39)
LDL Calculated: 94 mg/dL (ref 0–99)
Triglycerides: 118 mg/dL (ref 0–149)
VLDL Cholesterol Cal: 24 mg/dL (ref 5–40)

## 2019-01-21 LAB — TSH: TSH: 3.38 u[IU]/mL (ref 0.450–4.500)

## 2019-01-23 ENCOUNTER — Encounter: Payer: Self-pay | Admitting: Family Medicine

## 2019-02-09 ENCOUNTER — Encounter (INDEPENDENT_AMBULATORY_CARE_PROVIDER_SITE_OTHER): Payer: PPO

## 2019-02-09 ENCOUNTER — Ambulatory Visit (INDEPENDENT_AMBULATORY_CARE_PROVIDER_SITE_OTHER): Payer: PPO | Admitting: Vascular Surgery

## 2019-02-22 ENCOUNTER — Ambulatory Visit
Admission: RE | Admit: 2019-02-22 | Discharge: 2019-02-22 | Disposition: A | Discharge status: Home / Self Care | Payer: PPO | Source: Ambulatory Visit | Attending: Family Medicine | Admitting: Family Medicine

## 2019-02-22 ENCOUNTER — Encounter: Payer: Self-pay | Admitting: Family Medicine

## 2019-02-22 DIAGNOSIS — Z1231 Encounter for screening mammogram for malignant neoplasm of breast: Secondary | ICD-10-CM | POA: Diagnosis not present

## 2019-03-23 DIAGNOSIS — E782 Mixed hyperlipidemia: Secondary | ICD-10-CM | POA: Diagnosis not present

## 2019-03-23 DIAGNOSIS — I6522 Occlusion and stenosis of left carotid artery: Secondary | ICD-10-CM | POA: Diagnosis not present

## 2019-03-23 DIAGNOSIS — Z955 Presence of coronary angioplasty implant and graft: Secondary | ICD-10-CM | POA: Diagnosis not present

## 2019-03-23 DIAGNOSIS — I10 Essential (primary) hypertension: Secondary | ICD-10-CM | POA: Diagnosis not present

## 2019-03-23 DIAGNOSIS — I2510 Atherosclerotic heart disease of native coronary artery without angina pectoris: Secondary | ICD-10-CM | POA: Diagnosis not present

## 2019-03-23 DIAGNOSIS — I6529 Occlusion and stenosis of unspecified carotid artery: Secondary | ICD-10-CM | POA: Diagnosis not present

## 2019-03-23 DIAGNOSIS — E118 Type 2 diabetes mellitus with unspecified complications: Secondary | ICD-10-CM | POA: Diagnosis not present

## 2019-03-26 ENCOUNTER — Other Ambulatory Visit: Payer: Self-pay | Admitting: Family Medicine

## 2019-04-25 ENCOUNTER — Encounter: Payer: Self-pay | Admitting: Family Medicine

## 2019-04-25 ENCOUNTER — Ambulatory Visit: Payer: PPO | Admitting: Family Medicine

## 2019-04-25 ENCOUNTER — Ambulatory Visit (INDEPENDENT_AMBULATORY_CARE_PROVIDER_SITE_OTHER): Payer: PPO | Admitting: Family Medicine

## 2019-04-25 ENCOUNTER — Other Ambulatory Visit: Payer: Self-pay

## 2019-04-25 VITALS — BP 178/42

## 2019-04-25 DIAGNOSIS — E1159 Type 2 diabetes mellitus with other circulatory complications: Secondary | ICD-10-CM | POA: Diagnosis not present

## 2019-04-25 DIAGNOSIS — Z23 Encounter for immunization: Secondary | ICD-10-CM

## 2019-04-25 DIAGNOSIS — I10 Essential (primary) hypertension: Secondary | ICD-10-CM

## 2019-04-25 DIAGNOSIS — E782 Mixed hyperlipidemia: Secondary | ICD-10-CM

## 2019-04-25 MED ORDER — LOSARTAN POTASSIUM 50 MG PO TABS: 50.0000 mg | ORAL_TABLET | Freq: Every day | ORAL | 1 refills | Status: DC

## 2019-04-25 MED ORDER — OMEPRAZOLE 20 MG PO CPDR: 20.0000 mg | DELAYED_RELEASE_CAPSULE | Freq: Every day | ORAL | 3 refills | Status: DC

## 2019-04-25 NOTE — Assessment & Plan Note (Signed)
Will get her in for labs ASAP. Treat as needed. Call with any concerns.

## 2019-04-25 NOTE — Progress Notes (Signed)
BP (!) 178/42    Subjective:    Patient ID: Susan Allen, female    DOB: [birth year], 78 y.o.   MRN: [id number]  HPI: Susan Allen is a 78 y.o. female  Chief Complaint  Patient presents with  . Follow-up  . Gastroesophageal Reflux    Was previously on Zantac, would like to try omeprazole   HYPERTENSION Hypertension status: uncontrolled  Satisfied with current treatment? no Duration of hypertension: chronic BP monitoring frequency:  a few times a month BP medication side effects:  no Medication compliance: excellent compliance Previous BP meds: losartan, imdur, metoprolol Aspirin: no Recurrent headaches: no Visual changes: no Palpitations: no Dyspnea: no Chest pain: no Lower extremity edema: no Dizzy/lightheaded: no  DIABETES Hypoglycemic episodes:no Polydipsia/polyuria: no Visual disturbance: no Chest pain: no Paresthesias: no Glucose Monitoring: no  Accucheck frequency: Not Checking Taking Insulin?: no Retinal Examination: Not up to Date Foot Exam: Up to Date Diabetic Education: Completed Pneumovax: Up to Date Influenza: Not up to Date Aspirin: no  GERD GERD control status: exacerbated  Satisfied with current treatment? no Heartburn frequency: for about the past week Medication side effects: no  Medication compliance: excellent Dysphagia: no Odynophagia:  no Hematemesis: no Blood in stool: no EGD: no   Relevant past medical, surgical, family and social history reviewed and updated as indicated. Interim medical history since our last visit reviewed. Allergies and medications reviewed and updated.  Review of Systems  Constitutional: Negative.   Respiratory: Negative.   Cardiovascular: Negative.   Gastrointestinal: Negative.        + heartburn  Neurological: Negative.   Psychiatric/Behavioral: Negative.     Per HPI unless specifically indicated above     Objective:    BP (!) 178/42   Wt Readings from Last 3 Encounters:  01/20/19 193 lb 8  oz (87.8 kg)  06/01/18 175 lb 9 oz (79.6 kg)  02/07/18 178 lb (80.7 kg)    Physical Exam Vitals signs and nursing note reviewed.  Constitutional:      General: She is not in acute distress.    Appearance: Normal appearance. She is not ill-appearing, toxic-appearing or diaphoretic.  HENT:     Head: Normocephalic and atraumatic.     Right Ear: External ear normal.     Left Ear: External ear normal.     Nose: Nose normal.     Mouth/Throat:     Mouth: Mucous membranes are moist.     Pharynx: Oropharynx is clear.  Eyes:     General: No scleral icterus.       Right eye: No discharge.        Left eye: No discharge.     Conjunctiva/sclera: Conjunctivae normal.     Pupils: Pupils are equal, round, and reactive to light.  Neck:     Musculoskeletal: Normal range of motion.  Pulmonary:     Effort: Pulmonary effort is normal. No respiratory distress.     Comments: Speaking in full sentences Musculoskeletal: Normal range of motion.  Skin:    Coloration: Skin is not jaundiced or pale.     Findings: No bruising, erythema, lesion or rash.  Neurological:     Mental Status: She is alert and oriented to person, place, and time. Mental status is at baseline.  Psychiatric:        Mood and Affect: Mood normal.        Behavior: Behavior normal.        Thought Content: Thought content normal.  Judgment: Judgment normal.     Results for orders placed or performed in visit on 01/20/19  Bayer DCA Hb A1c Waived  Result Value Ref Range   HB A1C (BAYER DCA - WAIVED) 7.2 (H) <7.0 %  CBC with Differential/Platelet  Result Value Ref Range   WBC 4.3 3.4 - 10.8 x10E3/uL   RBC 4.74 3.77 - 5.28 x10E6/uL   Hemoglobin 13.1 11.1 - 15.9 g/dL   Hematocrit 40.4 34.0 - 46.6 %   MCV 85 79 - 97 fL   MCH 27.6 26.6 - 33.0 pg   MCHC 32.4 31.5 - 35.7 g/dL   RDW 12.9 11.7 - 15.4 %   Platelets CANCELED x10E3/uL   Neutrophils 46 Not Estab. %   Lymphs 38 Not Estab. %   Monocytes 10 Not Estab. %   Eos 5  Not Estab. %   Basos 1 Not Estab. %   Neutrophils Absolute 1.9 1.4 - 7.0 x10E3/uL   Lymphocytes Absolute 1.6 0.7 - 3.1 x10E3/uL   Monocytes Absolute 0.4 0.1 - 0.9 x10E3/uL   EOS (ABSOLUTE) 0.2 0.0 - 0.4 x10E3/uL   Basophils Absolute 0.1 0.0 - 0.2 x10E3/uL   Immature Granulocytes 0 Not Estab. %   Immature Grans (Abs) 0.0 0.0 - 0.1 x10E3/uL   Hematology Comments: Note:   Comprehensive metabolic panel  Result Value Ref Range   Glucose 162 (H) 65 - 99 mg/dL   BUN 22 8 - 27 mg/dL   Creatinine, Ser 0.93 0.57 - 1.00 mg/dL   GFR calc non Af Amer 59 (L) >59 mL/min/1.73   GFR calc Af Amer 69 >59 mL/min/1.73   BUN/Creatinine Ratio 24 12 - 28   Sodium 143 134 - 144 mmol/L   Potassium 4.6 3.5 - 5.2 mmol/L   Chloride 101 96 - 106 mmol/L   CO2 28 20 - 29 mmol/L   Calcium 9.6 8.7 - 10.3 mg/dL   Total Protein 6.5 6.0 - 8.5 g/dL   Albumin 4.1 3.7 - 4.7 g/dL   Globulin, Total 2.4 1.5 - 4.5 g/dL   Albumin/Globulin Ratio 1.7 1.2 - 2.2   Bilirubin Total 0.3 0.0 - 1.2 mg/dL   Alkaline Phosphatase 33 (L) 39 - 117 IU/L   AST 19 0 - 40 IU/L   ALT 12 0 - 32 IU/L  Lipid Panel w/o Chol/HDL Ratio  Result Value Ref Range   Cholesterol, Total 170 100 - 199 mg/dL   Triglycerides 118 0 - 149 mg/dL   HDL 52 >39 mg/dL   VLDL Cholesterol Cal 24 5 - 40 mg/dL   LDL Calculated 94 0 - 99 mg/dL  Microalbumin, Urine Waived  Result Value Ref Range   Microalb, Ur Waived 10 0 - 19 mg/L   Creatinine, Urine Waived 100 10 - 300 mg/dL   Microalb/Creat Ratio <30 <30 mg/g  TSH  Result Value Ref Range   TSH 3.380 0.450 - 4.500 uIU/mL  UA/M w/rflx Culture, Routine   Specimen: Blood   BLD  Result Value Ref Range   Specific Gravity, UA 1.025 1.005 - 1.030   pH, UA 6.5 5.0 - 7.5   Color, UA Yellow Yellow   Appearance Ur Clear Clear   Leukocytes,UA Negative Negative   Protein,UA Negative Negative/Trace   Glucose, UA Negative Negative   Ketones, UA Negative Negative   RBC, UA Negative Negative   Bilirubin, UA  Negative Negative   Urobilinogen, Ur 1.0 0.2 - 1.0 mg/dL   Nitrite, UA Negative Negative  Assessment & Plan:   Problem List Items Addressed This Visit      Cardiovascular and Mediastinum   Essential hypertension    Not under food control. Will increase her losartan to 33m and recheck in 1-2 months.       Relevant Medications   losartan (COZAAR) 50 MG tablet   Other Relevant Orders   Comp Met (CMET)     Endocrine   Diabetes ([place]    Will get her in for labs ASAP. Treat as needed. Call with any concerns. Refills given.       Relevant Medications   losartan (COZAAR) 50 MG tablet   Other Relevant Orders   Bayer DCA Hb A1c Waived   Comp Met (CMET)     Other   Hyperlipidemia    Will get her in for labs ASAP. Treat as needed. Call with any concerns.       Relevant Medications   losartan (COZAAR) 50 MG tablet    Other Visit Diagnoses    Immunization due       Relevant Orders   Flu Vaccine QUAD High Dose(Fluad)       Follow up plan: Return 1-2 months.   . This visit was completed via [employer] due to the restrictions of the COVID-19 pandemic. All issues as above were discussed and addressed. Physical exam was done as above through visual confirmation on [employer]. If it was felt that the patient should be evaluated in the office, they were directed there. The patient verbally consented to this visit. . Location of the patient: home . Location of the provider: work . Those involved with this call:  . Provider: MPark Liter DO . CMA: Tiffany Reel, CMA . Front Desk/Registration: CDon Perking . Time spent on call: 25 minutes with patient face to face via video conference. More than 50% of this time was spent in counseling and coordination of care. 40 minutes total spent in review of patient's record and preparation of their chart.

## 2019-04-25 NOTE — Assessment & Plan Note (Signed)
Will get her in for labs ASAP. Treat as needed. Call with any concerns. Refills given.

## 2019-04-25 NOTE — Assessment & Plan Note (Signed)
Not under food control. Will increase her losartan to 50mg  and recheck in 1-2 months.

## 2019-04-26 ENCOUNTER — Other Ambulatory Visit: Payer: PPO

## 2019-04-26 ENCOUNTER — Ambulatory Visit (INDEPENDENT_AMBULATORY_CARE_PROVIDER_SITE_OTHER): Payer: PPO

## 2019-04-26 ENCOUNTER — Other Ambulatory Visit: Payer: Self-pay

## 2019-04-26 DIAGNOSIS — E1159 Type 2 diabetes mellitus with other circulatory complications: Secondary | ICD-10-CM | POA: Diagnosis not present

## 2019-04-26 DIAGNOSIS — Z23 Encounter for immunization: Secondary | ICD-10-CM

## 2019-04-26 DIAGNOSIS — I10 Essential (primary) hypertension: Secondary | ICD-10-CM | POA: Diagnosis not present

## 2019-04-26 LAB — BAYER DCA HB A1C WAIVED: HB A1C (BAYER DCA - WAIVED): 7.4 % — ABNORMAL HIGH (ref <7.0)

## 2019-04-27 ENCOUNTER — Encounter: Payer: Self-pay | Admitting: Family Medicine

## 2019-04-27 LAB — COMPREHENSIVE METABOLIC PANEL
ALT: 14 IU/L (ref 0–32)
AST: 17 IU/L (ref 0–40)
Albumin/Globulin Ratio: 1.7 (ref 1.2–2.2)
Albumin: 4 g/dL (ref 3.7–4.7)
Alkaline Phosphatase: 40 IU/L (ref 39–117)
BUN/Creatinine Ratio: 24 (ref 12–28)
BUN: 21 mg/dL (ref 8–27)
Bilirubin Total: 0.3 mg/dL (ref 0.0–1.2)
CO2: 25 mmol/L (ref 20–29)
Calcium: 9.5 mg/dL (ref 8.7–10.3)
Chloride: 102 mmol/L (ref 96–106)
Creatinine, Ser: 0.88 mg/dL (ref 0.57–1.00)
GFR calc Af Amer: 73 mL/min/{1.73_m2} (ref >59)
GFR calc non Af Amer: 64 mL/min/{1.73_m2} (ref >59)
Globulin, Total: 2.3 g/dL (ref 1.5–4.5)
Glucose: 155 mg/dL — ABNORMAL HIGH (ref 65–99)
Potassium: 4.7 mmol/L (ref 3.5–5.2)
Sodium: 141 mmol/L (ref 134–144)
Total Protein: 6.3 g/dL (ref 6.0–8.5)

## 2019-07-08 ENCOUNTER — Other Ambulatory Visit: Payer: Self-pay | Admitting: Family Medicine

## 2019-07-08 NOTE — Telephone Encounter (Signed)
Requested Prescriptions  Pending Prescriptions Disp Refills  . clopidogrel (PLAVIX) 75 MG tablet [Pharmacy Med Name: Clopidogrel Bisulfate 75 MG Oral Tablet] 90 tablet 0    Sig: Take 1 tablet by mouth once daily     Hematology: Antiplatelets - clopidogrel Failed - 07/08/2019  4:42 PM      Failed - Evaluate AST, ALT within 2 months of therapy initiation.      Passed - ALT in normal range and within 360 days    ALT  Date Value Ref Range Status  04/26/2019 14 0 - 32 IU/L Final   SGPT (ALT)  Date Value Ref Range Status  02/17/2013 28 12 - 78 U/L Final         Passed - AST in normal range and within 360 days    AST  Date Value Ref Range Status  04/26/2019 17 0 - 40 IU/L Final   SGOT(AST)  Date Value Ref Range Status  02/17/2013 25 15 - 37 Unit/L Final         Passed - HCT in normal range and within 180 days    Hematocrit  Date Value Ref Range Status  01/20/2019 40.4 34.0 - 46.6 % Final         Passed - HGB in normal range and within 180 days    Hemoglobin  Date Value Ref Range Status  01/20/2019 13.1 11.1 - 15.9 g/dL Final         Passed - PLT in normal range and within 180 days    Platelets  Date Value Ref Range Status  01/20/2019 CANCELED x10E3/uL     Comment:    Unable to perform an accurate platelet count due to aggregation of the platelets.  Result canceled by the ancillary.          Passed - Valid encounter within last 6 months    Recent Outpatient Visits          2 months ago Type 2 diabetes mellitus with other circulatory complication, without long-term current use of insulin ([place])   [place], Susan P, DO   5 months ago Encounter for [employer] annual wellness exam   [employer], Susan P, DO   1 year ago Type 2 diabetes mellitus with other circulatory complication, without long-term current use of insulin ([place])   [place], [place], DO

## 2019-07-09 IMAGING — CR DG ABDOMEN 1V
1 series · 2 of 2 positions shown · non-contrast
Comparison: None.

CLINICAL DATA: Question foreign body. The patient reportedly
ingested 2 earrings with the backs on today.

EXAM:
ABDOMEN - 1 VIEW

[Series 1: dg abd 1 view · 0.14mm/px · 2 of 2 slices shown]
[im 1/2]
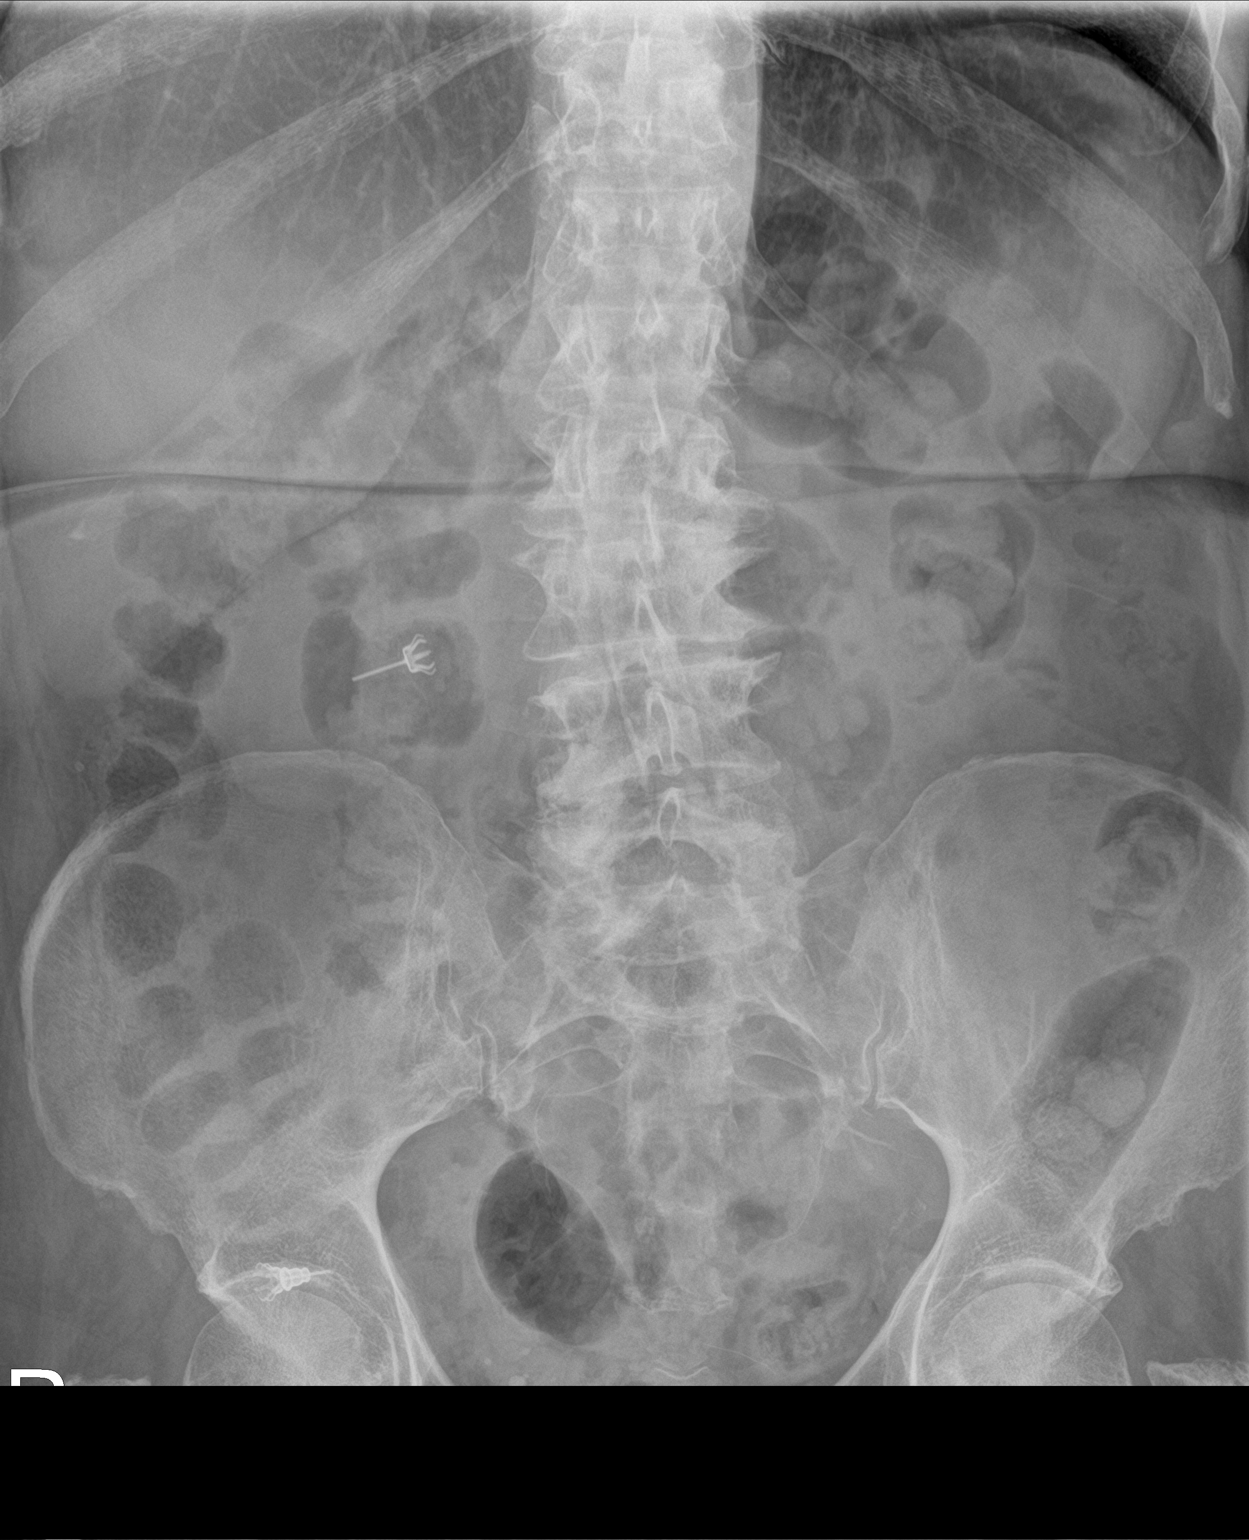
[im 2/2]
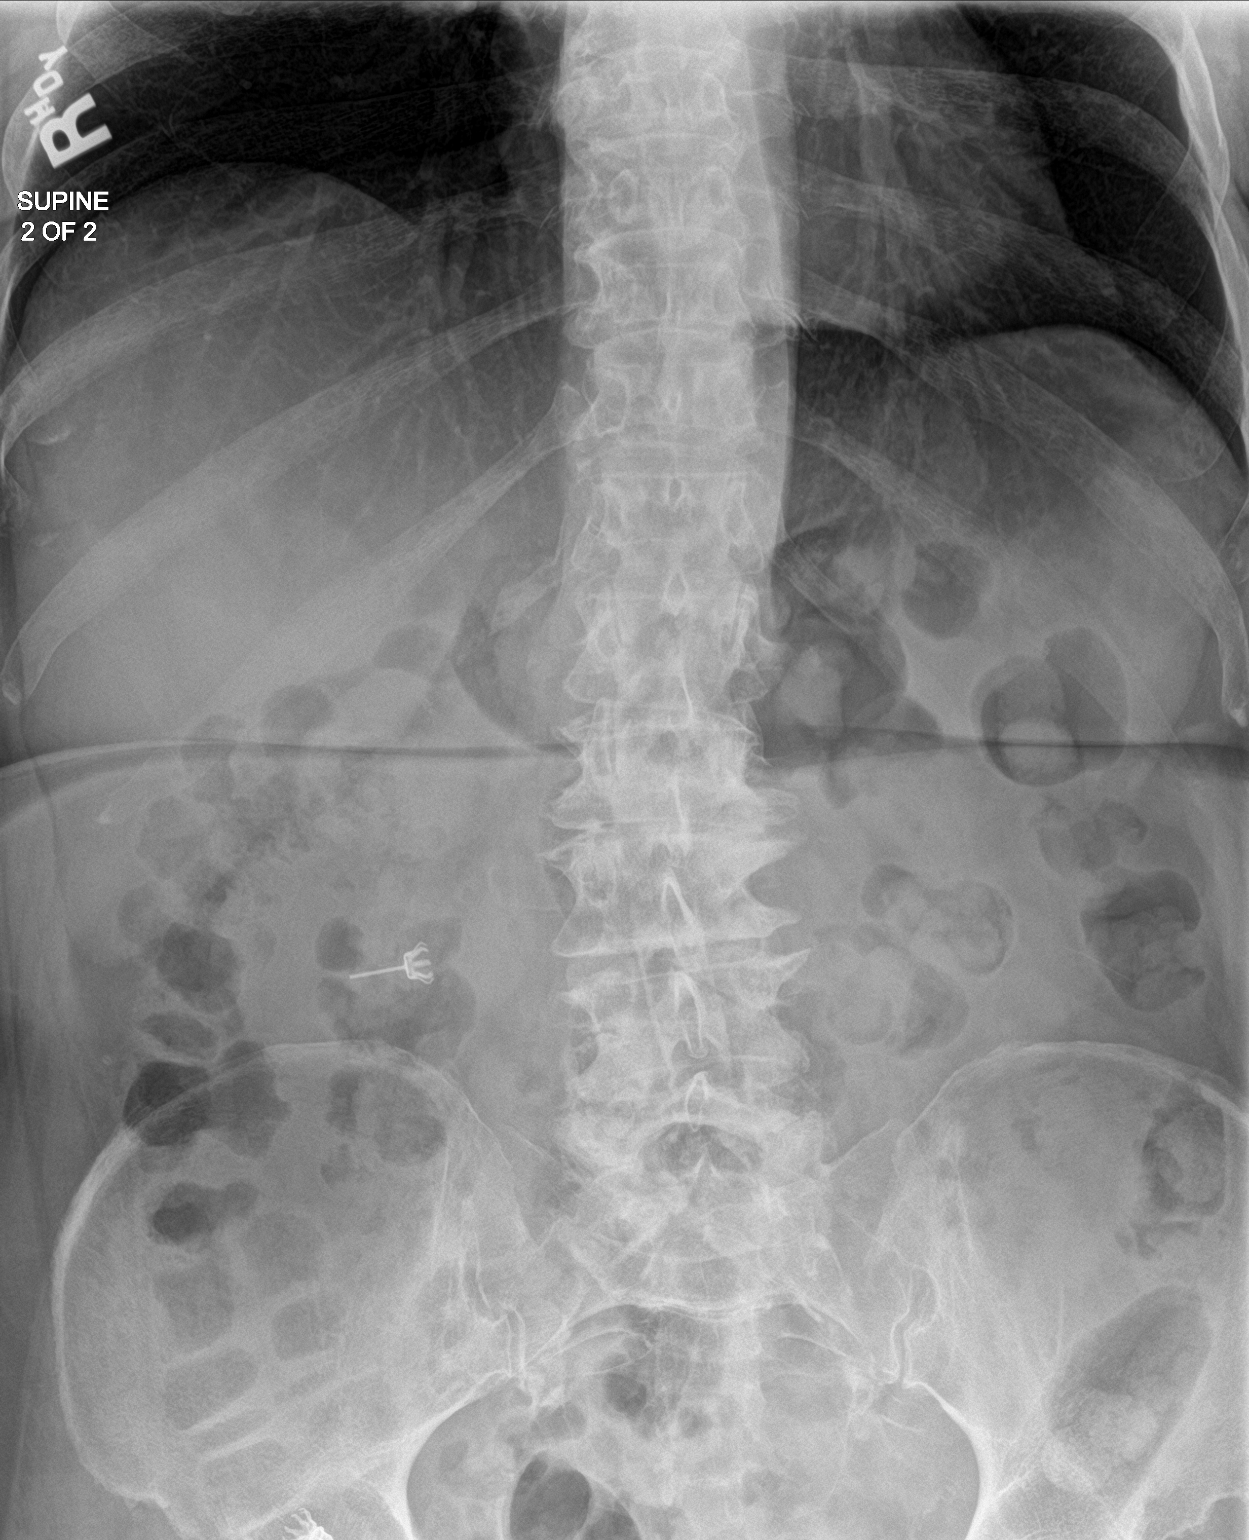

[2 of 2 positions shown; findings below may reference images not displayed]

FINDINGS: Two radiopaque foreign bodies are seen. The first projects in the
cecum in the second in the proximal transverse colon. The foreign
body in the cecum as an appearance consistent with an earring with
the back still in place. Back of the ear ring in the proximal
transverse colon is not seen.
IMPRESSION: Earrings in the cecum and proximal transverse colon. Back of the ear
ring in the proximal transverse colon is not identified. Ear ring in
the cecum appears to have the back in place.

## 2019-07-27 ENCOUNTER — Other Ambulatory Visit: Payer: Self-pay | Admitting: Family Medicine

## 2019-08-29 ENCOUNTER — Other Ambulatory Visit: Payer: Self-pay | Admitting: Family Medicine

## 2019-08-29 NOTE — Telephone Encounter (Signed)
Spoke with patient and advise that a short supply would be sent to pharmacy. Patient will schedule a follow up.

## 2019-08-30 ENCOUNTER — Ambulatory Visit: Payer: PPO | Attending: Internal Medicine

## 2019-08-30 DIAGNOSIS — Z23 Encounter for immunization: Secondary | ICD-10-CM

## 2019-08-30 NOTE — Progress Notes (Signed)
   Covid-19 Vaccination Clinic  Name:  Susan Allen    MRN: [id number] DOB: 06-22-1940  08/30/2019  [name] was observed post Covid-19 immunization for 15 minutes without incident. She was provided with Vaccine Information Sheet and instruction to access the V-Safe system.   [name] was instructed to call 911 with any severe reactions post vaccine: Marland Kitchen Difficulty breathing  . Swelling of face and throat  . A fast heartbeat  . A bad rash all over body  . Dizziness and weakness   Immunizations Administered    Name Date Dose VIS Date Route   Pfizer COVID-19 Vaccine 08/30/2019 11:27 AM 0.3 mL 05/05/2019 Intramuscular   Manufacturer: [place]   Lot: [id number]   [place]: [id number]

## 2019-09-01 ENCOUNTER — Other Ambulatory Visit: Payer: PPO

## 2019-09-01 ENCOUNTER — Ambulatory Visit: Payer: PPO | Admitting: Family Medicine

## 2019-09-01 ENCOUNTER — Other Ambulatory Visit: Payer: Self-pay

## 2019-09-22 ENCOUNTER — Encounter: Payer: Self-pay | Admitting: Nurse Practitioner

## 2019-09-22 ENCOUNTER — Ambulatory Visit (INDEPENDENT_AMBULATORY_CARE_PROVIDER_SITE_OTHER): Payer: PPO | Admitting: Nurse Practitioner

## 2019-09-22 ENCOUNTER — Other Ambulatory Visit: Payer: Self-pay

## 2019-09-22 ENCOUNTER — Other Ambulatory Visit: Payer: Self-pay | Admitting: Family Medicine

## 2019-09-22 ENCOUNTER — Telehealth: Payer: Self-pay | Admitting: Family Medicine

## 2019-09-22 VITALS — BP 145/68 | HR 80 | Temp 98.3°F | Wt 188.4 lb

## 2019-09-22 DIAGNOSIS — I25118 Atherosclerotic heart disease of native coronary artery with other forms of angina pectoris: Secondary | ICD-10-CM

## 2019-09-22 DIAGNOSIS — R079 Chest pain, unspecified: Secondary | ICD-10-CM

## 2019-09-22 DIAGNOSIS — F339 Major depressive disorder, recurrent, unspecified: Secondary | ICD-10-CM | POA: Diagnosis not present

## 2019-09-22 DIAGNOSIS — I10 Essential (primary) hypertension: Secondary | ICD-10-CM

## 2019-09-22 DIAGNOSIS — E1159 Type 2 diabetes mellitus with other circulatory complications: Secondary | ICD-10-CM | POA: Diagnosis not present

## 2019-09-22 DIAGNOSIS — E782 Mixed hyperlipidemia: Secondary | ICD-10-CM

## 2019-09-22 LAB — UA/M W/RFLX CULTURE, ROUTINE
Bilirubin, UA: NEGATIVE
Glucose, UA: NEGATIVE
Ketones, UA: NEGATIVE
Leukocytes,UA: NEGATIVE
Nitrite, UA: NEGATIVE
Protein,UA: NEGATIVE
RBC, UA: NEGATIVE
Specific Gravity, UA: 1.025 (ref 1.005–1.030)
Urobilinogen, Ur: 0.2 mg/dL (ref 0.2–1.0)
pH, UA: 5 (ref 5.0–7.5)

## 2019-09-22 LAB — MICROALBUMIN, URINE WAIVED
Creatinine, Urine Waived: 200 mg/dL (ref 10–300)
Microalb, Ur Waived: 30 mg/L — ABNORMAL HIGH (ref 0–19)
Microalb/Creat Ratio: <30 mg/g (ref <30)

## 2019-09-22 LAB — BAYER DCA HB A1C WAIVED: HB A1C (BAYER DCA - WAIVED): 7.5 % — ABNORMAL HIGH (ref <7.0)

## 2019-09-22 MED ORDER — METOPROLOL TARTRATE 25 MG PO TABS: ORAL_TABLET | ORAL | 2 refills | Status: DC

## 2019-09-22 NOTE — Progress Notes (Signed)
BP (!) 145/68   Pulse 80   Temp 98.3 F (36.8 C)   Wt 188 lb 6 oz (85.4 kg)   SpO2 96%   BMI 33.37 kg/m    Subjective:    Patient ID: Susan Allen, female    DOB: 1940-11-16, 79 y.o.   MRN: [id number]  HPI: Susan Allen is a 79 y.o. female presenting for chest pain.  Chief Complaint  Patient presents with  . Chest Pain  . 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  Since this event, she has been on Metoprolol and plavix.  She has been out of the Metoprolol for about 10 days and earlier this week, she developed "a bad cold" with a runny nose and draining sinuses.  This caused her to go into a coughing fit on Monday night which led to chest pain.  She took a nitroglycerin and the chest pain went away.  A couple of days later, she went for a walk and when she got back, she was having chest pain and had a take another one which provided relief from the chest pain.  Again yesterday, she was wheeling her empty trash cans up to her house and developed chest pain.  She took another nitroglycerin which provided relief.    She has not taken any nitroglycerin and has not had any chest pain today.   Time since onset: Duration:days Onset: sudden Quality: pressure - sharp Severity: 8/10 Location: left para substernal Radiation: left arm Episode duration: couple of minutes Frequency: intermittent Related to exertion: yes Activity when pain started: coughing and then walking Trauma: no Anxiety/recent stressors: no Aggravating factors:  Alleviating factors:  Status: stable Treatments attempted: nitroglycerin   Current pain status: pain free Shortness of breath: no Cough: yes, productive Nausea: no Diaphoresis: no Heartburn: no Palpitations: no  Allergies  Allergen Reactions  . Aspirin Hives and Other (See Comments)    "fainting"  . Lisinopril Cough   Outpatient  Encounter Medications as of 09/22/2019  Medication Sig  . alum & mag hydroxide-simeth (MYLANTA) 200-200-20 MG/5ML suspension Take 15 mLs by mouth every 6 (six) hours as needed for indigestion or heartburn.  . cetirizine (ZYRTEC) 10 MG tablet Take 10 mg by mouth as needed for allergies.  Marland Kitchen clopidogrel (PLAVIX) 75 MG tablet Take 1 tablet by mouth once daily  . diclofenac sodium (VOLTAREN) 1 % GEL Apply 4 g topically 4 (four) times daily.  . fenofibrate (TRICOR) 145 MG tablet TAKE 1 TABLET BY MOUTH ONCE DAILY AT BEDTIME  . isosorbide mononitrate (IMDUR) 30 MG 24 hr tablet Take 1 tablet (30 mg total) by mouth daily.  Marland Kitchen losartan (COZAAR) 50 MG tablet Take 1 tablet (50 mg total) by mouth daily.  . metFORMIN (GLUCOPHAGE) 500 MG tablet Take 2 tablets (1,000 mg total) by mouth 2 (two) times daily with a meal.  . metoprolol tartrate (LOPRESSOR) 25 MG tablet TAKE 1 & 1/2 (ONE & ONE-HALF) TABLETS BY MOUTH TWICE DAILY. NEED OFFICE VISIT FOR REFILLS  . nitroGLYCERIN (NITROSTAT) 0.4 MG SL tablet Place 0.4 mg under the tongue every 5 (five) minutes as needed for chest pain.  Marland Kitchen omeprazole (PRILOSEC) 20 MG capsule Take 1 capsule (20 mg total) by mouth daily.  Marland Kitchen PARoxetine (PAXIL) 20 MG tablet Take 2 tablets (40 mg total) by mouth at bedtime.  . pravastatin (PRAVACHOL)  40 MG tablet Take 2 tablets (80 mg total) by mouth at bedtime.  . Simethicone (GAS-X PO) Take by mouth.  . traZODone (DESYREL) 50 MG tablet Take 1 tablet (50 mg total) by mouth at bedtime.  . triamcinolone cream (KENALOG) 0.1 % Apply 1 application topically 2 (two) times daily.  . [DISCONTINUED] co-enzyme Q-10 30 MG capsule Take by mouth.  . [DISCONTINUED] metoprolol tartrate (LOPRESSOR) 25 MG tablet TAKE 1 & 1/2 (ONE & ONE-HALF) TABLETS BY MOUTH TWICE DAILY. NEED OFFICE VISIT FOR REFILLS   No facility-administered encounter medications on file as of 09/22/2019.   Patient Active Problem List   Diagnosis Date Noted  . Chest pain 09/25/2019  .  Depression, recurrent ([place]) 06/01/2018  . Carotid stenosis 10/12/2016  . Essential hypertension 10/12/2016  . Hyperlipidemia 10/12/2016  . Diabetes ([place]) 10/12/2016  . Coronary artery disease of native artery of native heart with stable angina pectoris ([place]) 10/12/2016   Past Medical History:  Diagnosis Date  . Allergy   . Arthritis   . Bronchitis   . Coronary artery disease   . Depression   . Diabetes mellitus without complication ([place])   . GERD (gastroesophageal reflux disease)   . Headache   . Hypertension   . Myocardial infarction [facility]) June 2014, July 2014   Relevant past medical, surgical, family and social history reviewed and updated as indicated. Interim medical history since our last visit reviewed.  Review of Systems  Constitutional: Negative.  Negative for activity change, appetite change, fatigue and fever.  HENT: Positive for congestion and rhinorrhea. Negative for ear discharge, ear pain, postnasal drip, sinus pressure, sinus pain, sneezing, sore throat and trouble swallowing.   Respiratory: Negative.  Negative for cough, chest tightness, shortness of breath and wheezing.   Cardiovascular: Negative.  Negative for chest pain, palpitations and leg swelling.  Gastrointestinal: Negative.  Negative for nausea and vomiting.  Skin: Negative.  Negative for color change and pallor.  Neurological: Negative.  Negative for dizziness, weakness, numbness and headaches.  Psychiatric/Behavioral: Negative.  Negative for confusion and sleep disturbance. The patient is not nervous/anxious.     Per HPI unless specifically indicated above     Objective:    BP (!) 145/68   Pulse 80   Temp 98.3 F (36.8 C)   Wt 188 lb 6 oz (85.4 kg)   SpO2 96%   BMI 33.37 kg/m   Wt Readings from Last 3 Encounters:  09/22/19 188 lb 6 oz (85.4 kg)  01/20/19 193 lb 8 oz (87.8 kg)  06/01/18 175 lb 9 oz (79.6 kg)    Physical Exam Vitals and nursing note reviewed.  Constitutional:       General: She is not in acute distress.    Appearance: She is well-developed. She is not toxic-appearing.  HENT:     Right Ear: External ear normal.     Left Ear: External ear normal.     Nose: Congestion present.     Mouth/Throat:     Mouth: Mucous membranes are moist.     Pharynx: Oropharynx is clear.  Eyes:     General: No scleral icterus.    Extraocular Movements: Extraocular movements intact.  Cardiovascular:     Rate and Rhythm: Normal rate and regular rhythm.     Heart sounds: Normal heart sounds.  Pulmonary:     Effort: Pulmonary effort is normal. No tachypnea, accessory muscle usage or respiratory distress.     Breath sounds: Normal breath sounds.  Abdominal:  General: Bowel sounds are normal.     Palpations: Abdomen is soft.  Musculoskeletal:     Cervical back: Normal range of motion.  Skin:    General: Skin is warm and dry.     Capillary Refill: Capillary refill takes less than 2 seconds.     Coloration: Skin is not cyanotic or pale.  Neurological:     General: No focal deficit present.     Mental Status: She is alert and oriented to person, place, and time.     Motor: No weakness.  Psychiatric:        Mood and Affect: Mood normal. Mood is not anxious.        Behavior: Behavior normal. Behavior is not agitated.       Assessment & Plan:   Problem List Items Addressed This Visit      Cardiovascular and Mediastinum   Essential hypertension   Relevant Medications   metoprolol tartrate (LOPRESSOR) 25 MG tablet   Other Relevant Orders   TSH (Completed)   Comprehensive metabolic panel (Completed)   CBC with Differential/Platelet (Completed)   Microalbumin, Urine Waived (Completed)   Coronary artery disease of native artery of native heart with stable angina pectoris (HCC)    Chronic, ongoing.  Previously under good control with Metoprolol 25 mg 1.5 tabs bid.  Will resume medication at current dose due to development of chest pain and strongly encouraged  patient to reach out to cardiologist regarding symptoms.  With any chest pain or shortness of breath unrelieved with nitroglycerin, go to ED.      Relevant Medications   metoprolol tartrate (LOPRESSOR) 25 MG tablet     Endocrine   Diabetes ([place])   Relevant Orders   Comprehensive metabolic panel (Completed)   CBC with Differential/Platelet (Completed)   Bayer DCA Hb A1c Waived (Completed)   UA/M w/rflx Culture, Routine (Completed)   Microalbumin, Urine Waived (Completed)     Other   Hyperlipidemia   Relevant Medications   metoprolol tartrate (LOPRESSOR) 25 MG tablet   Other Relevant Orders   Lipid Panel w/o Chol/HDL Ratio (Completed)   Comprehensive metabolic panel (Completed)   CBC with Differential/Platelet (Completed)   Depression, recurrent (HCC)   Relevant Orders   TSH (Completed)   Comprehensive metabolic panel (Completed)   CBC with Differential/Platelet (Completed)   Chest pain - Primary    Acute, stable.  Good relief with use of SL nitroglycerin, has used 3 times on 3 different days.  EKG with left bundle branch block, same as previous.  Chest pain likely due to increased HR due to discontinuance of metoprolol.  Metoprolol refilled and strongly encouraged to follow up with Cardiology provider ASAP to discuss symptoms.  If chest pain or shortness of breath unrelieved by nitroglycerin develop, go to ED.      Relevant Orders   EKG 12-Lead (Completed)       Follow up plan: Return as scheduled with PCP.

## 2019-09-23 LAB — COMPREHENSIVE METABOLIC PANEL
ALT: 15 IU/L (ref 0–32)
AST: 20 IU/L (ref 0–40)
Albumin/Globulin Ratio: 2 (ref 1.2–2.2)
Albumin: 4.3 g/dL (ref 3.7–4.7)
Alkaline Phosphatase: 44 IU/L (ref 39–117)
BUN/Creatinine Ratio: 15 (ref 12–28)
BUN: 13 mg/dL (ref 8–27)
Bilirubin Total: 0.2 mg/dL (ref 0.0–1.2)
CO2: 23 mmol/L (ref 20–29)
Calcium: 9.2 mg/dL (ref 8.7–10.3)
Chloride: 95 mmol/L — ABNORMAL LOW (ref 96–106)
Creatinine, Ser: 0.84 mg/dL (ref 0.57–1.00)
GFR calc Af Amer: 77 mL/min/{1.73_m2} (ref >59)
GFR calc non Af Amer: 67 mL/min/{1.73_m2} (ref >59)
Globulin, Total: 2.1 g/dL (ref 1.5–4.5)
Glucose: 175 mg/dL — ABNORMAL HIGH (ref 65–99)
Potassium: 4.8 mmol/L (ref 3.5–5.2)
Sodium: 134 mmol/L (ref 134–144)
Total Protein: 6.4 g/dL (ref 6.0–8.5)

## 2019-09-23 LAB — LIPID PANEL W/O CHOL/HDL RATIO
Cholesterol, Total: 167 mg/dL (ref 100–199)
HDL: 51 mg/dL (ref >39)
LDL Chol Calc (NIH): 92 mg/dL (ref 0–99)
Triglycerides: 135 mg/dL (ref 0–149)
VLDL Cholesterol Cal: 24 mg/dL (ref 5–40)

## 2019-09-23 LAB — CBC WITH DIFFERENTIAL/PLATELET
Basophils Absolute: 0 10*3/uL (ref 0.0–0.2)
Basos: 1 %
EOS (ABSOLUTE): 0.5 10*3/uL — ABNORMAL HIGH (ref 0.0–0.4)
Eos: 10 %
Hematocrit: 39.4 % (ref 34.0–46.6)
Hemoglobin: 12.6 g/dL (ref 11.1–15.9)
Immature Grans (Abs): 0 10*3/uL (ref 0.0–0.1)
Immature Granulocytes: 0 %
Lymphocytes Absolute: 1.5 10*3/uL (ref 0.7–3.1)
Lymphs: 30 %
MCH: 27.9 pg (ref 26.6–33.0)
MCHC: 32 g/dL (ref 31.5–35.7)
MCV: 87 fL (ref 79–97)
Monocytes Absolute: 0.5 10*3/uL (ref 0.1–0.9)
Monocytes: 11 %
Neutrophils Absolute: 2.3 10*3/uL (ref 1.4–7.0)
Neutrophils: 48 %
RBC: 4.52 x10E6/uL (ref 3.77–5.28)
RDW: 13.2 % (ref 11.7–15.4)
WBC: 4.8 10*3/uL (ref 3.4–10.8)

## 2019-09-23 LAB — TSH: TSH: 1.84 u[IU]/mL (ref 0.450–4.500)

## 2019-09-25 ENCOUNTER — Encounter: Payer: Self-pay | Admitting: Nurse Practitioner

## 2019-09-25 ENCOUNTER — Ambulatory Visit: Payer: PPO | Admitting: Nurse Practitioner

## 2019-09-25 DIAGNOSIS — R079 Chest pain, unspecified: Secondary | ICD-10-CM | POA: Insufficient documentation

## 2019-09-25 NOTE — Assessment & Plan Note (Addendum)
Acute, stable.  Good relief with use of SL nitroglycerin, has used 3 times on 3 different days.  EKG with left bundle branch block, same as previous.  Chest pain likely due to increased HR due to discontinuance of metoprolol.  Metoprolol refilled and strongly encouraged to follow up with Cardiology provider ASAP to discuss symptoms.  If chest pain or shortness of breath unrelieved by nitroglycerin develop, go to ED.

## 2019-09-25 NOTE — Assessment & Plan Note (Addendum)
Chronic, ongoing.  Previously under good control with Metoprolol 25 mg 1.5 tabs bid.  Will resume medication at current dose due to development of chest pain and strongly encouraged patient to reach out to cardiologist regarding symptoms.  With any chest pain or shortness of breath unrelieved with nitroglycerin, go to ED.

## 2019-09-25 NOTE — Telephone Encounter (Signed)
erroneous error  

## 2019-10-05 ENCOUNTER — Ambulatory Visit (INDEPENDENT_AMBULATORY_CARE_PROVIDER_SITE_OTHER): Payer: PPO | Admitting: Family Medicine

## 2019-10-05 ENCOUNTER — Encounter: Payer: Self-pay | Admitting: Family Medicine

## 2019-10-05 ENCOUNTER — Other Ambulatory Visit: Payer: Self-pay

## 2019-10-05 VITALS — BP 136/64 | HR 49 | Temp 98.5°F | Ht 62.0 in | Wt 190.2 lb

## 2019-10-05 DIAGNOSIS — L723 Sebaceous cyst: Secondary | ICD-10-CM

## 2019-10-05 DIAGNOSIS — I25118 Atherosclerotic heart disease of native coronary artery with other forms of angina pectoris: Secondary | ICD-10-CM | POA: Diagnosis not present

## 2019-10-05 DIAGNOSIS — I10 Essential (primary) hypertension: Secondary | ICD-10-CM | POA: Diagnosis not present

## 2019-10-05 DIAGNOSIS — E782 Mixed hyperlipidemia: Secondary | ICD-10-CM | POA: Diagnosis not present

## 2019-10-05 DIAGNOSIS — F339 Major depressive disorder, recurrent, unspecified: Secondary | ICD-10-CM | POA: Diagnosis not present

## 2019-10-05 DIAGNOSIS — E1165 Type 2 diabetes mellitus with hyperglycemia: Secondary | ICD-10-CM

## 2019-10-05 MED ORDER — FENOFIBRATE 145 MG PO TABS: 145.0000 mg | ORAL_TABLET | Freq: Every day | ORAL | 1 refills | Status: DC

## 2019-10-05 MED ORDER — ISOSORBIDE MONONITRATE ER 30 MG PO TB24: 30.0000 mg | ORAL_TABLET | Freq: Every day | ORAL | 1 refills | Status: DC

## 2019-10-05 MED ORDER — PAROXETINE HCL 20 MG PO TABS: 40.0000 mg | ORAL_TABLET | Freq: Every day | ORAL | 1 refills | Status: DC

## 2019-10-05 MED ORDER — OMEPRAZOLE 20 MG PO CPDR: 20.0000 mg | DELAYED_RELEASE_CAPSULE | Freq: Every day | ORAL | 3 refills | Status: DC

## 2019-10-05 MED ORDER — METOPROLOL TARTRATE 25 MG PO TABS: ORAL_TABLET | ORAL | 1 refills | Status: DC

## 2019-10-05 MED ORDER — CLOPIDOGREL BISULFATE 75 MG PO TABS: 75.0000 mg | ORAL_TABLET | Freq: Every day | ORAL | 1 refills | Status: DC

## 2019-10-05 MED ORDER — LOSARTAN POTASSIUM 50 MG PO TABS: 50.0000 mg | ORAL_TABLET | Freq: Every day | ORAL | 1 refills | Status: DC

## 2019-10-05 MED ORDER — PRAVASTATIN SODIUM 40 MG PO TABS: 80.0000 mg | ORAL_TABLET | Freq: Every day | ORAL | 1 refills | Status: DC

## 2019-10-05 MED ORDER — JARDIANCE 25 MG PO TABS: 25.0000 mg | ORAL_TABLET | Freq: Every day | ORAL | 3 refills | Status: DC

## 2019-10-05 MED ORDER — TRAZODONE HCL 50 MG PO TABS: 50.0000 mg | ORAL_TABLET | Freq: Every day | ORAL | 1 refills | Status: DC

## 2019-10-05 MED ORDER — METFORMIN HCL 500 MG PO TABS: 1000.0000 mg | ORAL_TABLET | Freq: Two times a day (BID) | ORAL | 1 refills | Status: DC

## 2019-10-05 NOTE — Assessment & Plan Note (Signed)
Better on recheck. Continue current regimen. Continue to monitor. Call with any concerns. Refills given today.

## 2019-10-05 NOTE — Assessment & Plan Note (Signed)
Under good control on current regimen. Continue current regimen. Continue to monitor. Call with any concerns. Refills given.   

## 2019-10-05 NOTE — Progress Notes (Signed)
BP 136/64 (BP Location: Left Arm, Cuff Size: Normal)   Pulse (!) 49   Temp 98.5 F (36.9 C) (Oral)   Ht 5\' 2"  (1.575 m)   Wt 190 lb 3.2 oz (86.3 kg)   SpO2 97%   BMI 34.79 kg/m    Subjective:    Patient ID: Susan Allen, female    DOB: 1941/01/01, 79 y.o.   MRN: [id number]  HPI: Susan Allen is a 79 y.o. female  Chief Complaint  Patient presents with  . Diabetes  . Hyperlipidemia  . Hypertension   HYPERTENSION / [place] Satisfied with current treatment? yes Duration of hypertension: chronic BP monitoring frequency: not checking BP medication side effects: no Past BP meds: losartan, metoprolol Duration of hyperlipidemia: chronic Cholesterol medication side effects: no Cholesterol supplements: none Past cholesterol medications: fenofibrate, pravastatin Medication compliance: excellent compliance Aspirin: no Recent stressors: no Recurrent headaches: no Visual changes: no Palpitations: no Dyspnea: no Chest pain: no Lower extremity edema: no Dizzy/lightheaded: no  DIABETES Hypoglycemic episodes:no Polydipsia/polyuria: no Visual disturbance: no Chest pain: no Paresthesias: no Glucose Monitoring: yes  Accucheck frequency: occasionally Taking Insulin?: no Blood Pressure Monitoring: not checking Retinal Examination: Up to Date Foot Exam: Up to Date Diabetic Education: Completed Pneumovax: Up to Date Influenza: Up to Date Aspirin: no  DEPRESSION Mood status: stable Satisfied with current treatment?: yes Symptom severity: mild  Duration of current treatment : chronic Side effects: no Medication compliance: excellent compliance Psychotherapy/counseling: no  Previous psychiatric medications: paxil Depressed mood: no Anxious mood: no Anhedonia: no Significant weight loss or gain: no Insomnia: no  Fatigue: no Feelings of worthlessness or guilt: no Impaired concentration/indecisiveness: no Suicidal ideations: no Hopelessness: no Crying spells:  no Depression screen [facility] 2/9 10/05/2019 09/22/2019 04/25/2019 01/20/2019 06/01/2018  Decreased Interest 0 1 0 0 3  Down, Depressed, Hopeless 0 1 0 0 3  PHQ - 2 Score 0 2 0 0 6  Altered sleeping 0 3 2 0 3  Tired, decreased energy 0 3 0 0 3  Change in appetite 0 1 0 0 0  Feeling bad or failure about yourself  0 0 0 0 1  Trouble concentrating 0 0 0 0 0  Moving slowly or fidgety/restless 0 0 0 0 0  Suicidal thoughts 0 0 0 0 0  PHQ-9 Score 0 9 2 0 13  Difficult doing work/chores - Not difficult at all Not difficult at all Not difficult at all Somewhat difficult    Relevant past medical, surgical, family and social history reviewed and updated as indicated. Interim medical history since our last visit reviewed. Allergies and medications reviewed and updated.  Review of Systems  Constitutional: Negative.   Respiratory: Negative.   Cardiovascular: Negative.   Gastrointestinal: Negative.   Musculoskeletal: Negative.   Skin: Negative.   Psychiatric/Behavioral: Negative.     Per HPI unless specifically indicated above     Objective:    BP 136/64 (BP Location: Left Arm, Cuff Size: Normal)   Pulse (!) 49   Temp 98.5 F (36.9 C) (Oral)   Ht 5\' 2"  (1.575 m)   Wt 190 lb 3.2 oz (86.3 kg)   SpO2 97%   BMI 34.79 kg/m   Wt Readings from Last 3 Encounters:  10/05/19 190 lb 3.2 oz (86.3 kg)  09/22/19 188 lb 6 oz (85.4 kg)  01/20/19 193 lb 8 oz (87.8 kg)    Physical Exam Vitals and nursing note reviewed.  Constitutional:      General: She  is not in acute distress.    Appearance: Normal appearance. She is not ill-appearing, toxic-appearing or diaphoretic.  HENT:     Head: Normocephalic and atraumatic.     Right Ear: External ear normal.     Left Ear: External ear normal.     Nose: Nose normal.     Mouth/Throat:     Mouth: Mucous membranes are moist.     Pharynx: Oropharynx is clear.  Eyes:     General: No scleral icterus.       Right eye: No discharge.        Left eye: No discharge.      Extraocular Movements: Extraocular movements intact.     Conjunctiva/sclera: Conjunctivae normal.     Pupils: Pupils are equal, round, and reactive to light.  Cardiovascular:     Rate and Rhythm: Normal rate and regular rhythm.     Pulses: Normal pulses.     Heart sounds: Normal heart sounds. No murmur. No friction rub. No gallop.   Pulmonary:     Effort: Pulmonary effort is normal. No respiratory distress.     Breath sounds: Normal breath sounds. No stridor. No wheezing, rhonchi or rales.  Chest:     Chest wall: No tenderness.  Musculoskeletal:        General: Normal range of motion.     Cervical back: Normal range of motion and neck supple.  Skin:    General: Skin is warm and dry.     Capillary Refill: Capillary refill takes less than 2 seconds.     Coloration: Skin is not jaundiced or pale.     Findings: No bruising, erythema, lesion or rash.     Comments: Sebaceous cyst on the back of her neck  Neurological:     General: No focal deficit present.     Mental Status: She is alert and oriented to person, place, and time. Mental status is at baseline.  Psychiatric:        Mood and Affect: Mood normal.        Behavior: Behavior normal.        Thought Content: Thought content normal.        Judgment: Judgment normal.     Results for orders placed or performed in visit on 09/22/19  Lipid Panel w/o Chol/HDL Ratio  Result Value Ref Range   Cholesterol, Total 167 100 - 199 mg/dL   Triglycerides 135 0 - 149 mg/dL   HDL 51 >39 mg/dL   VLDL Cholesterol Cal 24 5 - 40 mg/dL   LDL Chol Calc (NIH) 92 0 - 99 mg/dL  TSH  Result Value Ref Range   TSH 1.840 0.450 - 4.500 uIU/mL  Comprehensive metabolic panel  Result Value Ref Range   Glucose 175 (H) 65 - 99 mg/dL   BUN 13 8 - 27 mg/dL   Creatinine, Ser 0.84 0.57 - 1.00 mg/dL   GFR calc non Af Amer 67 >59 mL/min/1.73   GFR calc Af Amer 77 >59 mL/min/1.73   BUN/Creatinine Ratio 15 12 - 28   Sodium 134 134 - 144 mmol/L    Potassium 4.8 3.5 - 5.2 mmol/L   Chloride 95 (L) 96 - 106 mmol/L   CO2 23 20 - 29 mmol/L   Calcium 9.2 8.7 - 10.3 mg/dL   Total Protein 6.4 6.0 - 8.5 g/dL   Albumin 4.3 3.7 - 4.7 g/dL   Globulin, Total 2.1 1.5 - 4.5 g/dL   Albumin/Globulin Ratio 2.0 1.2 - 2.2  Bilirubin Total 0.2 0.0 - 1.2 mg/dL   Alkaline Phosphatase 44 39 - 117 IU/L   AST 20 0 - 40 IU/L   ALT 15 0 - 32 IU/L  CBC with Differential/Platelet  Result Value Ref Range   WBC 4.8 3.4 - 10.8 x10E3/uL   RBC 4.52 3.77 - 5.28 x10E6/uL   Hemoglobin 12.6 11.1 - 15.9 g/dL   Hematocrit 39.4 34.0 - 46.6 %   MCV 87 79 - 97 fL   MCH 27.9 26.6 - 33.0 pg   MCHC 32.0 31.5 - 35.7 g/dL   RDW 13.2 11.7 - 15.4 %   Platelets CANCELED x10E3/uL   Neutrophils 48 Not Estab. %   Lymphs 30 Not Estab. %   Monocytes 11 Not Estab. %   Eos 10 Not Estab. %   Basos 1 Not Estab. %   Neutrophils Absolute 2.3 1.4 - 7.0 x10E3/uL   Lymphocytes Absolute 1.5 0.7 - 3.1 x10E3/uL   Monocytes Absolute 0.5 0.1 - 0.9 x10E3/uL   EOS (ABSOLUTE) 0.5 (H) 0.0 - 0.4 x10E3/uL   Basophils Absolute 0.0 0.0 - 0.2 x10E3/uL   Immature Granulocytes 0 Not Estab. %   Immature Grans (Abs) 0.0 0.0 - 0.1 x10E3/uL   Hematology Comments: Note:   Bayer DCA Hb A1c Waived  Result Value Ref Range   HB A1C (BAYER DCA - WAIVED) 7.5 (H) <7.0 %  UA/M w/rflx Culture, Routine   Specimen: Blood   BLD  Result Value Ref Range   Specific Gravity, UA 1.025 1.005 - 1.030   pH, UA 5.0 5.0 - 7.5   Color, UA Yellow Yellow   Appearance Ur Clear Clear   Leukocytes,UA Negative Negative   Protein,UA Negative Negative/Trace   Glucose, UA Negative Negative   Ketones, UA Negative Negative   RBC, UA Negative Negative   Bilirubin, UA Negative Negative   Urobilinogen, Ur 0.2 0.2 - 1.0 mg/dL   Nitrite, UA Negative Negative  Microalbumin, Urine Waived  Result Value Ref Range   Microalb, Ur Waived 30 (H) 0 - 19 mg/L   Creatinine, Urine Waived 200 10 - 300 mg/dL   Microalb/Creat Ratio <30  <30 mg/g      Assessment & Plan:   Problem List Items Addressed This Visit      Cardiovascular and Mediastinum   Essential hypertension    Better on recheck. Continue current regimen. Continue to monitor. Call with any concerns. Refills given today.      Relevant Medications   pravastatin (PRAVACHOL) 40 MG tablet   metoprolol tartrate (LOPRESSOR) 25 MG tablet   losartan (COZAAR) 50 MG tablet   isosorbide mononitrate (IMDUR) 30 MG 24 hr tablet   fenofibrate (TRICOR) 145 MG tablet   Coronary artery disease of native artery of native heart with stable angina pectoris (HCC)    Will keep BP, cholesterol and sugars under good control. Continue to monitor. Call with any concerns.       Relevant Medications   traZODone (DESYREL) 50 MG tablet   PARoxetine (PAXIL) 20 MG tablet   pravastatin (PRAVACHOL) 40 MG tablet   metoprolol tartrate (LOPRESSOR) 25 MG tablet   losartan (COZAAR) 50 MG tablet   isosorbide mononitrate (IMDUR) 30 MG 24 hr tablet   fenofibrate (TRICOR) 145 MG tablet     Endocrine   Type 2 diabetes mellitus with hyperglycemia ([place]) - Primary    Not under great control with A1c of 7.5. Will continue metformin and add jardiance. Call with any concerns. Continue to monitor.  Recheck 3 months.       Relevant Medications   pravastatin (PRAVACHOL) 40 MG tablet   metFORMIN (GLUCOPHAGE) 500 MG tablet   losartan (COZAAR) 50 MG tablet   empagliflozin (JARDIANCE) 25 MG TABS tablet     Other   Hyperlipidemia    Under good control on current regimen. Continue current regimen. Continue to monitor. Call with any concerns. Refills given.        Relevant Medications   pravastatin (PRAVACHOL) 40 MG tablet   metoprolol tartrate (LOPRESSOR) 25 MG tablet   losartan (COZAAR) 50 MG tablet   isosorbide mononitrate (IMDUR) 30 MG 24 hr tablet   fenofibrate (TRICOR) 145 MG tablet   Depression, recurrent (HCC)    Under good control on current regimen. Continue current regimen. Continue  to monitor. Call with any concerns. Refills given.        Relevant Medications   traZODone (DESYREL) 50 MG tablet   PARoxetine (PAXIL) 20 MG tablet    Other Visit Diagnoses    Sebaceous cyst       Will get her in ASAP for removal. Call with any concerns.        Follow up plan: Return in about 3 months (around 01/05/2020) for ASAP for sebaceous cyst removal.

## 2019-10-05 NOTE — Assessment & Plan Note (Signed)
Will keep BP, cholesterol and sugars under good control. Continue to monitor. Call with any concerns.  

## 2019-10-05 NOTE — Assessment & Plan Note (Signed)
Not under great control with A1c of 7.5. Will continue metformin and add jardiance. Call with any concerns. Continue to monitor. Recheck 3 months.

## 2019-10-10 ENCOUNTER — Other Ambulatory Visit: Payer: Self-pay | Admitting: Family Medicine

## 2019-10-10 DIAGNOSIS — I2510 Atherosclerotic heart disease of native coronary artery without angina pectoris: Secondary | ICD-10-CM | POA: Diagnosis not present

## 2019-10-10 DIAGNOSIS — I6529 Occlusion and stenosis of unspecified carotid artery: Secondary | ICD-10-CM | POA: Diagnosis not present

## 2019-10-10 DIAGNOSIS — E118 Type 2 diabetes mellitus with unspecified complications: Secondary | ICD-10-CM | POA: Diagnosis not present

## 2019-10-10 DIAGNOSIS — I10 Essential (primary) hypertension: Secondary | ICD-10-CM | POA: Diagnosis not present

## 2019-10-10 DIAGNOSIS — I6522 Occlusion and stenosis of left carotid artery: Secondary | ICD-10-CM | POA: Diagnosis not present

## 2019-10-11 ENCOUNTER — Other Ambulatory Visit: Payer: Self-pay | Admitting: Family Medicine

## 2019-10-11 NOTE — Telephone Encounter (Signed)
[place] called and spoke to Susan Allen, [employer] about the medication refill requests. I advised that these medications were sent on 10/05/19. He agrees and says he will fill the request and to disregard this duplicate.

## 2019-10-24 ENCOUNTER — Other Ambulatory Visit: Payer: Self-pay

## 2019-10-24 ENCOUNTER — Ambulatory Visit (INDEPENDENT_AMBULATORY_CARE_PROVIDER_SITE_OTHER): Payer: PPO | Admitting: Family Medicine

## 2019-10-24 ENCOUNTER — Encounter: Payer: Self-pay | Admitting: Family Medicine

## 2019-10-24 VITALS — BP 147/73 | HR 54 | Temp 98.0°F | Ht 61.91 in | Wt 186.5 lb

## 2019-10-24 DIAGNOSIS — L723 Sebaceous cyst: Secondary | ICD-10-CM | POA: Diagnosis not present

## 2019-10-24 NOTE — Progress Notes (Signed)
BP (!) 147/73 (BP Location: Left Arm, Patient Position: Sitting, Cuff Size: Normal)   Pulse (!) 54   Temp 98 F (36.7 C) (Oral)   Ht 5' 1.91" (1.573 m)   Wt 186 lb 8 oz (84.6 kg)   SpO2 95%   BMI 34.21 kg/m    Subjective:    Patient ID: Susan Allen, female    DOB: 01-17-1941, 79 y.o.   MRN: [id number]  HPI: Susan Allen is a 79 y.o. female  Chief Complaint  Patient presents with  . Cyst    neck   LUMP Duration: months Location: back of her neck Onset: gradual Painful: no Discomfort: no Status:  bigger Trauma: no Redness: no Bruising: no Recent infection: no Swollen lymph nodes: no Requesting removal: yes History of cancer: no Family history of cancer: no History of the same: no  Relevant past medical, surgical, family and social history reviewed and updated as indicated. Interim medical history since our last visit reviewed. Allergies and medications reviewed and updated.  Review of Systems  Constitutional: Negative.   HENT: Negative.   Respiratory: Negative.   Cardiovascular: Negative.   Musculoskeletal: Negative.   Psychiatric/Behavioral: Negative.     Per HPI unless specifically indicated above     Objective:    BP (!) 147/73 (BP Location: Left Arm, Patient Position: Sitting, Cuff Size: Normal)   Pulse (!) 54   Temp 98 F (36.7 C) (Oral)   Ht 5' 1.91" (1.573 m)   Wt 186 lb 8 oz (84.6 kg)   SpO2 95%   BMI 34.21 kg/m   Wt Readings from Last 3 Encounters:  10/24/19 186 lb 8 oz (84.6 kg)  10/05/19 190 lb 3.2 oz (86.3 kg)  09/22/19 188 lb 6 oz (85.4 kg)    Physical Exam Vitals and nursing note reviewed.  Constitutional:      General: She is not in acute distress.    Appearance: Normal appearance. She is not ill-appearing, toxic-appearing or diaphoretic.  HENT:     Head: Normocephalic and atraumatic.     Right Ear: External ear normal.     Left Ear: External ear normal.     Nose: Nose normal.     Mouth/Throat:     Mouth: Mucous membranes  are moist.     Pharynx: Oropharynx is clear.  Eyes:     General: No scleral icterus.       Right eye: No discharge.        Left eye: No discharge.     Extraocular Movements: Extraocular movements intact.     Conjunctiva/sclera: Conjunctivae normal.     Pupils: Pupils are equal, round, and reactive to light.  Cardiovascular:     Rate and Rhythm: Normal rate and regular rhythm.     Pulses: Normal pulses.     Heart sounds: Normal heart sounds. No murmur. No friction rub. No gallop.   Pulmonary:     Effort: Pulmonary effort is normal. No respiratory distress.     Breath sounds: Normal breath sounds. No stridor. No wheezing, rhonchi or rales.  Chest:     Chest wall: No tenderness.  Musculoskeletal:        General: Normal range of motion.     Cervical back: Normal range of motion and neck supple.  Skin:    General: Skin is warm and dry.     Capillary Refill: Capillary refill takes less than 2 seconds.     Coloration: Skin is not jaundiced or pale.  Findings: No bruising, erythema, lesion or rash.     Comments: Sebaceous cyst on the back of her neck  Neurological:     General: No focal deficit present.     Mental Status: She is alert and oriented to person, place, and time. Mental status is at baseline.  Psychiatric:        Mood and Affect: Mood normal.        Behavior: Behavior normal.        Thought Content: Thought content normal.        Judgment: Judgment normal.     Results for orders placed or performed in visit on 09/22/19  Lipid Panel w/o Chol/HDL Ratio  Result Value Ref Range   Cholesterol, Total 167 100 - 199 mg/dL   Triglycerides 135 0 - 149 mg/dL   HDL 51 >39 mg/dL   VLDL Cholesterol Cal 24 5 - 40 mg/dL   LDL Chol Calc (NIH) 92 0 - 99 mg/dL  TSH  Result Value Ref Range   TSH 1.840 0.450 - 4.500 uIU/mL  Comprehensive metabolic panel  Result Value Ref Range   Glucose 175 (H) 65 - 99 mg/dL   BUN 13 8 - 27 mg/dL   Creatinine, Ser 0.84 0.57 - 1.00 mg/dL   GFR  calc non Af Amer 67 >59 mL/min/1.73   GFR calc Af Amer 77 >59 mL/min/1.73   BUN/Creatinine Ratio 15 12 - 28   Sodium 134 134 - 144 mmol/L   Potassium 4.8 3.5 - 5.2 mmol/L   Chloride 95 (L) 96 - 106 mmol/L   CO2 23 20 - 29 mmol/L   Calcium 9.2 8.7 - 10.3 mg/dL   Total Protein 6.4 6.0 - 8.5 g/dL   Albumin 4.3 3.7 - 4.7 g/dL   Globulin, Total 2.1 1.5 - 4.5 g/dL   Albumin/Globulin Ratio 2.0 1.2 - 2.2   Bilirubin Total 0.2 0.0 - 1.2 mg/dL   Alkaline Phosphatase 44 39 - 117 IU/L   AST 20 0 - 40 IU/L   ALT 15 0 - 32 IU/L  CBC with Differential/Platelet  Result Value Ref Range   WBC 4.8 3.4 - 10.8 x10E3/uL   RBC 4.52 3.77 - 5.28 x10E6/uL   Hemoglobin 12.6 11.1 - 15.9 g/dL   Hematocrit 39.4 34.0 - 46.6 %   MCV 87 79 - 97 fL   MCH 27.9 26.6 - 33.0 pg   MCHC 32.0 31.5 - 35.7 g/dL   RDW 13.2 11.7 - 15.4 %   Platelets CANCELED x10E3/uL   Neutrophils 48 Not Estab. %   Lymphs 30 Not Estab. %   Monocytes 11 Not Estab. %   Eos 10 Not Estab. %   Basos 1 Not Estab. %   Neutrophils Absolute 2.3 1.4 - 7.0 x10E3/uL   Lymphocytes Absolute 1.5 0.7 - 3.1 x10E3/uL   Monocytes Absolute 0.5 0.1 - 0.9 x10E3/uL   EOS (ABSOLUTE) 0.5 (H) 0.0 - 0.4 x10E3/uL   Basophils Absolute 0.0 0.0 - 0.2 x10E3/uL   Immature Granulocytes 0 Not Estab. %   Immature Grans (Abs) 0.0 0.0 - 0.1 x10E3/uL   Hematology Comments: Note:   Bayer DCA Hb A1c Waived  Result Value Ref Range   HB A1C (BAYER DCA - WAIVED) 7.5 (H) <7.0 %  UA/M w/rflx Culture, Routine   Specimen: Blood   BLD  Result Value Ref Range   Specific Gravity, UA 1.025 1.005 - 1.030   pH, UA 5.0 5.0 - 7.5   Color, UA Yellow Yellow  Appearance Ur Clear Clear   Leukocytes,UA Negative Negative   Protein,UA Negative Negative/Trace   Glucose, UA Negative Negative   Ketones, UA Negative Negative   RBC, UA Negative Negative   Bilirubin, UA Negative Negative   Urobilinogen, Ur 0.2 0.2 - 1.0 mg/dL   Nitrite, UA Negative Negative  Microalbumin, Urine Waived    Result Value Ref Range   Microalb, Ur Waived 30 (H) 0 - 19 mg/L   Creatinine, Urine Waived 200 10 - 300 mg/dL   Microalb/Creat Ratio <30 <30 mg/g      Assessment & Plan:   Problem List Items Addressed This Visit    None    Visit Diagnoses    Sebaceous cyst    -  Primary   Removed today as below. Return for suture removal in 7-10 days.      Skin Procedure  Procedure: Informed consent given.  Area infiltrated with lidocaine with epinephrine.  Lesioncompletely excised     Diagnosis:   ICD-10-CM   1. Sebaceous cyst  L72.3    Removed today as below. Return for suture removal in 7-10 days.    Lesion Location/Size: 1cm sebacous cyst on back of neck Physician: MJ Consent:  Risks, benefits, and alternative treatments discussed and all questions were answered.  Patient elected to proceed and verbal consent obtained.  Description: Area prepped and draped using semi-sterile technique. Area locally anesthetized using 3 cc's of lidocaine 2% with epi. Using a 15 blade scalpel, a 2cm incision was made encompassing the lesion. Cyst cavity entered and moderate amount of cheesy material expressed.  Cyst wall was removed in pieces using mosquito hemostat. Adequate hemostastis achieved using wound closure. Repair done using 5 4-0 vicryl simple interrupted stitches. Followed by steri strips and a dressing.   Post Procedure Instructions:  Wound care instructions discussed and patient was instructed to keep area clean and dry.  Signs and symptoms of infection discussed, patient agrees to contact the office ASAP should they occur.  Dressing change recommended every other day. Suture removal in 7-10 days   Follow up plan: Return 7-10 days suture removal.

## 2019-11-02 ENCOUNTER — Ambulatory Visit (INDEPENDENT_AMBULATORY_CARE_PROVIDER_SITE_OTHER): Payer: PPO | Admitting: Family Medicine

## 2019-11-02 ENCOUNTER — Encounter: Payer: Self-pay | Admitting: Family Medicine

## 2019-11-02 ENCOUNTER — Other Ambulatory Visit: Payer: Self-pay

## 2019-11-02 VITALS — BP 127/69 | HR 54 | Temp 98.1°F | Wt 186.0 lb

## 2019-11-02 DIAGNOSIS — Z4802 Encounter for removal of sutures: Secondary | ICD-10-CM

## 2019-11-02 NOTE — Progress Notes (Signed)
Wound well approximated and healing well. Sutures removed without complication.

## 2019-11-14 DIAGNOSIS — I2510 Atherosclerotic heart disease of native coronary artery without angina pectoris: Secondary | ICD-10-CM | POA: Diagnosis not present

## 2019-11-23 DIAGNOSIS — I6529 Occlusion and stenosis of unspecified carotid artery: Secondary | ICD-10-CM | POA: Diagnosis not present

## 2019-11-23 DIAGNOSIS — I10 Essential (primary) hypertension: Secondary | ICD-10-CM | POA: Diagnosis not present

## 2019-11-23 DIAGNOSIS — K2270 Barrett's esophagus without dysplasia: Secondary | ICD-10-CM | POA: Diagnosis not present

## 2019-11-23 DIAGNOSIS — I2510 Atherosclerotic heart disease of native coronary artery without angina pectoris: Secondary | ICD-10-CM | POA: Diagnosis not present

## 2019-11-23 DIAGNOSIS — I6522 Occlusion and stenosis of left carotid artery: Secondary | ICD-10-CM | POA: Diagnosis not present

## 2019-11-29 ENCOUNTER — Other Ambulatory Visit (INDEPENDENT_AMBULATORY_CARE_PROVIDER_SITE_OTHER): Payer: Self-pay | Admitting: Vascular Surgery

## 2019-11-29 DIAGNOSIS — I6522 Occlusion and stenosis of left carotid artery: Secondary | ICD-10-CM

## 2019-11-30 ENCOUNTER — Encounter (INDEPENDENT_AMBULATORY_CARE_PROVIDER_SITE_OTHER): Payer: Self-pay | Admitting: Vascular Surgery

## 2019-11-30 ENCOUNTER — Other Ambulatory Visit: Payer: Self-pay

## 2019-11-30 ENCOUNTER — Ambulatory Visit (INDEPENDENT_AMBULATORY_CARE_PROVIDER_SITE_OTHER): Payer: PPO

## 2019-11-30 ENCOUNTER — Ambulatory Visit (INDEPENDENT_AMBULATORY_CARE_PROVIDER_SITE_OTHER): Payer: PPO | Admitting: Vascular Surgery

## 2019-11-30 VITALS — BP 137/73 | HR 57 | Ht 62.0 in | Wt 185.0 lb

## 2019-11-30 DIAGNOSIS — I6522 Occlusion and stenosis of left carotid artery: Secondary | ICD-10-CM

## 2019-11-30 DIAGNOSIS — E118 Type 2 diabetes mellitus with unspecified complications: Secondary | ICD-10-CM | POA: Insufficient documentation

## 2019-11-30 DIAGNOSIS — F331 Major depressive disorder, recurrent, moderate: Secondary | ICD-10-CM | POA: Insufficient documentation

## 2019-11-30 DIAGNOSIS — I25118 Atherosclerotic heart disease of native coronary artery with other forms of angina pectoris: Secondary | ICD-10-CM | POA: Diagnosis not present

## 2019-11-30 DIAGNOSIS — I6523 Occlusion and stenosis of bilateral carotid arteries: Secondary | ICD-10-CM

## 2019-11-30 DIAGNOSIS — I10 Essential (primary) hypertension: Secondary | ICD-10-CM | POA: Diagnosis not present

## 2019-11-30 DIAGNOSIS — E782 Mixed hyperlipidemia: Secondary | ICD-10-CM

## 2019-11-30 DIAGNOSIS — E1129 Type 2 diabetes mellitus with other diabetic kidney complication: Secondary | ICD-10-CM | POA: Insufficient documentation

## 2019-12-03 ENCOUNTER — Encounter (INDEPENDENT_AMBULATORY_CARE_PROVIDER_SITE_OTHER): Payer: Self-pay | Admitting: Vascular Surgery

## 2019-12-03 NOTE — Progress Notes (Signed)
MRN : [id number]  Susan Allen is a 79 y.o. (Dec 28, 1940) female who presents with chief complaint of  Chief Complaint  Patient presents with  . Follow-up    u/S follow up  .  History of Present Illness:   The patient is seen for follow up evaluation of carotid stenosis. The carotid stenosis followed by ultrasound.   The patient denies amaurosis fugax. There is no recent history of TIA symptoms or focal motor deficits. There is no prior documented CVA.  The patient is taking enteric-coated aspirin 81 mg daily.  There is no history of migraine headaches. There is no history of seizures.  The patient has a history of coronary artery disease, no recent episodes of angina or shortness of breath. The patient denies PAD or claudication symptoms. There is a history of hyperlipidemia which is being treated with a statin.   Carotid Duplex done today shows <40% RICA and 76-73% LICA.  Increase in the left side compared to last study in 05/02/2013  Current Meds  Medication Sig  . alum & mag hydroxide-simeth (MYLANTA) 200-200-20 MG/5ML suspension Take 15 mLs by mouth every 6 (six) hours as needed for indigestion or heartburn.  . cetirizine (ZYRTEC) 10 MG tablet Take 10 mg by mouth as needed for allergies.  Marland Kitchen clopidogrel (PLAVIX) 75 MG tablet Take 1 tablet (75 mg total) by mouth daily.  . diclofenac sodium (VOLTAREN) 1 % GEL Apply 4 g topically 4 (four) times daily.  . diphenhydrAMINE (BENADRYL) 25 MG tablet Take 25 mg by mouth as needed.  . empagliflozin (JARDIANCE) 25 MG TABS tablet Take 25 mg by mouth daily before breakfast.  . fenofibrate (TRICOR) 145 MG tablet Take 1 tablet (145 mg total) by mouth at bedtime.  . isosorbide mononitrate (IMDUR) 30 MG 24 hr tablet Take 1 tablet (30 mg total) by mouth daily.  Marland Kitchen losartan (COZAAR) 50 MG tablet Take 1 tablet (50 mg total) by mouth daily.  . metFORMIN (GLUCOPHAGE) 500 MG tablet Take 2 tablets (1,000 mg total) by mouth 2 (two) times daily  with a meal.  . metoprolol tartrate (LOPRESSOR) 25 MG tablet TAKE 1 & 1/2 (ONE & ONE-HALF) TABLETS BY MOUTH TWICE DAILY.  . nitroGLYCERIN (NITROSTAT) 0.4 MG SL tablet Place 0.4 mg under the tongue every 5 (five) minutes as needed for chest pain.  Marland Kitchen omeprazole (PRILOSEC) 20 MG capsule Take 1 capsule (20 mg total) by mouth daily.  Marland Kitchen PARoxetine (PAXIL) 20 MG tablet Take 2 tablets (40 mg total) by mouth at bedtime.  . pravastatin (PRAVACHOL) 40 MG tablet Take 2 tablets (80 mg total) by mouth at bedtime.  . Simethicone (GAS-X PO) Take by mouth.  . traZODone (DESYREL) 50 MG tablet Take 1 tablet (50 mg total) by mouth at bedtime.  . triamcinolone cream (KENALOG) 0.1 % Apply 1 application topically 2 (two) times daily.    Past Medical History:  Diagnosis Date  . Allergy   . Arthritis   . Bronchitis   . Coronary artery disease   . Depression   . Diabetes mellitus without complication ([place])   . GERD (gastroesophageal reflux disease)   . Headache   . Hypertension   . Myocardial infarction [facility]) June 2014, July 2014    Past Surgical History:  Procedure Laterality Date  . ABDOMINAL HYSTERECTOMY    . APPENDECTOMY    . CORONARY ANGIOPLASTY     with stents   . DIGIT NAIL REMOVAL Bilateral 08/30/2015   Procedure: REMOVAL OF DIGIT  NAILS/ BIL. PERM.REMOVAL INGROWN GREAT TOE NAILS;  Surgeon: Albertine Patricia, DPM;  Location: [facility] ORS;  Service: Podiatry;  Laterality: Bilateral;  . 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 JOINT REPLACEMENT Bilateral    Total Knee Replacement, Dr Burnis Kingfisher, [place] History Social History   Tobacco Use  . Smoking status: Former Smoker    Packs/day: 0.25    Types: Cigarettes    Quit date: 02/27/2004    Years since quitting: 15.7  . Smokeless tobacco: Never Used  Vaping Use  .  Vaping Use: Former  Substance Use Topics  . Alcohol use: No  . Drug use: No    Family History Family History  Problem Relation Age of Onset  . Colon cancer Mother 46  . Cancer Mother        colon  . Cancer Father        lung  . Cancer Sister        ovarian  . Breast cancer Neg Hx     Allergies  Allergen Reactions  . Aspirin Hives and Other (See Comments)    "fainting"  . Lisinopril Cough     REVIEW OF SYSTEMS (Negative unless checked)  Constitutional: [] Weight loss  [] Fever  [] Chills Cardiac: [] Chest pain   [] Chest pressure   [] Palpitations   [] Shortness of breath when laying flat   [] Shortness of breath with exertion. Vascular:  [] Pain in legs with walking   [] Pain in legs at rest  [] History of DVT   [] Phlebitis   [] Swelling in legs   [] Varicose veins   [] Non-healing ulcers Pulmonary:   [] Uses home oxygen   [] Productive cough   [] Hemoptysis   [] Wheeze  [] COPD   [] Asthma Neurologic:  [] Dizziness   [] Seizures   [] History of stroke   [] History of TIA  [] Aphasia   [] Vissual changes   [] Weakness or numbness in arm   [] Weakness or numbness in leg Musculoskeletal:   [] Joint swelling   [] Joint pain   [] Low back pain Hematologic:  [] Easy bruising  [] Easy bleeding   [] Hypercoagulable state   [] Anemic Gastrointestinal:  [] Diarrhea   [] Vomiting  [] Gastroesophageal reflux/heartburn   [] Difficulty swallowing. Genitourinary:  [] Chronic kidney disease   [] Difficult urination  [] Frequent urination   [] Blood in urine Skin:  [] Rashes   [] Ulcers  Psychological:  [] History of anxiety   []  History of major depression.  Physical Examination  Vitals:   11/30/19 1433  BP: 137/73  Pulse: (!) 57  Weight: 185 lb (83.9 kg)  Height: 5\' 2"  (1.575 m)   Body mass index is 33.84 kg/m. Gen: WD/WN, NAD Head: [place]/AT, No temporalis wasting.  Ear/Nose/Throat: Hearing grossly intact, nares w/o erythema or drainage Eyes: PER, EOMI, sclera nonicteric.  Neck: Supple, no large masses.   Pulmonary:  Good  air movement, no audible wheezing bilaterally, no use of accessory muscles.  Cardiac: RRR, no JVD Vascular: bilateral carotid bruits Vessel Right Left  Radial Palpable Palpable  Carotid Palpable Palpable  Gastrointestinal: Non-distended. No guarding/no peritoneal signs.  Musculoskeletal: M/S 5/5 throughout.  No deformity or atrophy.  Neurologic: CN 2-12 intact. Symmetrical.  Speech is fluent. Motor exam as listed above. Psychiatric: Judgment intact, Mood & affect appropriate for pt's clinical situation. Dermatologic: No rashes or ulcers noted.  No changes consistent with cellulitis. Lymph : No lichenification or  skin changes of chronic lymphedema.  CBC Lab Results  Component Value Date   WBC 4.8 09/22/2019   HGB 12.6 09/22/2019   HCT 39.4 09/22/2019   MCV 87 09/22/2019   PLT CANCELED 09/22/2019    BMET    Component Value Date/Time   NA 134 09/22/2019 1318   NA 138 02/19/2013 0511   K 4.8 09/22/2019 1318   K 4.1 02/19/2013 0511   CL 95 (L) 09/22/2019 1318   CL 103 02/19/2013 0511   CO2 23 09/22/2019 1318   CO2 32 02/19/2013 0511   GLUCOSE 175 (H) 09/22/2019 1318   GLUCOSE 119 (H) 08/26/2015 1210   GLUCOSE 142 (H) 02/19/2013 0511   BUN 13 09/22/2019 1318   BUN 15 02/19/2013 0511   CREATININE 0.84 09/22/2019 1318   CREATININE 0.86 02/19/2013 0511   CALCIUM 9.2 09/22/2019 1318   CALCIUM 8.9 02/19/2013 0511   GFRNONAA 67 09/22/2019 1318   GFRNONAA >60 02/19/2013 0511   GFRAA 77 09/22/2019 1318   GFRAA >60 02/19/2013 0511   CrCl cannot be calculated (Patient's most recent lab result is older than the maximum 21 days allowed.).  COAG Lab Results  Component Value Date   INR 0.8 11/26/2012    Radiology VAS US CAROTID  Result Date: 11/30/2019 Carotid Arterial Duplex Study Indications: Carotid artery disease. Performing Technologist: Blondell Reveal RT, RDMS, RVT  Examination Guidelines: A complete evaluation includes B-mode imaging, spectral Doppler, color Doppler, and  power Doppler as needed of all accessible portions of each vessel. Bilateral testing is considered an integral part of a complete examination. Limited examinations for reoccurring indications may be performed as noted.  Right Carotid Findings: +----------+--------+--------+--------+------------------+-----------------+           PSV cm/sEDV cm/sStenosisPlaque DescriptionComments          +----------+--------+--------+--------+------------------+-----------------+ CCA Prox  82      12                                                  +----------+--------+--------+--------+------------------+-----------------+ CCA Mid   73      12              heterogenous                        +----------+--------+--------+--------+------------------+-----------------+ CCA Distal66      12              heterogenous                        +----------+--------+--------+--------+------------------+-----------------+ ICA Prox  106     22      1-39%   calcific          ICA/CCA ratio=1.5 +----------+--------+--------+--------+------------------+-----------------+ ICA Mid   94      18                                                  +----------+--------+--------+--------+------------------+-----------------+ ICA Distal86      20                                                  +----------+--------+--------+--------+------------------+-----------------+  ECA       199             >50%    calcific                            +----------+--------+--------+--------+------------------+-----------------+ +----------+--------+-------+----------------+-------------------+           PSV cm/sEDV cmsDescribe        Arm Pressure (mmHG) +----------+--------+-------+----------------+-------------------+ [id number]            Multiphasic, WNL                    +----------+--------+-------+----------------+-------------------+ +---------+--------+--+--------+---------+ VertebralPSV  cm/s37EDV cm/sAntegrade +---------+--------+--+--------+---------+ Left Carotid Findings: +----------+--------+--------+--------+------------------+-----------------+           PSV cm/sEDV cm/sStenosisPlaque DescriptionComments          +----------+--------+--------+--------+------------------+-----------------+ CCA Prox  71      14                                                  +----------+--------+--------+--------+------------------+-----------------+ CCA Mid   76      14                                                  +----------+--------+--------+--------+------------------+-----------------+ CCA Distal64      13              heterogenous                        +----------+--------+--------+--------+------------------+-----------------+ ICA Prox  207     44      40-59%  calcific          ICA/CCA ratio=2.7 +----------+--------+--------+--------+------------------+-----------------+ ICA Mid   134     23                                                  +----------+--------+--------+--------+------------------+-----------------+ ICA Distal90      18                                                  +----------+--------+--------+--------+------------------+-----------------+ ECA       210     7       >50%    calcific                            +----------+--------+--------+--------+------------------+-----------------+ +----------+--------+--------+----------------+-------------------+           PSV cm/sEDV cm/sDescribe        Arm Pressure (mmHG) +----------+--------+--------+----------------+-------------------+ [id number]             Multiphasic, WNL                    +----------+--------+--------+----------------+-------------------+ +---------+--------+--+--------+---------+ VertebralPSV cm/s57EDV cm/sAntegrade +---------+--------+--+--------+---------+  Summary: Right Carotid: Velocities in the right ICA are consistent with a  1-39% stenosis.  The ECA appears >50% stenosed. Left Carotid: Velocities in the left ICA are consistent with a low-end 60-79%               stenosis. The ECA appears >50% stenosed.                No significant change in the bilateral ICA velocities and ICA/CCA               ratios when compared to the previous exam on 02/07/18. *See table(s) above for measurements and observations.  Electronically signed by Hortencia Pilar MD on 11/30/2019 at 5:08:20 PM.    Final      Assessment/Plan 1. Bilateral carotid artery stenosis Recommend:  Given the patient's asymptomatic subcritical stenosis no further invasive testing or surgery at this time.  Duplex ultrasound shows RICA <40% (high grade external stenosis) and 76-72% LICA stenosis.  Continue antiplatelet therapy as prescribed Continue management of CAD, HTN and Hyperlipidemia Healthy heart diet,  encouraged exercise at least 4 times per week.  Follow up in 6 months with duplex ultrasound and physical exam based on >50% stenosis of the LICAarotid artery   - VAS US CAROTID; Future  2. Coronary artery disease of native artery of native heart with stable angina pectoris (HCC) Continue cardiac and antihypertensive medications as already ordered and reviewed, no changes at this time.  Continue statin as ordered and reviewed, no changes at this time  Nitrates PRN for chest pain   3. Essential hypertension Continue antihypertensive medications as already ordered, these medications have been reviewed and there are no changes at this time.   4. Mixed hyperlipidemia Continue statin as ordered and reviewed, no changes at this time   Hortencia Pilar, MD  12/03/2019 1:19 PM

## 2019-12-06 ENCOUNTER — Telehealth: Payer: Self-pay | Admitting: Family Medicine

## 2019-12-06 NOTE — Telephone Encounter (Signed)
Pt returning call. Please advise. °

## 2019-12-06 NOTE — Chronic Care Management (AMB) (Signed)
  Chronic Care Management   Outreach Note  12/06/2019 Name: Susan Allen MRN: [id number] DOB: Jun 15, 1940  Susan Allen is a 79 y.o. year old female who is a primary care patient of Valerie Roys, DO. I reached out to Susan Allen by phone today in response to a referral sent by Susan Allen's health plan.     An unsuccessful telephone outreach was attempted today. The patient was referred to the case management team for assistance with care management and care coordination.   Follow Up Plan: A HIPPA compliant phone message was left for the patient providing contact information and requesting a return call.  The care management team will reach out to the patient again over the next 7 days.  If patient returns call to provider office, please advise to call [facility]  at [place], [place], [place], [place] [address] Direct Dial: [phone] [email or web address]: [email or web address]

## 2019-12-20 NOTE — Chronic Care Management (AMB) (Signed)
  Chronic Care Management   Outreach Note  12/20/2019 Name: Susan Allen MRN: [id number] DOB: [birth year]  Susan Allen is a 79 y.o. year old female who is a primary care patient of Valerie Roys, DO. I reached out to Susan Allen by phone today in response to a referral sent by Susan Allen's health plan.     A second unsuccessful telephone outreach was attempted today. The patient was referred to the case management team for assistance with care management and care coordination.   Follow Up Plan: A HIPPA compliant phone message was left for the patient providing contact information and requesting a return call.  The care management team will reach out to the patient again over the next Susan Allen  days.  If patient returns call to provider office, please advise to call [facility]  at [place], [place], [place], [place] [address] Direct Dial: [phone] [email or web address] Website: [email or web address]

## 2019-12-25 NOTE — Chronic Care Management (AMB) (Signed)
  Chronic Care Management   Outreach Note  12/25/2019 Name: Susan Allen MRN: [id number] DOB: Nov 17, 1940  Susan Allen is a 79 y.o. year old female who is a primary care patient of Valerie Roys, DO. I reached out to Susan Allen by phone today in response to a referral sent by Susan Allen's health plan.     Third unsuccessful telephone outreach was attempted today. The patient was referred to the case management team for assistance with care management and care coordination. The patient's primary care provider has been notified of our unsuccessful attempts to make or maintain contact with the patient. The care management team is pleased to engage with this patient at any time in the future should he/she be interested in assistance from the care management team.   Follow Up Plan: The care management team is available to follow up with the patient after provider conversation with the patient regarding recommendation for care management engagement and subsequent re-referral to the care management team.   Noreene Larsson, [place], [place], [place] [address] Direct Dial: [phone] [email or web address]: [email or web address]

## 2020-01-05 ENCOUNTER — Ambulatory Visit: Payer: PPO | Admitting: Family Medicine

## 2020-01-16 ENCOUNTER — Encounter: Payer: Self-pay | Admitting: Family Medicine

## 2020-01-16 ENCOUNTER — Telehealth: Payer: Self-pay | Admitting: Family Medicine

## 2020-01-16 ENCOUNTER — Other Ambulatory Visit: Payer: Self-pay | Admitting: Family Medicine

## 2020-01-16 ENCOUNTER — Ambulatory Visit (INDEPENDENT_AMBULATORY_CARE_PROVIDER_SITE_OTHER): Payer: PPO | Admitting: Family Medicine

## 2020-01-16 ENCOUNTER — Other Ambulatory Visit: Payer: Self-pay

## 2020-01-16 VITALS — BP 172/62 | HR 55 | Temp 98.4°F | Wt 175.2 lb

## 2020-01-16 DIAGNOSIS — E1165 Type 2 diabetes mellitus with hyperglycemia: Secondary | ICD-10-CM

## 2020-01-16 DIAGNOSIS — H2513 Age-related nuclear cataract, bilateral: Secondary | ICD-10-CM | POA: Diagnosis not present

## 2020-01-16 DIAGNOSIS — Z1159 Encounter for screening for other viral diseases: Secondary | ICD-10-CM | POA: Diagnosis not present

## 2020-01-16 DIAGNOSIS — Z1231 Encounter for screening mammogram for malignant neoplasm of breast: Secondary | ICD-10-CM

## 2020-01-16 LAB — HM HEPATITIS C SCREENING LAB: HM Hepatitis Screen: NEGATIVE

## 2020-01-16 LAB — HM DIABETES EYE EXAM

## 2020-01-16 LAB — BAYER DCA HB A1C WAIVED: HB A1C (BAYER DCA - WAIVED): 6.1 % (ref <7.0)

## 2020-01-16 NOTE — Telephone Encounter (Signed)
Patient requesting freestyle meter and strips, informed please allow 48 to 72 hour turn around time  [place] ([address]), [address] Phone:  [phone]  Fax:  [phone]

## 2020-01-16 NOTE — Telephone Encounter (Signed)
Form filled out and placed in providers folder for signature.  

## 2020-01-16 NOTE — Progress Notes (Signed)
BP (!) 172/62 (BP Location: Left Arm, Cuff Size: Normal)   Pulse (!) 55   Temp 98.4 F (36.9 C) (Oral)   Wt 175 lb 3.2 oz (79.5 kg)   SpO2 96%   BMI 32.04 kg/m    Subjective:    Patient ID: Susan Allen, female    DOB: 11/14/1940, 79 y.o.   MRN: [id number]  HPI: Susan Allen is a 79 y.o. female  Chief Complaint  Patient presents with  . Diabetes   Was sick at the beginning of the month with stomach issues, and her BP was low. It regulated after about 2 weeks.   DIABETES- has only been taking 500mg  BID on her metformin Hypoglycemic episodes:no Polydipsia/polyuria: no Visual disturbance: no Chest pain: no Paresthesias: no Glucose Monitoring: no  Accucheck frequency: Not Checking Taking Insulin?: no Blood Pressure Monitoring: several times a week, usually in the 130s/70s Retinal Examination: Not up to Date Foot Exam: Up to Date Diabetic Education: Completed Pneumovax: Up to Date Influenza: not in stock Aspirin: yes  Relevant past medical, surgical, family and social history reviewed and updated as indicated. Interim medical history since our last visit reviewed. Allergies and medications reviewed and updated.  Review of Systems  Constitutional: Negative.   Respiratory: Negative.   Cardiovascular: Negative.   Gastrointestinal: Negative.   Musculoskeletal: Negative.   Psychiatric/Behavioral: Negative.     Per HPI unless specifically indicated above     Objective:    BP (!) 172/62 (BP Location: Left Arm, Cuff Size: Normal)   Pulse (!) 55   Temp 98.4 F (36.9 C) (Oral)   Wt 175 lb 3.2 oz (79.5 kg)   SpO2 96%   BMI 32.04 kg/m   Wt Readings from Last 3 Encounters:  01/16/20 175 lb 3.2 oz (79.5 kg)  11/30/19 185 lb (83.9 kg)  11/02/19 186 lb (84.4 kg)    Physical Exam Vitals and nursing note reviewed.  Constitutional:      General: She is not in acute distress.    Appearance: Normal appearance. She is not ill-appearing, toxic-appearing or diaphoretic.    HENT:     Head: Normocephalic and atraumatic.     Right Ear: External ear normal.     Left Ear: External ear normal.     Nose: Nose normal.     Mouth/Throat:     Mouth: Mucous membranes are moist.     Pharynx: Oropharynx is clear.  Eyes:     General: No scleral icterus.       Right eye: No discharge.        Left eye: No discharge.     Extraocular Movements: Extraocular movements intact.     Conjunctiva/sclera: Conjunctivae normal.     Pupils: Pupils are equal, round, and reactive to light.  Cardiovascular:     Rate and Rhythm: Normal rate and regular rhythm.     Pulses: Normal pulses.     Heart sounds: Normal heart sounds. No murmur heard.  No friction rub. No gallop.   Pulmonary:     Effort: Pulmonary effort is normal. No respiratory distress.     Breath sounds: Normal breath sounds. No stridor. No wheezing, rhonchi or rales.  Chest:     Chest wall: No tenderness.  Musculoskeletal:        General: Normal range of motion.     Cervical back: Normal range of motion and neck supple.  Skin:    General: Skin is warm and dry.  Capillary Refill: Capillary refill takes less than 2 seconds.     Coloration: Skin is not jaundiced or pale.     Findings: No bruising, erythema, lesion or rash.  Neurological:     General: No focal deficit present.     Mental Status: She is alert and oriented to person, place, and time. Mental status is at baseline.  Psychiatric:        Mood and Affect: Mood normal.        Behavior: Behavior normal.        Thought Content: Thought content normal.        Judgment: Judgment normal.     Results for orders placed or performed in visit on 09/22/19  Lipid Panel w/o Chol/HDL Ratio  Result Value Ref Range   Cholesterol, Total 167 100 - 199 mg/dL   Triglycerides 135 0 - 149 mg/dL   HDL 51 >39 mg/dL   VLDL Cholesterol Cal 24 5 - 40 mg/dL   LDL Chol Calc (NIH) 92 0 - 99 mg/dL  TSH  Result Value Ref Range   TSH 1.840 0.450 - 4.500 uIU/mL   Comprehensive metabolic panel  Result Value Ref Range   Glucose 175 (H) 65 - 99 mg/dL   BUN 13 8 - 27 mg/dL   Creatinine, Ser 0.84 0.57 - 1.00 mg/dL   GFR calc non Af Amer 67 >59 mL/min/1.73   GFR calc Af Amer 77 >59 mL/min/1.73   BUN/Creatinine Ratio 15 12 - 28   Sodium 134 134 - 144 mmol/L   Potassium 4.8 3.5 - 5.2 mmol/L   Chloride 95 (L) 96 - 106 mmol/L   CO2 23 20 - 29 mmol/L   Calcium 9.2 8.7 - 10.3 mg/dL   Total Protein 6.4 6.0 - 8.5 g/dL   Albumin 4.3 3.7 - 4.7 g/dL   Globulin, Total 2.1 1.5 - 4.5 g/dL   Albumin/Globulin Ratio 2.0 1.2 - 2.2   Bilirubin Total 0.2 0.0 - 1.2 mg/dL   Alkaline Phosphatase 44 39 - 117 IU/L   AST 20 0 - 40 IU/L   ALT 15 0 - 32 IU/L  CBC with Differential/Platelet  Result Value Ref Range   WBC 4.8 3.4 - 10.8 x10E3/uL   RBC 4.52 3.77 - 5.28 x10E6/uL   Hemoglobin 12.6 11.1 - 15.9 g/dL   Hematocrit 39.4 34.0 - 46.6 %   MCV 87 79 - 97 fL   MCH 27.9 26.6 - 33.0 pg   MCHC 32.0 31 - 35 g/dL   RDW 13.2 11.7 - 15.4 %   Platelets CANCELED x10E3/uL   Neutrophils 48 Not Estab. %   Lymphs 30 Not Estab. %   Monocytes 11 Not Estab. %   Eos 10 Not Estab. %   Basos 1 Not Estab. %   Neutrophils Absolute 2.3 1 - 7 x10E3/uL   Lymphocytes Absolute 1.5 0 - 3 x10E3/uL   Monocytes Absolute 0.5 0 - 0 x10E3/uL   EOS (ABSOLUTE) 0.5 (H) 0.0 - 0.4 x10E3/uL   Basophils Absolute 0.0 0 - 0 x10E3/uL   Immature Granulocytes 0 Not Estab. %   Immature Grans (Abs) 0.0 0.0 - 0.1 x10E3/uL   Hematology Comments: Note:   Bayer DCA Hb A1c Waived  Result Value Ref Range   HB A1C (BAYER DCA - WAIVED) 7.5 (H) <7.0 %  UA/M w/rflx Culture, Routine   Specimen: Blood   BLD  Result Value Ref Range   Specific Gravity, UA 1.025 1.005 - 1.030   pH,  UA 5.0 5.0 - 7.5   Color, UA Yellow Yellow   Appearance Ur Clear Clear   Leukocytes,UA Negative Negative   Protein,UA Negative Negative/Trace   Glucose, UA Negative Negative   Ketones, UA Negative Negative   RBC, UA Negative  Negative   Bilirubin, UA Negative Negative   Urobilinogen, Ur 0.2 0.2 - 1.0 mg/dL   Nitrite, UA Negative Negative  Microalbumin, Urine Waived  Result Value Ref Range   Microalb, Ur Waived 30 (H) 0 - 19 mg/L   Creatinine, Urine Waived 200 10 - 300 mg/dL   Microalb/Creat Ratio <30 <30 mg/g      Assessment & Plan:   Problem List Items Addressed This Visit      Endocrine   Type 2 diabetes mellitus with hyperglycemia ([place]) - Primary    Has only been taking 1/2 of her metformin. Go back to full dose. OK to stop jardiance. Recheck labs 3 months.      Relevant Medications   rosuvastatin (CRESTOR) 20 MG tablet   Other Relevant Orders   Bayer DCA Hb A1c Waived    Other Visit Diagnoses    Need for hepatitis C screening test       Labs drawn today. Await results. Treat as needed.    Relevant Orders   Hepatitis C Antibody       Follow up plan: Return in about 3 months (around 04/17/2020).

## 2020-01-16 NOTE — Assessment & Plan Note (Signed)
Has only been taking 1/2 of her metformin. Go back to full dose. OK to stop jardiance. Recheck labs 3 months.

## 2020-01-17 ENCOUNTER — Encounter: Payer: Self-pay | Admitting: Family Medicine

## 2020-01-17 LAB — HEPATITIS C ANTIBODY: Hep C Virus Ab: <0.1 s/co ratio (ref 0.0–0.9)

## 2020-01-17 NOTE — Telephone Encounter (Signed)
Form signed and faxed

## 2020-01-18 NOTE — Telephone Encounter (Signed)
ROBIN [phone] / question about medication

## 2020-02-02 ENCOUNTER — Ambulatory Visit (INDEPENDENT_AMBULATORY_CARE_PROVIDER_SITE_OTHER): Payer: PPO

## 2020-02-02 VITALS — Ht 62.0 in | Wt 174.0 lb

## 2020-02-02 DIAGNOSIS — Z0000 Encounter for general adult medical examination without abnormal findings: Secondary | ICD-10-CM | POA: Diagnosis not present

## 2020-02-02 NOTE — Progress Notes (Signed)
I connected with Susan Allen today by telephone and verified that I am speaking with the correct person using two identifiers. Location patient: home Location provider: work Persons participating in the virtual visit: [place], [place] LPN.   I discussed the limitations, risks, security and privacy concerns of performing an evaluation and management service by telephone and the availability of in person appointments. I also discussed with the patient that there may be a patient responsible charge related to this service. The patient expressed understanding and verbally consented to this telephonic visit.    Interactive audio and video telecommunications were attempted between this provider and patient, however failed, due to patient having technical difficulties OR patient did not have access to video capability.  We continued and completed visit with audio only.     Vital signs may be patient reported or missing.  Subjective:   ROSMARIE Allen is a 79 y.o. female who presents for Medicare Annual (Subsequent) preventive examination.  Review of Systems     Cardiac Risk Factors include: advanced age (>55men, >60 women);diabetes mellitus;dyslipidemia;obesity (BMI >30kg/m2);sedentary lifestyle;hypertension     Objective:    Today's Vitals   02/02/20 1026  Weight: 174 lb (78.9 kg)  Height: 5\' 2"  (1.575 m)   Body mass index is 31.83 kg/m.  Advanced Directives 02/02/2020 02/04/2017 10/12/2016 08/30/2015 08/26/2015  Does Patient Have a Medical Advance Directive? No No No No No  Would patient like information on creating a medical advance directive? - - - No - patient declined information No - patient declined information    Current Medications (verified) Outpatient Encounter Medications as of 02/02/2020  Medication Sig  . alum & mag hydroxide-simeth (MYLANTA) 200-200-20 MG/5ML suspension Take 15 mLs by mouth every 6 (six) hours as needed for indigestion or heartburn.  . cetirizine (ZYRTEC)  10 MG tablet Take 10 mg by mouth as needed for allergies.  Marland Kitchen clopidogrel (PLAVIX) 75 MG tablet Take 1 tablet (75 mg total) by mouth daily.  . Coenzyme Q10 100 MG capsule Take 1 capsule by mouth daily.  . diclofenac sodium (VOLTAREN) 1 % GEL Apply 4 g topically 4 (four) times daily.  . diphenhydrAMINE (BENADRYL) 25 MG tablet Take 25 mg by mouth as needed.  . fenofibrate (TRICOR) 145 MG tablet Take 1 tablet (145 mg total) by mouth at bedtime.  . isosorbide mononitrate (IMDUR) 30 MG 24 hr tablet Take 1 tablet (30 mg total) by mouth daily.  Marland Kitchen losartan (COZAAR) 50 MG tablet Take 1 tablet (50 mg total) by mouth daily.  . metFORMIN (GLUCOPHAGE) 500 MG tablet Take 2 tablets (1,000 mg total) by mouth 2 (two) times daily with a meal.  . metoprolol tartrate (LOPRESSOR) 25 MG tablet TAKE 1 & 1/2 (ONE & ONE-HALF) TABLETS BY MOUTH TWICE DAILY.  . nitroGLYCERIN (NITROSTAT) 0.4 MG SL tablet Place 0.4 mg under the tongue every 5 (five) minutes as needed for chest pain.  Marland Kitchen omeprazole (PRILOSEC) 20 MG capsule Take 1 capsule (20 mg total) by mouth daily.  Marland Kitchen PARoxetine (PAXIL) 20 MG tablet Take 2 tablets (40 mg total) by mouth at bedtime.  . pravastatin (PRAVACHOL) 40 MG tablet Take 2 tablets (80 mg total) by mouth at bedtime.  . rosuvastatin (CRESTOR) 20 MG tablet Take 20 mg by mouth daily.  . Simethicone (GAS-X PO) Take by mouth.  . traZODone (DESYREL) 50 MG tablet Take 1 tablet (50 mg total) by mouth at bedtime.  . triamcinolone cream (KENALOG) 0.1 % Apply 1 application topically 2 (  two) times daily.   No facility-administered encounter medications on file as of 02/02/2020.    Allergies (verified) Aspirin and Lisinopril   History: Past Medical History:  Diagnosis Date  . Allergy   . Arthritis   . Bronchitis   . Coronary artery disease   . Depression   . Diabetes mellitus without complication ([place])   . GERD (gastroesophageal reflux disease)   . Headache   . Hypertension   . Myocardial infarction  [facility]) June 2014, July 2014   Past Surgical History:  Procedure Laterality Date  . ABDOMINAL HYSTERECTOMY    . APPENDECTOMY    . CORONARY ANGIOPLASTY     with stents   . DIGIT NAIL REMOVAL Bilateral 08/30/2015   Procedure: REMOVAL OF DIGIT NAILS/ BIL. PERM.REMOVAL INGROWN GREAT TOE NAILS;  Surgeon: Albertine Patricia, DPM;  Location: [facility] ORS;  Service: Podiatry;  Laterality: Bilateral;  . 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 JOINT REPLACEMENT Bilateral    Total Knee Replacement, Dr Burnis Kingfisher, [facility]  . TONSILLECTOMY     Family History  Problem Relation Age of Onset  . Colon cancer Mother 70  . Cancer Mother        colon  . Cancer Father        lung  . Cancer Sister        ovarian  . Breast cancer Neg Hx    Social History   Socioeconomic History  . Marital status: Married    Spouse name: Not on file  . Number of children: Not on file  . Years of education: Not on file  . Highest education level: Not on file  Occupational History  . Occupation: retired  Tobacco Use  . Smoking status: Former Smoker    Packs/day: 0.25    Types: Cigarettes    Quit date: 02/27/2004    Years since quitting: 15.9  . Smokeless tobacco: Never Used  Vaping Use  . Vaping Use: Former  Substance and Sexual Activity  . Alcohol use: No  . Drug use: No  . Sexual activity: Not Currently  Other Topics Concern  . Not on file  Social History Narrative  . Not on file   Social Determinants of Health   Financial Resource Strain: Low Risk   . Difficulty of Paying Living Expenses: Not hard at all  Food Insecurity: No Food Insecurity  . Worried About Charity fundraiser in the Last Year: Never true  . Ran Out of Food in the Last Year: Never true  Transportation Needs: No Transportation Needs  . Lack of Transportation (Medical): No  . Lack of  Transportation (Non-Medical): No  Physical Activity: Inactive  . Days of Exercise per Week: 0 days  . Minutes of Exercise per Session: 0 min  Stress: No Stress Concern Present  . Feeling of Stress : Not at all  Social Connections:   . Frequency of Communication with Friends and Family: Not on file  . Frequency of Social Gatherings with Friends and Family: Not on file  . Attends Religious Services: Not on file  . Active Member of Clubs or Organizations: Not on file  . Attends Archivist Meetings: Not on file  . Marital Status: Not on file    Tobacco Counseling Counseling given: Not Answered   Clinical Intake:  Pre-visit preparation completed: Yes  Pain :  No/denies pain     Nutritional Status: BMI > 30  Obese Nutritional Risks: None Diabetes: Yes  How often do you need to have someone help you when you read instructions, pamphlets, or other written materials from your doctor or pharmacy?: 1 - Never What is the last grade level you completed in school?: 12th grade  Diabetic? Yes Nutrition Risk Assessment:  Has the patient had any N/V/D within the last 2 months?  No  Does the patient have any non-healing wounds?  No  Has the patient had any unintentional weight loss or weight gain?  No   Diabetes:  Is the patient diabetic?  Yes  If diabetic, was a CBG obtained today?  No  Did the patient bring in their glucometer from home?  No  How often do you monitor your CBG's? doesn't.   Financial Strains and Diabetes Management:  Are you having any financial strains with the device, your supplies or your medication? No .  Does the patient want to be seen by Chronic Care Management for management of their diabetes?  No  Would the patient like to be referred to a Nutritionist or for Diabetic Management?  No   Diabetic Exams:  Diabetic Eye Exam: Completed 01/2020 Diabetic Foot Exam: Overdue, Pt has been advised about the importance in completing this exam. Pt is  scheduled for diabetic foot exam on next appointment.  Interpreter Needed?: No  Information entered by :: NAllen LPN   Activities of Daily Living In your present state of health, do you have any difficulty performing the following activities: 02/02/2020  Hearing? N  Vision? N  Difficulty concentrating or making decisions? N  Walking or climbing stairs? N  Dressing or bathing? N  Doing errands, shopping? N  Preparing Food and eating ? N  Using the Toilet? N  In the past six months, have you accidently leaked urine? Y  Do you have problems with loss of bowel control? N  Managing your Medications? N  Managing your Finances? N  Some recent data might be hidden    Patient Care Team: Valerie Roys, DO as PCP - General (Family Medicine)  Indicate any recent Medical Services you may have received from other than [facility] providers in the past year (date may be approximate).     Assessment:   This is a routine wellness examination for Leidy.  Hearing/Vision screen  Hearing Screening   125Hz  250Hz  500Hz  1000Hz  2000Hz  3000Hz  4000Hz  6000Hz  8000Hz   Right ear:           Left ear:           Vision Screening Comments: Regular eye exams, [facility]   Dietary issues and exercise activities discussed: Current Exercise Habits: The patient does not participate in regular exercise at present  Goals    . Patient Stated     02/02/2020, wants to weigh 140 pounds      Depression Screen PHQ 2/9 Scores 02/02/2020 01/16/2020 10/05/2019 09/22/2019 04/25/2019 01/20/2019 06/01/2018  PHQ - 2 Score 0 2 0 2 0 0 6  PHQ- 9 Score 0 3 0 9 2 0 13    Fall Risk Fall Risk  02/02/2020 01/20/2019 06/01/2018  Falls in the past year? 0 0 0  Number falls in past yr: - 0 0  Injury with Fall? - 0 0  Risk for fall due to : Medication side effect - -  Follow up Falls evaluation completed;Education provided;Falls prevention discussed - -    Any  stairs in or around the home? Yes  If so, are there any without  handrails? No  Home free of loose throw rugs in walkways, pet beds, electrical cords, etc? Yes  Adequate lighting in your home to reduce risk of falls? Yes   ASSISTIVE DEVICES UTILIZED TO PREVENT FALLS:  Life alert? No  Use of a cane, walker or w/c? No  Grab bars in the bathroom? No  Shower chair or bench in shower? No  Elevated toilet seat or a handicapped toilet? Yes   TIMED UP AND GO:  Was the test performed? No . .    Cognitive Function:     6CIT Screen 02/02/2020 01/20/2019  What Year? 0 points 0 points  What month? 0 points 0 points  What time? 0 points 0 points  Count back from 20 0 points 0 points  Months in reverse 0 points 0 points  Repeat phrase 0 points 2 points  Total Score 0 2    Immunizations Immunization History  Administered Date(s) Administered  . Fluad Quad(high Dose 65+) 04/26/2019  . Influenza, High Dose Seasonal PF 03/23/2017  . Influenza-Unspecified 03/26/2015, 03/31/2016, 02/23/2018  . PFIZER SARS-COV-2 Vaccination 07/31/2019, 08/30/2019  . Pneumococcal Conjugate-13 03/18/2015  . Pneumococcal Polysaccharide-23 03/31/2016  . Tdap 12/25/2011    TDAP status: Up to date Flu Vaccine status: Up to date Pneumococcal vaccine status: Up to date Covid-19 vaccine status: Completed vaccines  Qualifies for Shingles Vaccine? Yes   Zostavax completed No   Shingrix Completed?: No.    Education has been provided regarding the importance of this vaccine. Patient has been advised to call insurance company to determine out of pocket expense if they have not yet received this vaccine. Advised may also receive vaccine at local pharmacy or Health Dept. Verbalized acceptance and understanding.  Screening Tests Health Maintenance  Topic Date Due  . OPHTHALMOLOGY EXAM  06/25/2018  . INFLUENZA VACCINE  12/24/2019  . FOOT EXAM  01/20/2020  . HEMOGLOBIN A1C  07/18/2020  . TETANUS/TDAP  12/24/2021  . DEXA SCAN  Completed  . COVID-19 Vaccine  Completed  . Hepatitis  C Screening  Completed  . PNA vac Low Risk Adult  Completed    Health Maintenance  Health Maintenance Due  Topic Date Due  . OPHTHALMOLOGY EXAM  06/25/2018  . INFLUENZA VACCINE  12/24/2019  . FOOT EXAM  01/20/2020    Colorectal cancer screening: Completed 09/28/2017 . Repeat every 10 years Mammogram status: scheduled Bone Density status: Completed 09/13/2013.  Lung Cancer Screening: (Low Dose CT Chest recommended if Age 44-80 years, 30 pack-year currently smoking OR have quit w/in 15years.) does not qualify.   Lung Cancer Screening Referral: no  Additional Screening:  Hepatitis C Screening: does qualify; Completed 01/16/2020  Vision Screening: Recommended annual ophthalmology exams for early detection of glaucoma and other disorders of the eye. Is the patient up to date with their annual eye exam?  Yes  Who is the provider or what is the name of the office in which the patient attends annual eye exams? [facility] If pt is not established with a provider, would they like to be referred to a provider to establish care? No .   Dental Screening: Recommended annual dental exams for proper oral hygiene  Community Resource Referral / Chronic Care Management: CRR required this visit?  No   CCM required this visit?  No      Plan:     I have personally reviewed and noted the following  in the patient's chart:   . Medical and social history . Use of alcohol, tobacco or illicit drugs  . Current medications and supplements . Functional ability and status . Nutritional status . Physical activity . Advanced directives . List of other physicians . Hospitalizations, surgeries, and ER visits in previous 12 months . Vitals . Screenings to include cognitive, depression, and falls . Referrals and appointments  In addition, I have reviewed and discussed with patient certain preventive protocols, quality metrics, and best practice recommendations. A written personalized care  plan for preventive services as well as general preventive health recommendations were provided to patient.     Kellie Simmering, LPN   [id number]/[id number]   Nurse Notes:

## 2020-02-02 NOTE — Patient Instructions (Signed)
Susan Allen , Thank you for taking time to come for your Medicare Wellness Visit. I appreciate your ongoing commitment to your health goals. Please review the following plan we discussed and let me know if I can assist you in the future.   Screening recommendations/referrals: Colonoscopy: completed 09/28/2017 Mammogram: scheduled Bone Density: completed 09/13/2013 Recommended yearly ophthalmology/optometry visit for glaucoma screening and checkup Recommended yearly dental visit for hygiene and checkup  Vaccinations: Influenza vaccine: due Pneumococcal vaccine: completed 03/31/2016 Tdap vaccine: 12/25/2011 Shingles vaccine: discussed   Covid-19: 08/30/2019, 07/31/2019  Advanced directives: Advance directive discussed with you today.    Conditions/risks identified: none  Next appointment: Follow up in one year for your annual wellness visit    Preventive Care 65 Years and Older, Female Preventive care refers to lifestyle choices and visits with your health care provider that can promote health and wellness. What does preventive care include?  A yearly physical exam. This is also called an annual well check.  Dental exams once or twice a year.  Routine eye exams. Ask your health care provider how often you should have your eyes checked.  Personal lifestyle choices, including:  Daily care of your teeth and gums.  Regular physical activity.  Eating a healthy diet.  Avoiding tobacco and drug use.  Limiting alcohol use.  Practicing safe sex.  Taking low-dose aspirin every day.  Taking vitamin and mineral supplements as recommended by your health care provider. What happens during an annual well check? The services and screenings done by your health care provider during your annual well check will depend on your age, overall health, lifestyle risk factors, and family history of disease. Counseling  Your health care provider may ask you questions about your:  Alcohol use.  Tobacco  use.  Drug use.  Emotional well-being.  Home and relationship well-being.  Sexual activity.  Eating habits.  History of falls.  Memory and ability to understand (cognition).  Work and work Statistician.  Reproductive health. Screening  You may have the following tests or measurements:  Height, weight, and BMI.  Blood pressure.  Lipid and cholesterol levels. These may be checked every 5 years, or more frequently if you are over 7 years old.  Skin check.  Lung cancer screening. You may have this screening every year starting at age 6 if you have a 30-pack-year history of smoking and currently smoke or have quit within the past 15 years.  Fecal occult blood test (FOBT) of the stool. You may have this test every year starting at age 48.  Flexible sigmoidoscopy or colonoscopy. You may have a sigmoidoscopy every 5 years or a colonoscopy every 10 years starting at age 72.  Hepatitis C blood test.  Hepatitis B blood test.  Sexually transmitted disease (STD) testing.  Diabetes screening. This is done by checking your blood sugar (glucose) after you have not eaten for a while (fasting). You may have this done every 1-3 years.  Bone density scan. This is done to screen for osteoporosis. You may have this done starting at age 21.  Mammogram. This may be done every 1-2 years. Talk to your health care provider about how often you should have regular mammograms. Talk with your health care provider about your test results, treatment options, and if necessary, the need for more tests. Vaccines  Your health care provider may recommend certain vaccines, such as:  Influenza vaccine. This is recommended every year.  Tetanus, diphtheria, and acellular pertussis (Tdap, Td) vaccine. You may need  a Td booster every 10 years.  Zoster vaccine. You may need this after age 53.  Pneumococcal 13-valent conjugate (PCV13) vaccine. One dose is recommended after age 68.  Pneumococcal  polysaccharide (PPSV23) vaccine. One dose is recommended after age 20. Talk to your health care provider about which screenings and vaccines you need and how often you need them. This information is not intended to replace advice given to you by your health care provider. Make sure you discuss any questions you have with your health care provider. Document Released: 06/07/2015 Document Revised: 01/29/2016 Document Reviewed: 03/12/2015 Elsevier Interactive Patient Education  2017 [employer] Prevention in the Home Falls can cause injuries. They can happen to people of all ages. There are many things you can do to make your home safe and to help prevent falls. What can I do on the outside of my home?  Regularly fix the edges of walkways and driveways and fix any cracks.  Remove anything that might make you trip as you walk through a door, such as a raised step or threshold.  Trim any bushes or trees on the path to your home.  Use bright outdoor lighting.  Clear any walking paths of anything that might make someone trip, such as rocks or tools.  Regularly check to see if handrails are loose or broken. Make sure that both sides of any steps have handrails.  Any raised decks and porches should have guardrails on the edges.  Have any leaves, snow, or ice cleared regularly.  Use sand or salt on walking paths during winter.  Clean up any spills in your garage right away. This includes oil or grease spills. What can I do in the bathroom?  Use night lights.  Install grab bars by the toilet and in the tub and shower. Do not use towel bars as grab bars.  Use non-skid mats or decals in the tub or shower.  If you need to sit down in the shower, use a plastic, non-slip stool.  Keep the floor dry. Clean up any water that spills on the floor as soon as it happens.  Remove soap buildup in the tub or shower regularly.  Attach bath mats securely with double-sided non-slip rug  tape.  Do not have throw rugs and other things on the floor that can make you trip. What can I do in the bedroom?  Use night lights.  Make sure that you have a light by your bed that is easy to reach.  Do not use any sheets or blankets that are too big for your bed. They should not hang down onto the floor.  Have a firm chair that has side arms. You can use this for support while you get dressed.  Do not have throw rugs and other things on the floor that can make you trip. What can I do in the kitchen?  Clean up any spills right away.  Avoid walking on wet floors.  Keep items that you use a lot in easy-to-reach places.  If you need to reach something above you, use a strong step stool that has a grab bar.  Keep electrical cords out of the way.  Do not use floor polish or wax that makes floors slippery. If you must use wax, use non-skid floor wax.  Do not have throw rugs and other things on the floor that can make you trip. What can I do with my stairs?  Do not leave any items on the  stairs.  Make sure that there are handrails on both sides of the stairs and use them. Fix handrails that are broken or loose. Make sure that handrails are as long as the stairways.  Check any carpeting to make sure that it is firmly attached to the stairs. Fix any carpet that is loose or worn.  Avoid having throw rugs at the top or bottom of the stairs. If you do have throw rugs, attach them to the floor with carpet tape.  Make sure that you have a light switch at the top of the stairs and the bottom of the stairs. If you do not have them, ask someone to add them for you. What else can I do to help prevent falls?  Wear shoes that:  Do not have high heels.  Have rubber bottoms.  Are comfortable and fit you well.  Are closed at the toe. Do not wear sandals.  If you use a stepladder:  Make sure that it is fully opened. Do not climb a closed stepladder.  Make sure that both sides of the  stepladder are locked into place.  Ask someone to hold it for you, if possible.  Clearly mark and make sure that you can see:  Any grab bars or handrails.  First and last steps.  Where the edge of each step is.  Use tools that help you move around (mobility aids) if they are needed. These include:  Canes.  Walkers.  Scooters.  Crutches.  Turn on the lights when you go into a dark area. Replace any light bulbs as soon as they burn out.  Set up your furniture so you have a clear path. Avoid moving your furniture around.  If any of your floors are uneven, fix them.  If there are any pets around you, be aware of where they are.  Review your medicines with your doctor. Some medicines can make you feel dizzy. This can increase your chance of falling. Ask your doctor what other things that you can do to help prevent falls. This information is not intended to replace advice given to you by your health care provider. Make sure you discuss any questions you have with your health care provider. Document Released: 03/07/2009 Document Revised: 10/17/2015 Document Reviewed: 06/15/2014 Elsevier Interactive Patient Education  2017 [employer].

## 2020-02-16 ENCOUNTER — Other Ambulatory Visit: Payer: Self-pay | Admitting: Family Medicine

## 2020-03-01 ENCOUNTER — Other Ambulatory Visit: Payer: Self-pay

## 2020-03-01 ENCOUNTER — Ambulatory Visit
Admission: RE | Admit: 2020-03-01 | Discharge: 2020-03-01 | Disposition: A | Discharge status: Home / Self Care | Payer: PPO | Source: Ambulatory Visit | Attending: Family Medicine | Admitting: Family Medicine

## 2020-03-01 DIAGNOSIS — Z1231 Encounter for screening mammogram for malignant neoplasm of breast: Secondary | ICD-10-CM | POA: Insufficient documentation

## 2020-03-05 ENCOUNTER — Other Ambulatory Visit: Payer: Self-pay | Admitting: Family Medicine

## 2020-03-05 DIAGNOSIS — R928 Other abnormal and inconclusive findings on diagnostic imaging of breast: Secondary | ICD-10-CM

## 2020-03-05 DIAGNOSIS — N6489 Other specified disorders of breast: Secondary | ICD-10-CM

## 2020-03-22 ENCOUNTER — Ambulatory Visit
Admission: RE | Admit: 2020-03-22 | Discharge: 2020-03-22 | Disposition: A | Discharge status: Home / Self Care | Payer: PPO | Source: Ambulatory Visit | Attending: Family Medicine | Admitting: Family Medicine

## 2020-03-22 ENCOUNTER — Other Ambulatory Visit: Payer: Self-pay

## 2020-03-22 DIAGNOSIS — R928 Other abnormal and inconclusive findings on diagnostic imaging of breast: Secondary | ICD-10-CM | POA: Insufficient documentation

## 2020-03-22 DIAGNOSIS — N6489 Other specified disorders of breast: Secondary | ICD-10-CM

## 2020-03-25 ENCOUNTER — Encounter: Payer: Self-pay | Admitting: Family Medicine

## 2020-04-09 ENCOUNTER — Other Ambulatory Visit: Payer: Self-pay | Admitting: Family Medicine

## 2020-04-15 ENCOUNTER — Other Ambulatory Visit: Payer: Self-pay | Admitting: Family Medicine

## 2020-04-23 ENCOUNTER — Ambulatory Visit (INDEPENDENT_AMBULATORY_CARE_PROVIDER_SITE_OTHER): Payer: PPO | Admitting: Family Medicine

## 2020-04-23 ENCOUNTER — Other Ambulatory Visit: Payer: Self-pay

## 2020-04-23 ENCOUNTER — Encounter: Payer: Self-pay | Admitting: Family Medicine

## 2020-04-23 VITALS — BP 138/70 | HR 51 | Temp 97.9°F | Ht 62.01 in | Wt 172.0 lb

## 2020-04-23 DIAGNOSIS — L509 Urticaria, unspecified: Secondary | ICD-10-CM | POA: Diagnosis not present

## 2020-04-23 DIAGNOSIS — E1165 Type 2 diabetes mellitus with hyperglycemia: Secondary | ICD-10-CM | POA: Diagnosis not present

## 2020-04-23 DIAGNOSIS — F339 Major depressive disorder, recurrent, unspecified: Secondary | ICD-10-CM

## 2020-04-23 DIAGNOSIS — Z800 Family history of malignant neoplasm of digestive organs: Secondary | ICD-10-CM

## 2020-04-23 DIAGNOSIS — E782 Mixed hyperlipidemia: Secondary | ICD-10-CM

## 2020-04-23 DIAGNOSIS — I10 Essential (primary) hypertension: Secondary | ICD-10-CM | POA: Diagnosis not present

## 2020-04-23 DIAGNOSIS — Z23 Encounter for immunization: Secondary | ICD-10-CM

## 2020-04-23 LAB — BAYER DCA HB A1C WAIVED: HB A1C (BAYER DCA - WAIVED): 6.3 % (ref <7.0)

## 2020-04-23 MED ORDER — NITROGLYCERIN 0.4 MG SL SUBL: 0.4000 mg | SUBLINGUAL_TABLET | SUBLINGUAL | 12 refills | Status: DC | PRN

## 2020-04-23 MED ORDER — METOPROLOL TARTRATE 25 MG PO TABS
37.5000 mg | ORAL_TABLET | Freq: Two times a day (BID) | ORAL | 1 refills | Status: DC
Start: 2020-04-23 — End: 2020-10-23

## 2020-04-23 MED ORDER — ISOSORBIDE MONONITRATE ER 30 MG PO TB24
30.0000 mg | ORAL_TABLET | Freq: Every day | ORAL | 1 refills | Status: DC
Start: 2020-04-23 — End: 2020-09-24

## 2020-04-23 MED ORDER — PAROXETINE HCL 20 MG PO TABS
40.0000 mg | ORAL_TABLET | Freq: Every day | ORAL | 1 refills | Status: DC
Start: 2020-04-23 — End: 2020-10-23

## 2020-04-23 MED ORDER — EZETIMIBE 10 MG PO TABS: 10.0000 mg | ORAL_TABLET | Freq: Every day | ORAL | 1 refills | Status: DC

## 2020-04-23 MED ORDER — METFORMIN HCL 500 MG PO TABS
1000.0000 mg | ORAL_TABLET | Freq: Two times a day (BID) | ORAL | 1 refills | Status: DC
Start: 2020-04-23 — End: 2020-10-23

## 2020-04-23 MED ORDER — FENOFIBRATE 145 MG PO TABS
145.0000 mg | ORAL_TABLET | Freq: Every day | ORAL | 1 refills | Status: DC
Start: 2020-04-23 — End: 2020-10-23

## 2020-04-23 MED ORDER — LOSARTAN POTASSIUM 50 MG PO TABS
50.0000 mg | ORAL_TABLET | Freq: Every day | ORAL | 1 refills | Status: DC
Start: 2020-04-23 — End: 2020-10-23

## 2020-04-23 MED ORDER — TRAZODONE HCL 50 MG PO TABS
50.0000 mg | ORAL_TABLET | Freq: Every day | ORAL | 1 refills | Status: DC
Start: 2020-04-23 — End: 2020-06-13

## 2020-04-23 MED ORDER — CLOPIDOGREL BISULFATE 75 MG PO TABS
75.0000 mg | ORAL_TABLET | Freq: Every day | ORAL | 1 refills | Status: DC
Start: 2020-04-23 — End: 2020-10-23

## 2020-04-23 NOTE — Assessment & Plan Note (Signed)
Under good control on current regimen. Continue current regimen. Continue to monitor. Call with any concerns. Refills given. Labs drawn today.   

## 2020-04-23 NOTE — Assessment & Plan Note (Signed)
Under good control with A1c of 6.3. Continue current regimen. Continue to monitor. Call with any concerns. Refills given.

## 2020-04-23 NOTE — Progress Notes (Signed)
BP 138/70 (BP Location: Left Arm, Patient Position: Sitting, Cuff Size: Normal)   Pulse (!) 51   Temp 97.9 F (36.6 C) (Oral)   Ht 5' 2.01" (1.575 m)   Wt 172 lb (78 kg)   SpO2 99%   BMI 31.45 kg/m    Subjective:    Patient ID: Susan Allen, female    DOB: 08/06/1940, 79 y.o.   MRN: [id number]  HPI: Susan Allen is a 79 y.o. female  Chief Complaint  Patient presents with  . Diabetes  . Hypertension  . Hyperlipidemia   Had an episode of hives about 2 weeks ago, not sure what made it come on.  DIABETES Hypoglycemic episodes:no Polydipsia: yes Polyuria: no Visual disturbance: no Chest pain: no Paresthesias: no Glucose Monitoring: yes  Accucheck frequency: Daily  Fasting glucose: highest has been 135 Taking Insulin?: no Blood Pressure Monitoring: not checking Retinal Examination: Up to Date Foot Exam: Done today Diabetic Education: Completed Pneumovax: Up to Date Influenza: Up to Date Aspirin: no  HYPERTENSION / HYPERLIPIDEMIA Satisfied with current treatment? yes Duration of hypertension: chronic BP monitoring frequency: not checking BP medication side effects: no Duration of hyperlipidemia: chronic Cholesterol medication side effects: yes- myalgias Cholesterol supplements: fish oil Past cholesterol medications: pravastatin, crestor Medication compliance: excellent compliance Aspirin: no Recent stressors: no Recurrent headaches: no Visual changes: no Palpitations: no Dyspnea: no Chest pain: no Lower extremity edema: no Dizzy/lightheaded: no  DEPRESSION Mood status: controlled Satisfied with current treatment?: yes Symptom severity: mild  Duration of current treatment : chronic Side effects: no Medication compliance: excellent compliance Psychotherapy/counseling: no  Previous psychiatric medications: paxil Depressed mood: no Anxious mood: no Anhedonia: no Significant weight loss or gain: no Insomnia: no  Fatigue: no Feelings of worthlessness  or guilt: no Impaired concentration/indecisiveness: no Suicidal ideations: no Hopelessness: no Crying spells: no Depression screen [facility] 2/9 02/02/2020 01/16/2020 10/05/2019 09/22/2019 04/25/2019  Decreased Interest 0 1 0 1 0  Down, Depressed, Hopeless 0 1 0 1 0  PHQ - 2 Score 0 2 0 2 0  Altered sleeping 0 0 0 3 2  Tired, decreased energy 0 0 0 3 0  Change in appetite 0 0 0 1 0  Feeling bad or failure about yourself  0 0 0 0 0  Trouble concentrating 0 0 0 0 0  Moving slowly or fidgety/restless 0 0 0 0 0  Suicidal thoughts 0 1 0 0 0  PHQ-9 Score 0 3 0 9 2  Difficult doing work/chores Not difficult at all Not difficult at all - Not difficult at all Not difficult at all    Relevant past medical, surgical, family and social history reviewed and updated as indicated. Interim medical history since our last visit reviewed. Allergies and medications reviewed and updated.  Review of Systems  Constitutional: Negative.   HENT: Negative.   Respiratory: Negative.   Cardiovascular: Negative.   Gastrointestinal: Negative.   Musculoskeletal: Negative.   Psychiatric/Behavioral: Negative.     Per HPI unless specifically indicated above     Objective:    BP 138/70 (BP Location: Left Arm, Patient Position: Sitting, Cuff Size: Normal)   Pulse (!) 51   Temp 97.9 F (36.6 C) (Oral)   Ht 5' 2.01" (1.575 m)   Wt 172 lb (78 kg)   SpO2 99%   BMI 31.45 kg/m   Wt Readings from Last 3 Encounters:  04/23/20 172 lb (78 kg)  02/02/20 174 lb (78.9 kg)  01/16/20 175 lb 3.2  oz (79.5 kg)    Physical Exam Vitals and nursing note reviewed.  Constitutional:      General: She is not in acute distress.    Appearance: Normal appearance. She is not ill-appearing, toxic-appearing or diaphoretic.  HENT:     Head: Normocephalic and atraumatic.     Right Ear: External ear normal.     Left Ear: External ear normal.     Nose: Nose normal.     Mouth/Throat:     Mouth: Mucous membranes are moist.     Pharynx:  Oropharynx is clear.  Eyes:     General: No scleral icterus.       Right eye: No discharge.        Left eye: No discharge.     Extraocular Movements: Extraocular movements intact.     Conjunctiva/sclera: Conjunctivae normal.     Pupils: Pupils are equal, round, and reactive to light.  Cardiovascular:     Rate and Rhythm: Normal rate and regular rhythm.     Pulses: Normal pulses.     Heart sounds: Normal heart sounds. No murmur heard.  No friction rub. No gallop.   Pulmonary:     Effort: Pulmonary effort is normal. No respiratory distress.     Breath sounds: Normal breath sounds. No stridor. No wheezing, rhonchi or rales.  Chest:     Chest wall: No tenderness.  Musculoskeletal:        General: Normal range of motion.     Cervical back: Normal range of motion and neck supple.  Skin:    General: Skin is warm and dry.     Capillary Refill: Capillary refill takes less than 2 seconds.     Coloration: Skin is not jaundiced or pale.     Findings: No bruising, erythema, lesion or rash.  Neurological:     General: No focal deficit present.     Mental Status: She is alert and oriented to person, place, and time. Mental status is at baseline.  Psychiatric:        Mood and Affect: Mood normal.        Behavior: Behavior normal.        Thought Content: Thought content normal.        Judgment: Judgment normal.     Results for orders placed or performed in visit on 01/16/20  Bayer DCA Hb A1c Waived  Result Value Ref Range   HB A1C (BAYER DCA - WAIVED) 6.1 <7.0 %  Hepatitis C Antibody  Result Value Ref Range   Hep C Virus Ab <0.1 0.0 - 0.9 s/co ratio      Assessment & Plan:   Problem List Items Addressed This Visit      Cardiovascular and Mediastinum   Essential hypertension    Under good control on current regimen. Continue current regimen. Continue to monitor. Call with any concerns. Refills given. Labs drawn today.       Relevant Medications   metoprolol tartrate (LOPRESSOR)  25 MG tablet   losartan (COZAAR) 50 MG tablet   isosorbide mononitrate (IMDUR) 30 MG 24 hr tablet   fenofibrate (TRICOR) 145 MG tablet   ezetimibe (ZETIA) 10 MG tablet   nitroGLYCERIN (NITROSTAT) 0.4 MG SL tablet   Other Relevant Orders   Comprehensive metabolic panel     Endocrine   Type 2 diabetes mellitus with hyperglycemia (HCC) - Primary    Under good control with A1c of 6.3. Continue current regimen. Continue to monitor. Call with any concerns.  Refills given.       Relevant Medications   metFORMIN (GLUCOPHAGE) 500 MG tablet   losartan (COZAAR) 50 MG tablet   Other Relevant Orders   Comprehensive metabolic panel   Bayer DCA Hb A1c Waived     Other   Hyperlipidemia    Under good control on current regimen. Continue current regimen. Continue to monitor. Call with any concerns. Refills given. Labs drawn today.       Relevant Medications   metoprolol tartrate (LOPRESSOR) 25 MG tablet   losartan (COZAAR) 50 MG tablet   isosorbide mononitrate (IMDUR) 30 MG 24 hr tablet   fenofibrate (TRICOR) 145 MG tablet   ezetimibe (ZETIA) 10 MG tablet   nitroGLYCERIN (NITROSTAT) 0.4 MG SL tablet   Other Relevant Orders   Comprehensive metabolic panel   Lipid Panel w/o Chol/HDL Ratio   Depression, recurrent (HCC)    Under good control on current regimen. Continue current regimen. Continue to monitor. Call with any concerns. Refills given. Labs drawn today.       Relevant Medications   traZODone (DESYREL) 50 MG tablet   PARoxetine (PAXIL) 20 MG tablet    Other Visit Diagnoses    Family history of colon cancer       Overdue for colonoscopy. Requests referral. Referral placed today.    Relevant Orders   Ambulatory referral to Gastroenterology   Hives       Unclear cause. Will get her into allergy at her request for testing. Call with any concerns.    Relevant Orders   Ambulatory referral to Allergy   Need for influenza vaccination       Flu shot given today.    Relevant Orders     Flu Vaccine QUAD High Dose(Fluad) (Completed)       Follow up plan: Return in about 6 months (around 10/21/2020) for physical.

## 2020-04-24 LAB — COMPREHENSIVE METABOLIC PANEL
ALT: 7 IU/L (ref 0–32)
AST: 13 IU/L (ref 0–40)
Albumin/Globulin Ratio: 1.9 (ref 1.2–2.2)
Albumin: 3.8 g/dL (ref 3.7–4.7)
Alkaline Phosphatase: 36 IU/L — ABNORMAL LOW (ref 44–121)
BUN/Creatinine Ratio: 18 (ref 12–28)
BUN: 15 mg/dL (ref 8–27)
Bilirubin Total: <0.2 mg/dL (ref 0.0–1.2)
CO2: 27 mmol/L (ref 20–29)
Calcium: 9.3 mg/dL (ref 8.7–10.3)
Chloride: 103 mmol/L (ref 96–106)
Creatinine, Ser: 0.82 mg/dL (ref 0.57–1.00)
GFR calc Af Amer: 79 mL/min/{1.73_m2} (ref >59)
GFR calc non Af Amer: 69 mL/min/{1.73_m2} (ref >59)
Globulin, Total: 2 g/dL (ref 1.5–4.5)
Glucose: 123 mg/dL — ABNORMAL HIGH (ref 65–99)
Potassium: 4.5 mmol/L (ref 3.5–5.2)
Sodium: 142 mmol/L (ref 134–144)
Total Protein: 5.8 g/dL — ABNORMAL LOW (ref 6.0–8.5)

## 2020-04-24 LAB — LIPID PANEL W/O CHOL/HDL RATIO
Cholesterol, Total: 193 mg/dL (ref 100–199)
HDL: 52 mg/dL (ref >39)
LDL Chol Calc (NIH): 123 mg/dL — ABNORMAL HIGH (ref 0–99)
Triglycerides: 99 mg/dL (ref 0–149)
VLDL Cholesterol Cal: 18 mg/dL (ref 5–40)

## 2020-04-29 ENCOUNTER — Encounter: Payer: Self-pay | Admitting: Family Medicine

## 2020-06-05 DIAGNOSIS — Z955 Presence of coronary angioplasty implant and graft: Secondary | ICD-10-CM | POA: Diagnosis not present

## 2020-06-05 DIAGNOSIS — I25118 Atherosclerotic heart disease of native coronary artery with other forms of angina pectoris: Secondary | ICD-10-CM | POA: Diagnosis not present

## 2020-06-05 DIAGNOSIS — E118 Type 2 diabetes mellitus with unspecified complications: Secondary | ICD-10-CM | POA: Diagnosis not present

## 2020-06-05 DIAGNOSIS — I2510 Atherosclerotic heart disease of native coronary artery without angina pectoris: Secondary | ICD-10-CM | POA: Diagnosis not present

## 2020-06-05 DIAGNOSIS — I6522 Occlusion and stenosis of left carotid artery: Secondary | ICD-10-CM | POA: Diagnosis not present

## 2020-06-05 DIAGNOSIS — I10 Essential (primary) hypertension: Secondary | ICD-10-CM | POA: Diagnosis not present

## 2020-06-05 DIAGNOSIS — E782 Mixed hyperlipidemia: Secondary | ICD-10-CM | POA: Diagnosis not present

## 2020-06-11 ENCOUNTER — Other Ambulatory Visit: Payer: Self-pay | Admitting: Family Medicine

## 2020-06-12 ENCOUNTER — Other Ambulatory Visit: Payer: Self-pay | Admitting: Family Medicine

## 2020-06-17 ENCOUNTER — Telehealth: Payer: Self-pay | Admitting: Family Medicine

## 2020-06-17 NOTE — Telephone Encounter (Signed)
Order placed in folder for signature.  

## 2020-06-17 NOTE — Telephone Encounter (Signed)
Pt has new insurance this year and they no longer cover accu check.  Pt needs new Rx for  One touch ultra glucometer Test strips  and  lancets  [id number] -  (N), [place]

## 2020-06-24 ENCOUNTER — Ambulatory Visit: Payer: PPO | Admitting: Gastroenterology

## 2020-08-06 ENCOUNTER — Telehealth: Payer: Self-pay

## 2020-08-06 NOTE — Telephone Encounter (Unsigned)
Copied from [place] [id number]. Topic: General - Other >> Aug 06, 2020  3:01 PM Mcneil, Ja-Kwan wrote: Reason for CRM: Pt stated that she has changed insurance and it costs $77 for a 30 day supply of ezetimibe (ZETIA) 10 MG tablet. Pt stated it also causes her cramps so she prefers to switch back to pravastatin (PRAVACHOL) 40 MG tablet.

## 2020-08-06 NOTE — Telephone Encounter (Signed)
Please advise 

## 2020-08-07 MED ORDER — PRAVASTATIN SODIUM 40 MG PO TABS: 40.0000 mg | ORAL_TABLET | Freq: Every day | ORAL | 1 refills | Status: DC

## 2020-08-07 NOTE — Telephone Encounter (Signed)
Patient's chart states that she also had leg cramps with the Pravastatin.  Are they less severe with the Pravastatin that she is okay with switching back?

## 2020-08-07 NOTE — Telephone Encounter (Signed)
Patient states she is okay with switching back to Pravastatin as she states she does get legs cramps, but she did mention that taking her Co-Q 10 in the morning and taking the Pravastatin at night helps a lot.

## 2020-08-07 NOTE — Telephone Encounter (Signed)
Medication sent to the pharmacy for patient. ?

## 2020-09-02 ENCOUNTER — Other Ambulatory Visit: Payer: Self-pay | Admitting: Family Medicine

## 2020-09-02 NOTE — Telephone Encounter (Signed)
Cpe scheduled 6/1 per last ov 03/2020

## 2020-09-02 NOTE — Telephone Encounter (Signed)
  Notes to clinic:  Supplies were last order by a historical provider  Review for continued use and new script    Requested Prescriptions  Pending Prescriptions Disp Refills   Blood Glucose Monitoring Suppl (FREESTYLE LITE) DEVI      Sig: USE TO CHECK GLUCOSE ONCE DAILY      Endocrinology: Diabetes - Testing Supplies Passed - 09/02/2020  2:21 PM      Passed - Valid encounter within last 12 months    Recent Outpatient Visits           4 months ago Type 2 diabetes mellitus with hyperglycemia, without long-term current use of insulin ([place])   [facility], Megan P, DO   7 months ago Type 2 diabetes mellitus with hyperglycemia, without long-term current use of insulin ([place])   [place], Megan P, DO   10 months ago Visit for suture removal   [place], Megan P, DO   10 months ago Sebaceous cyst   [place], Megan P, DO   11 months ago Type 2 diabetes mellitus with hyperglycemia, without long-term current use of insulin ([place])   [place], [place], DO       Future Appointments             In 1 month Johnson, Barb Merino, DO [place], [place]   In 5 months  [employer], [facility]

## 2020-09-02 NOTE — Telephone Encounter (Signed)
Medication Refill - Medication: Diabetic testing supplies   Has the patient contacted their pharmacy? Yes.   (Agent: If no, request that the patient contact the pharmacy for the refill.) (Agent: If yes, when and what did the pharmacy advise?)  Preferred Pharmacy (with phone number or street name):  [facility] - [place], [address] Phone:  [phone]  Fax:  [phone]       Agent: Please be advised that RX refills may take up to 3 business days. We ask that you follow-up with your pharmacy.

## 2020-09-02 NOTE — Telephone Encounter (Signed)
Routing to provider  

## 2020-09-03 MED ORDER — FREESTYLE LITE DEVI: 12 refills | Status: DC

## 2020-09-15 ENCOUNTER — Other Ambulatory Visit: Payer: Self-pay

## 2020-09-15 ENCOUNTER — Encounter: Payer: Self-pay | Admitting: Emergency Medicine

## 2020-09-15 ENCOUNTER — Emergency Department
Admission: EM | Admit: 2020-09-15 | Discharge: 2020-09-15 | Disposition: A | Discharge status: Home / Self Care | Payer: Medicare HMO | Attending: Emergency Medicine | Admitting: Emergency Medicine

## 2020-09-15 DIAGNOSIS — E1169 Type 2 diabetes mellitus with other specified complication: Secondary | ICD-10-CM | POA: Diagnosis not present

## 2020-09-15 DIAGNOSIS — Z743 Need for continuous supervision: Secondary | ICD-10-CM | POA: Diagnosis not present

## 2020-09-15 DIAGNOSIS — Z7984 Long term (current) use of oral hypoglycemic drugs: Secondary | ICD-10-CM | POA: Diagnosis not present

## 2020-09-15 DIAGNOSIS — Z96653 Presence of artificial knee joint, bilateral: Secondary | ICD-10-CM | POA: Diagnosis not present

## 2020-09-15 DIAGNOSIS — T7840XA Allergy, unspecified, initial encounter: Secondary | ICD-10-CM | POA: Diagnosis not present

## 2020-09-15 DIAGNOSIS — I10 Essential (primary) hypertension: Secondary | ICD-10-CM | POA: Diagnosis not present

## 2020-09-15 DIAGNOSIS — Z7902 Long term (current) use of antithrombotics/antiplatelets: Secondary | ICD-10-CM | POA: Diagnosis not present

## 2020-09-15 DIAGNOSIS — R21 Rash and other nonspecific skin eruption: Secondary | ICD-10-CM | POA: Diagnosis not present

## 2020-09-15 DIAGNOSIS — Z79899 Other long term (current) drug therapy: Secondary | ICD-10-CM | POA: Insufficient documentation

## 2020-09-15 DIAGNOSIS — Z955 Presence of coronary angioplasty implant and graft: Secondary | ICD-10-CM | POA: Insufficient documentation

## 2020-09-15 DIAGNOSIS — L299 Pruritus, unspecified: Secondary | ICD-10-CM | POA: Insufficient documentation

## 2020-09-15 DIAGNOSIS — E1165 Type 2 diabetes mellitus with hyperglycemia: Secondary | ICD-10-CM | POA: Diagnosis not present

## 2020-09-15 DIAGNOSIS — R0902 Hypoxemia: Secondary | ICD-10-CM | POA: Diagnosis not present

## 2020-09-15 DIAGNOSIS — R55 Syncope and collapse: Secondary | ICD-10-CM | POA: Diagnosis not present

## 2020-09-15 DIAGNOSIS — E785 Hyperlipidemia, unspecified: Secondary | ICD-10-CM | POA: Diagnosis not present

## 2020-09-15 DIAGNOSIS — I25110 Atherosclerotic heart disease of native coronary artery with unstable angina pectoris: Secondary | ICD-10-CM | POA: Diagnosis not present

## 2020-09-15 LAB — BASIC METABOLIC PANEL
Anion gap: 8 (ref 5–15)
BUN: 21 mg/dL (ref 8–23)
CO2: 25 mmol/L (ref 22–32)
Calcium: 8.5 mg/dL — ABNORMAL LOW (ref 8.9–10.3)
Chloride: 99 mmol/L (ref 98–111)
Creatinine, Ser: 0.93 mg/dL (ref 0.44–1.00)
GFR, Estimated: >60 mL/min (ref >60)
Glucose, Bld: 173 mg/dL — ABNORMAL HIGH (ref 70–99)
Potassium: 3.5 mmol/L (ref 3.5–5.1)
Sodium: 132 mmol/L — ABNORMAL LOW (ref 135–145)

## 2020-09-15 LAB — CBC
HCT: 38 % (ref 36.0–46.0)
Hemoglobin: 12.6 g/dL (ref 12.0–15.0)
MCH: 28.3 pg (ref 26.0–34.0)
MCHC: 33.2 g/dL (ref 30.0–36.0)
MCV: 85.2 fL (ref 80.0–100.0)
Platelets: 297 10*3/uL (ref 150–400)
RBC: 4.46 MIL/uL (ref 3.87–5.11)
RDW: 13.2 % (ref 11.5–15.5)
WBC: 10 10*3/uL (ref 4.0–10.5)
nRBC: 0 % (ref 0.0–0.2)

## 2020-09-15 LAB — TROPONIN I (HIGH SENSITIVITY): Troponin I (High Sensitivity): 10 ng/L (ref <18)

## 2020-09-15 MED ORDER — PREDNISONE 50 MG PO TABS: 50.0000 mg | ORAL_TABLET | Freq: Every day | ORAL | 0 refills | Status: AC

## 2020-09-15 MED ORDER — METHYLPREDNISOLONE SODIUM SUCC 125 MG IJ SOLR
125.0000 mg | Freq: Once | INTRAMUSCULAR | Status: AC
  Administered 2020-09-15: 125 mg via INTRAVENOUS
  Filled 2020-09-15: qty 2

## 2020-09-15 MED ORDER — FAMOTIDINE IN NACL 20-0.9 MG/50ML-% IV SOLN
20.0000 mg | Freq: Once | INTRAVENOUS | Status: AC
  Administered 2020-09-15: 20 mg via INTRAVENOUS
  Filled 2020-09-15: qty 50

## 2020-09-15 MED ORDER — EPINEPHRINE 0.3 MG/0.3ML IJ SOAJ: 0.3000 mg | Freq: Once | INTRAMUSCULAR | 1 refills | Status: AC

## 2020-09-15 NOTE — ED Provider Notes (Signed)
[facility] Emergency Department Provider Note  ____________________________________________   Event Date/Time   First MD Initiated Contact with Patient 09/15/20 [phone]     (approximate)  I have reviewed the triage vital signs and the nursing notes.   HISTORY  Chief Complaint Allergic Reaction    HPI Susan Allen is a 80 y.o. female with medical history as listed below who also reports that she has allergic reaction several times a year but does not know why.  She presents by EMS after having a presumed allergic reaction and passing out.  She said that she ate some meat in the early afternoon and had no problems until about 6 hours later when she started feeling an itching rash across her chest just below her bra.  She was scratching and saw that she had some hives and then it spread to along her panty line.  She has some generalized hives and rash and she took  a dose of Benadryl.  She took another dose of Benadryl about 2 hours later.  When she woke up during the night she felt little bit better from itching but then she got up to walk to the bathroom and passed out.  Per EMS her blood pressure was low (60/30) but after 5 mL of normal saline her blood pressure was up at around 160/69 and it was similar when she arrived in the ED.  She says she feels better now.  She has a little bit of itching but the hives have gone away.  She had no abdominal pain.  She was feeling like she needed to have diarrhea which is why she was going to the bathroom.  No vomiting.  She does not feel lightheaded or dizzy.  She has had no chest pain or shortness of breath.  No sensation of throat swelling or closing.  She denies fever/chills.  The onset of the itching and rash was gradual and it has occurred about 6 hours after she ate some red meat.  The onset of the syncopal episode was acute and severe.  Nothing in particular seems to have made the symptoms better or worse.        Past  Medical History:  Diagnosis Date  . Allergy   . Arthritis   . Bronchitis   . Coronary artery disease   . Depression   . Diabetes mellitus without complication ([place])   . GERD (gastroesophageal reflux disease)   . Headache   . Hypertension   . Myocardial infarction [facility]) June 2014, July 2014    Patient Active Problem List   Diagnosis Date Noted  . Moderate episode of recurrent major depressive disorder ([place]) 11/30/2019  . Type 2 diabetes mellitus with complication, without long-term current use of insulin ([place]) 11/30/2019  . Chest pain 09/25/2019  . Depression, recurrent ([place]) 06/01/2018  . Carotid stenosis 10/12/2016  . Essential hypertension 10/12/2016  . Hyperlipidemia 10/12/2016  . Type 2 diabetes mellitus with hyperglycemia ([place]) 10/12/2016  . Coronary artery disease of native artery of native heart with stable angina pectoris ([place]) 10/12/2016  . Encounter for general adult medical examination without abnormal findings 09/04/2016  . Vaccine counseling 09/04/2016  . Primary insomnia 10/25/2014  . 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 Barrett's esophagus 12/20/2012  . Hallux valgus with bunions 02/09/2012  . Hammertoe 02/09/2012  . Neuroma, Morton's 02/09/2012    Past Surgical History:  Procedure Laterality Date  . ABDOMINAL HYSTERECTOMY    .  APPENDECTOMY    . CORONARY ANGIOPLASTY     with stents   . DIGIT NAIL REMOVAL Bilateral 08/30/2015   Procedure: REMOVAL OF DIGIT NAILS/ BIL. PERM.REMOVAL INGROWN GREAT TOE NAILS;  Surgeon: Albertine Patricia, DPM;  Location: [facility] ORS;  Service: Podiatry;  Laterality: Bilateral;  . 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 JOINT REPLACEMENT Bilateral    Total Knee Replacement, Dr Burnis Kingfisher, [place]      Prior to Admission  medications   Medication Sig Start Date End Date Taking? Authorizing Provider  alum & mag hydroxide-simeth (MYLANTA) 200-200-20 MG/5ML suspension Take 15 mLs by mouth every 6 (six) hours as needed for indigestion or heartburn.    [provider]  Blood Glucose Monitoring Suppl (FREESTYLE LITE) DEVI USE TO CHECK GLUCOSE ONCE DAILY 09/03/20   Wynetta Emery, Megan P, DO  cetirizine (ZYRTEC) 10 MG tablet Take 10 mg by mouth as needed for allergies.    [provider]  clopidogrel (PLAVIX) 75 MG tablet Take 1 tablet (75 mg total) by mouth daily. 04/23/20   Johnson, Megan P, DO  Coenzyme Q10 100 MG capsule Take 1 capsule by mouth daily.    [provider]  diclofenac sodium (VOLTAREN) 1 % GEL Apply 4 g topically 4 (four) times daily. 01/20/19   Johnson, Megan P, DO  diphenhydrAMINE (BENADRYL) 25 MG tablet Take 25 mg by mouth as needed.    [provider]  fenofibrate (TRICOR) 145 MG tablet Take 1 tablet (145 mg total) by mouth at bedtime. 04/23/20   Valerie Roys, DO  FREESTYLE LITE test strip SMARTSIG:Strip(s) Via Meter Daily 01/18/20   [provider]  isosorbide mononitrate (IMDUR) 30 MG 24 hr tablet Take 1 tablet (30 mg total) by mouth daily. 04/23/20   Johnson, Megan P, DO  losartan (COZAAR) 50 MG tablet Take 1 tablet (50 mg total) by mouth daily. 04/23/20   Johnson, Megan P, DO  metFORMIN (GLUCOPHAGE) 500 MG tablet Take 2 tablets (1,000 mg total) by mouth 2 (two) times daily with a meal. 04/23/20   Johnson, Megan P, DO  metoprolol tartrate (LOPRESSOR) 25 MG tablet Take 1.5 tablets (37.5 mg total) by mouth 2 (two) times daily. 04/23/20   Johnson, Megan P, DO  nitroGLYCERIN (NITROSTAT) 0.4 MG SL tablet Place 1 tablet (0.4 mg total) under the tongue every 5 (five) minutes as needed for chest pain. 04/23/20   Johnson, Megan P, DO  omeprazole (PRILOSEC) 20 MG capsule Take 1 capsule (20 mg total) by mouth daily. 10/05/19   Johnson, Megan P, DO  PARoxetine (PAXIL) 20  MG tablet Take 2 tablets (40 mg total) by mouth at bedtime. 04/23/20   Johnson, Megan P, DO  pravastatin (PRAVACHOL) 40 MG tablet Take 1 tablet (40 mg total) by mouth daily. 08/07/20   Jon Billings, NP  Simethicone (GAS-X PO) Take by mouth.    [provider]  traZODone (DESYREL) 50 MG tablet TAKE 1 TABLET BY MOUTH AT BEDTIME 06/13/20   Johnson, Megan P, DO  triamcinolone cream (KENALOG) 0.1 % Apply 1 application topically 2 (two) times daily. 01/20/19   Park Liter P, DO    Allergies Aspirin, Lisinopril, and Statins  Family History  Problem Relation Age of Onset  . Colon cancer Mother 25  . Cancer Mother        colon  .  Cancer Father        lung  . Cancer Sister        ovarian  . Breast cancer Neg Hx     Social History Social History   Tobacco Use  . Smoking status: Former Smoker    Packs/day: 0.25    Types: Cigarettes    Quit date: 02/27/2004    Years since quitting: 16.5  . Smokeless tobacco: Never Used  Vaping Use  . Vaping Use: Former  Substance Use Topics  . Alcohol use: No  . Drug use: No    Review of Systems Constitutional: No fever/chills Eyes: No visual changes. ENT: No sore throat. Cardiovascular: Syncope.  Denies chest pain. Respiratory: Denies shortness of breath. Gastrointestinal: No abdominal pain.  No nausea, no vomiting.  Positive for diarrhea.  No constipation. Genitourinary: Negative for dysuria. Musculoskeletal: Negative for neck pain.  Negative for back pain. Integumentary: Positive for itchy red rash, now improved after Benadryl. Neurological: Negative for headaches, focal weakness or numbness.   ____________________________________________   PHYSICAL EXAM:  VITAL SIGNS: ED Triage Vitals  Enc Vitals Group     BP 09/15/20 0147 (!) 148/50     Pulse Rate 09/15/20 0147 (!) 56     Resp 09/15/20 0147 20     Temp 09/15/20 0147 98.4 F (36.9 C)     Temp Source 09/15/20 0147 Oral     SpO2 09/15/20 0147 100 %     Weight  09/15/20 0148 77.1 kg (170 lb)     Height 09/15/20 0148 1.575 m (5' 2")     Head Circumference --      Peak Flow --      Pain Score 09/15/20 0148 0     Pain Loc --      Pain Edu? --      Excl. in [place]? --     Constitutional: Alert and oriented.  Eyes: Conjunctivae are normal.  Head: Atraumatic. Nose: No congestion/rhinnorhea. Mouth/Throat: Patient is wearing a mask. Neck: No stridor.  No meningeal signs.   Cardiovascular: Normal rate, regular rhythm. Good peripheral circulation. Respiratory: Normal respiratory effort.  No retractions. Gastrointestinal: Soft and nontender. No distention.  Musculoskeletal: No lower extremity tenderness nor edema. No gross deformities of extremities. Neurologic:  Normal speech and language. No gross focal neurologic deficits are appreciated.  Skin:  Skin is warm, dry and intact.  I do not appreciate any urticaria at this time. Psychiatric: Mood and affect are normal. Speech and behavior are normal.  ____________________________________________   LABS (all labs ordered are listed, but only abnormal results are displayed)  Labs Reviewed  BASIC METABOLIC PANEL - Abnormal; Notable for the following components:      Result Value   Sodium 132 (*)    Glucose, Bld 173 (*)    Calcium 8.5 (*)    All other components within normal limits  CBC  URINALYSIS, COMPLETE (UACMP) WITH MICROSCOPIC  TROPONIN I (HIGH SENSITIVITY)   ____________________________________________  EKG  ED ECG REPORT I, Hinda Kehr, the attending physician, personally viewed and interpreted this ECG.  Date: 09/15/2020 EKG Time: 1:40 AM Rate: 57 Rhythm: Sinus bradycardia QRS Axis: normal Intervals: Left bundle branch block ST/T Wave abnormalities: Non-specific ST segment / T-wave changes, but no clear evidence of acute ischemia. Narrative Interpretation: no definitive evidence of acute ischemia; does not meet STEMI  criteria.   ____________________________________________   .1-3 Lead EKG Interpretation Performed by: Hinda Kehr, MD Authorized by: Hinda Kehr, MD  Interpretation: normal     ECG rate:  60   ECG rate assessment: normal     Rhythm: sinus rhythm     Ectopy: none     Conduction: normal       ____________________________________________   INITIAL IMPRESSION / MDM / ASSESSMENT AND PLAN / ED COURSE  As part of my medical decision making, I reviewed the following data within the electronic medical record:  History obtained from family, Nursing notes reviewed and incorporated, Labs reviewed , EKG interpreted , Old chart reviewed and Notes from prior ED visits   Differential diagnosis includes, but is not limited to, allergic reaction/anaphylaxis including the possibility of alpha gal, cardiogenic syncope such as any 1 of a number of arrhythmias, electrolyte or metabolic abnormality, dehydration, vasovagal episode, orthostatic syncope.  The patient is on the cardiac monitor to evaluate for evidence of arrhythmia and/or significant heart rate changes.  Patient is feeling better.  She may have met criteria earlier for anaphylaxis particular with a hypertension of the diarrhea, but she is feeling better now and she is normotensive.  No indication for EpiPen.  I will give her Solu-Medrol 125 mg IV and famotidine 20 mg IV.  I warned her that with the Solu-Medrol and the probable prednisone prescription I will give her when she is able to be discharged that she can expect her blood glucose level to be elevated for a while, and she understands.  The patient has a substantial cardiac history as well.  She is not having any chest pain or shortness of breath but I will check basic lab work and her troponin.  No evidence of ischemia on EKG.  Patient is comfortable with the plan for observation while lab work is pending and as per atypical allergic reaction/anaphylaxis protocol.     Clinical  Course as of 09/15/20 0737  Sun Sep 15, 2020  0314 Very mild hyponatremia, likely not clinically significant, normal high-sensitivity troponin of 10.  CBC pending.  [CF]  0732 Patient has been stable overnight.  She says she has no more symptoms.  She is comfortable with the plan for discharge and outpatient follow-up.  She will talk with her primary care doctor about going to an allergist to discuss what could have happened and also to explore the possibility of alpha gal.  I gave my usual and customary return precautions and prescriptions as listed below.  [CF]    Clinical Course User Index [CF] Forbach, Cory, MD     ____________________________________________  FINAL CLINICAL IMPRESSION(S) / ED DIAGNOSES  Final diagnoses:  None     MEDICATIONS GIVEN DURING THIS VISIT:  Medications  methylPREDNISolone sodium succinate (SOLU-MEDROL) 125 mg/2 mL injection 125 mg (125 mg Intravenous Given 09/15/20 0214)  famotidine (PEPCID) IVPB 20 mg premix (20 mg Intravenous New Bag/Given 09/15/20 0215)     ED Discharge Orders    None      *Please note:  Susan Allen was evaluated in Emergency Department on 09/15/2020 for the symptoms described in the history of present illness. She was evaluated in the context of the global COVID-19 pandemic, which necessitated consideration that the patient might be at risk for infection with the SARS-CoV-2 virus that causes COVID-19. Institutional protocols and algorithms that pertain to the evaluation of patients at risk for COVID-19 are in a state of rapid change based on information released by regulatory bodies including the CDC and federal and state organizations. These policies and algorithms were followed during the patient's care in   the ED.  Some ED evaluations and interventions may be delayed as a result of limited staffing during and after the pandemic.*  Note:  This document was prepared using Dragon voice recognition software and may include  unintentional dictation errors.   Hinda Kehr, MD 09/15/20 [phone]

## 2020-09-15 NOTE — ED Triage Notes (Signed)
Pt presents to ER from home via EMS with complaints of syncopal episode, per EMS pt has history of spontaneous allergic reactions, reports yesterday evening pt started to feel itchy and had some hives reports pt took one benadryl around 7pm and another one at 9pm reports woke up in the middle of night and had a syncopal episode per EMS pt's BP 60/30 after 567ml bolus BP 160/69. Pt is awake alert and oriented no distress noted.

## 2020-09-15 NOTE — Discharge Instructions (Signed)
As we discussed, we believe your episode of passing out was due to an allergic reaction.  We observed you for 6 hours in the emergency department and you remained asymptomatic and stable.  Your lab work and EKG were reassuring.  However we recommend you follow-up with your regular doctor for additional evaluation and to discuss whether or not you would benefit from seeing an allergist.  In particular, you have had tick exposures and you had what appears to be an allergic reaction about 6 hours after eating red meat.  You may wish to discuss the possibility of what is called an "alpha-gal" allergy.    In the meantime, please avoid red meat.  Take the prednisone as prescribed and keep the EpiPen near you in case you have recurrent severe allergic reaction.  Remember that if you use the EpiPen, you should still come immediately to the emergency department or call 911.  Be sure to drink plenty of fluids and take your regular medications return to the emergency department if you develop new or worsening symptoms that concern you.

## 2020-09-15 NOTE — ED Notes (Signed)
Pt's spouse reports pt lost consciousness for about 20 seconds and hit her head.

## 2020-09-18 ENCOUNTER — Other Ambulatory Visit: Payer: Self-pay | Admitting: Family Medicine

## 2020-09-18 NOTE — Telephone Encounter (Signed)
Routing to provider  

## 2020-09-18 NOTE — Telephone Encounter (Signed)
Scheduled 6/1

## 2020-09-18 NOTE — Telephone Encounter (Signed)
  Notes to clinic:  this script has expired  Review for continued use and refill    Requested Prescriptions  Pending Prescriptions Disp Refills   triamcinolone cream (KENALOG) 0.1 % [Pharmacy Med Name: Triamcinolone Acetonide 0.1 % External Cream] 90 g 0    Sig: APPLY TO [place]      Dermatology:  Corticosteroids Passed - 09/18/2020  9:57 AM      Passed - Valid encounter within last 12 months    Recent Outpatient Visits           4 months ago Type 2 diabetes mellitus with hyperglycemia, without long-term current use of insulin ([place])   [facility], Megan P, DO   8 months ago Type 2 diabetes mellitus with hyperglycemia, without long-term current use of insulin ([place])   [place], Megan P, DO   10 months ago Visit for suture removal   [place], Megan P, DO   11 months ago Sebaceous cyst   [place], Megan P, DO   11 months ago Type 2 diabetes mellitus with hyperglycemia, without long-term current use of insulin ([place])   [place], [place], DO       Future Appointments             In 1 month Johnson, Barb Merino, DO [place], [facility]   In 4 months  [facility], [facility]

## 2020-09-18 NOTE — Telephone Encounter (Signed)
  Notes to clinic: script has expired  Review for continued use and refill    Requested Prescriptions  Pending Prescriptions Disp Refills   diclofenac Sodium (VOLTAREN) 1 % GEL [Pharmacy Med Name: Diclofenac Sodium 1 % Transdermal Gel] 100 g 0    Sig: APPLY 4 GRAMS TOPICALLY 4 TIMES DAILY      Analgesics:  Topicals Passed - 09/18/2020  9:59 AM      Passed - Valid encounter within last 12 months    Recent Outpatient Visits           4 months ago Type 2 diabetes mellitus with hyperglycemia, without long-term current use of insulin ([place])   [facility], Megan P, DO   8 months ago Type 2 diabetes mellitus with hyperglycemia, without long-term current use of insulin ([place])   [place], Megan P, DO   10 months ago Visit for suture removal   [place], Megan P, DO   11 months ago Sebaceous cyst   [place], Megan P, DO   11 months ago Type 2 diabetes mellitus with hyperglycemia, without long-term current use of insulin ([place])   [place], [place], DO       Future Appointments             In 1 month Johnson, Barb Merino, DO [place], [place]   In 4 months  [facility], [facility]

## 2020-09-23 ENCOUNTER — Telehealth: Payer: Self-pay

## 2020-09-23 NOTE — Telephone Encounter (Signed)
PA for diclofenac sodium 1% gel has been approved.

## 2020-09-23 NOTE — Telephone Encounter (Signed)
PA initiated for Diclofenac Sodium 1% Gel via covermymeds.  KEY: [id number]

## 2020-09-24 ENCOUNTER — Telehealth: Payer: Self-pay

## 2020-09-24 ENCOUNTER — Encounter: Payer: Self-pay | Admitting: Family Medicine

## 2020-09-24 ENCOUNTER — Ambulatory Visit (INDEPENDENT_AMBULATORY_CARE_PROVIDER_SITE_OTHER): Payer: Medicare HMO | Admitting: Family Medicine

## 2020-09-24 ENCOUNTER — Other Ambulatory Visit: Payer: Self-pay

## 2020-09-24 VITALS — BP 111/58 | HR 59 | Temp 97.9°F | Wt 162.4 lb

## 2020-09-24 DIAGNOSIS — T7840XD Allergy, unspecified, subsequent encounter: Secondary | ICD-10-CM

## 2020-09-24 DIAGNOSIS — I10 Essential (primary) hypertension: Secondary | ICD-10-CM

## 2020-09-24 DIAGNOSIS — K121 Other forms of stomatitis: Secondary | ICD-10-CM

## 2020-09-24 DIAGNOSIS — R5383 Other fatigue: Secondary | ICD-10-CM

## 2020-09-24 DIAGNOSIS — W57XXXD Bitten or stung by nonvenomous insect and other nonvenomous arthropods, subsequent encounter: Secondary | ICD-10-CM | POA: Diagnosis not present

## 2020-09-24 DIAGNOSIS — E782 Mixed hyperlipidemia: Secondary | ICD-10-CM | POA: Diagnosis not present

## 2020-09-24 DIAGNOSIS — E1165 Type 2 diabetes mellitus with hyperglycemia: Secondary | ICD-10-CM | POA: Diagnosis not present

## 2020-09-24 LAB — MICROALBUMIN, URINE WAIVED
Creatinine, Urine Waived: 300 mg/dL (ref 10–300)
Microalb, Ur Waived: 30 mg/L — ABNORMAL HIGH (ref 0–19)
Microalb/Creat Ratio: <30 mg/g (ref <30)

## 2020-09-24 LAB — BAYER DCA HB A1C WAIVED: HB A1C (BAYER DCA - WAIVED): 6.2 % (ref <7.0)

## 2020-09-24 MED ORDER — VALACYCLOVIR HCL 1 G PO TABS: 1000.0000 mg | ORAL_TABLET | Freq: Two times a day (BID) | ORAL | 0 refills | Status: AC

## 2020-09-24 MED ORDER — DOXYCYCLINE HYCLATE 100 MG PO TABS: 100.0000 mg | ORAL_TABLET | Freq: Two times a day (BID) | ORAL | 0 refills | Status: DC

## 2020-09-24 MED ORDER — LIDOCAINE VISCOUS HCL 2 % MT SOLN
15.0000 mL | OROMUCOSAL | 0 refills | Status: DC | PRN
Start: 2020-09-24 — End: 2021-11-04

## 2020-09-24 NOTE — Telephone Encounter (Signed)
Error

## 2020-09-24 NOTE — Progress Notes (Signed)
BP (!) 111/58   Pulse (!) 59   Temp 97.9 F (36.6 C)   Wt 162 lb 6.4 oz (73.7 kg)   SpO2 96%   BMI 29.70 kg/m    Subjective:    Patient ID: Susan Allen, female    DOB: Mar 12, 1941, 80 y.o.   MRN: [id number]  HPI: Susan Allen is a 80 y.o. female  Chief Complaint  Patient presents with  . Hospitalization Follow-up    Patient states she had an allergic reaction, and passed out 09/15/20. 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 Patient removed a tick off herself about 3 weeks ago.   Haywood Lasso    Patient states she has blisters around her mouth and inside her mouth, noticed them about 3 days after she had her allergic reaction. Patient states she is having trouble eating. Patient states there is one on her eyelid as well.   . medication concern    Patient states her cardiologist prescribes Isosorbide 60mg  , so she has been taking 90 mg daily.    ER FOLLOW UP- went to the ER after eating meat and passing out with a low BP and developing hives. There was a question of anaphylaxis, but she did not need an epipen. She did get solumedrol and famotidine, she went home with prednisone. She had had a tick bite about 3 weeks prior and there was a concern for alpha-gal. She had never had an issue with this previously. Time since discharge: 9 days Hospital/facility: [facility] Diagnosis: Allergic reaction, syncope  Procedures/tests: labs Consultants: none New medications: prednisone, epi-pen Discharge instructions:  Follow up here and with allergist Status: better Has been feeling really tired since she got out of the hospital. She's doing OK, but not great  HYPERTENSION / HYPERLIPIDEMIA- has been taking 90mg  of her imdur Satisfied with current treatment? no Duration of hypertension: chronic BP monitoring frequency: not checking BP medication side effects: no Duration of hyperlipidemia: chronic Cholesterol medication side effects: no Cholesterol supplements: none Past  cholesterol medications: fenofibrate, pravastatin Medication compliance: excellent compliance Aspirin: yes Recent stressors: no Recurrent headaches: no Visual changes: no Palpitations: no Dyspnea: no Chest pain: no Lower extremity edema: no Dizzy/lightheaded: yes  DIABETES Hypoglycemic episodes:no Polydipsia/polyuria: no Visual disturbance: no Chest pain: no Paresthesias: no Glucose Monitoring: no  Accucheck frequency: Not Checking Taking Insulin?: no Blood Pressure Monitoring: not checking Retinal Examination: Up to Date Foot Exam: Up to Date Diabetic Education: Completed Pneumovax: Up to Date Influenza: Up to Date Aspirin: yes  Relevant past medical, surgical, family and social history reviewed and updated as indicated. Interim medical history since our last visit reviewed. Allergies and medications reviewed and updated.  Review of Systems  Constitutional: Positive for fatigue. Negative for activity change, appetite change, chills, diaphoresis, fever and unexpected weight change.  HENT: Negative.   Respiratory: Negative.   Cardiovascular: Negative.   Gastrointestinal: Negative.   Musculoskeletal: Negative.   Skin: Negative.   Neurological: Positive for dizziness. Negative for tremors, seizures, syncope, facial asymmetry, speech difficulty, weakness, light-headedness, numbness and headaches.  Psychiatric/Behavioral: Negative.     Per HPI unless specifically indicated above     Objective:    BP (!) 111/58   Pulse (!) 59   Temp 97.9 F (36.6 C)   Wt 162 lb 6.4 oz (73.7 kg)   SpO2 96%   BMI 29.70 kg/m   Wt Readings from Last 3 Encounters:  09/24/20 162 lb 6.4 oz (73.7 kg)  09/15/20 170 lb (77.1 kg)  04/23/20 172 lb (78 kg)    Physical Exam Vitals and nursing note reviewed.  Constitutional:      General: She is not in acute distress.    Appearance: Normal appearance. She is not ill-appearing, toxic-appearing or diaphoretic.  HENT:     Head:  Normocephalic and atraumatic.     Right Ear: External ear normal.     Left Ear: External ear normal.     Nose: Nose normal.     Mouth/Throat:     Mouth: Mucous membranes are moist.     Pharynx: Oropharynx is clear.  Eyes:     General: No scleral icterus.       Right eye: No discharge.        Left eye: No discharge.     Extraocular Movements: Extraocular movements intact.     Conjunctiva/sclera: Conjunctivae normal.     Pupils: Pupils are equal, round, and reactive to light.  Cardiovascular:     Rate and Rhythm: Normal rate and regular rhythm.     Pulses: Normal pulses.     Heart sounds: Normal heart sounds. No murmur heard. No friction rub. No gallop.   Pulmonary:     Effort: Pulmonary effort is normal. No respiratory distress.     Breath sounds: Normal breath sounds. No stridor. No wheezing, rhonchi or rales.  Chest:     Chest wall: No tenderness.  Musculoskeletal:        General: Normal range of motion.     Cervical back: Normal range of motion and neck supple.  Skin:    General: Skin is warm and dry.     Capillary Refill: Capillary refill takes less than 2 seconds.     Coloration: Skin is not jaundiced or pale.     Findings: No bruising, erythema, lesion or rash.     Comments: Cold sores on her mouth   Neurological:     General: No focal deficit present.     Mental Status: She is alert and oriented to person, place, and time. Mental status is at baseline.  Psychiatric:        Mood and Affect: Mood normal.        Behavior: Behavior normal.        Thought Content: Thought content normal.        Judgment: Judgment normal.     Results for orders placed or performed during the hospital encounter of 34/19/37  Basic metabolic panel  Result Value Ref Range   Sodium 132 (L) 135 - 145 mmol/L   Potassium 3.5 3.5 - 5.1 mmol/L   Chloride 99 98 - 111 mmol/L   CO2 25 22 - 32 mmol/L   Glucose, Bld 173 (H) 70 - 99 mg/dL   BUN 21 8 - 23 mg/dL   Creatinine, Ser 0.93 0.44 - 1.00  mg/dL   Calcium 8.5 (L) 8.9 - 10.3 mg/dL   GFR, Estimated >60 >60 mL/min   Anion gap 8 5 - 15  CBC  Result Value Ref Range   WBC 10.0 4.0 - 10.5 K/uL   RBC 4.46 3.87 - 5.11 MIL/uL   Hemoglobin 12.6 12.0 - 15.0 g/dL   HCT 38.0 36.0 - 46.0 %   MCV 85.2 80.0 - 100.0 fL   MCH 28.3 26.0 - 34.0 pg   MCHC 33.2 30.0 - 36.0 g/dL   RDW 13.2 11.5 - 15.5 %   Platelets 297 150 - 400 K/uL   nRBC 0.0  0.0 - 0.2 %  Troponin I (High Sensitivity)  Result Value Ref Range   Troponin I (High Sensitivity) 10 <18 ng/L      Assessment & Plan:   Problem List Items Addressed This Visit      Cardiovascular and Mediastinum   Essential hypertension - Primary    Overtreated. Will only take 60 of her imdur. Recheck 1 month. Call with any concerns.        Relevant Medications   isosorbide mononitrate (IMDUR) 60 MG 24 hr tablet   Other Relevant Orders   Comprehensive metabolic panel   Microalbumin, Urine Waived     Endocrine   Type 2 diabetes mellitus with hyperglycemia (HCC)    Under good control on current regimen with A1c of 6.2. Continue current regimen. Continue to monitor. Call with any concerns. Refills given. Labs drawn today.      Relevant Orders   Bayer DCA Hb A1c Waived   Comprehensive metabolic panel   Microalbumin, Urine Waived   CBC with Differential/Platelet     Other   Hyperlipidemia    Under good control on current regimen. Continue current regimen. Continue to monitor. Call with any concerns. Refills given. Labs drawn today.      Relevant Medications   isosorbide mononitrate (IMDUR) 60 MG 24 hr tablet   Other Relevant Orders   Comprehensive metabolic panel   Lipid Panel w/o Chol/HDL Ratio    Other Visit Diagnoses    Allergic reaction, subsequent encounter       Concern for alpha-gal. Will check labs and refer to allergy. Has epi-pen. Call with any concerns.    Relevant Orders   Alpha-Gal Panel   Ambulatory referral to Allergy   Stomatitis       Will treat with  lidocaine and valtrex. Call with any concerns or if not improving.    Tick bite, unspecified site, subsequent encounter       Significant concern for RMSF- will treat empicially. Await results.    Relevant Orders   Alpha-Gal Panel   Lyme Ab/Western Blot Reflex   Rocky mtn spotted fvr abs pnl(IgG+IgM)   Babesia microti Antibody Panel   Ehrlichia Antibody Panel   Fatigue, unspecified type       Labs drawn today. Await results. Treat as needed.   Relevant Orders   Lyme Ab/Western Blot Reflex   Rocky mtn spotted fvr abs pnl(IgG+IgM)   Babesia microti Antibody Panel   Ehrlichia Antibody Panel       Follow up plan: Return as scheduled.

## 2020-09-24 NOTE — Assessment & Plan Note (Addendum)
Overtreated. Will only take 60 of her imdur. Recheck 1 month. Call with any concerns.

## 2020-09-24 NOTE — Assessment & Plan Note (Signed)
Under good control on current regimen. Continue current regimen. Continue to monitor. Call with any concerns. Refills given. Labs drawn today.   

## 2020-09-24 NOTE — Assessment & Plan Note (Addendum)
Under good control on current regimen with A1c of 6.2. Continue current regimen. Continue to monitor. Call with any concerns. Refills given. Labs drawn today.

## 2020-09-30 LAB — COMPREHENSIVE METABOLIC PANEL
ALT: 7 IU/L (ref 0–32)
AST: 13 IU/L (ref 0–40)
Albumin/Globulin Ratio: 2 (ref 1.2–2.2)
Albumin: 4 g/dL (ref 3.7–4.7)
Alkaline Phosphatase: 48 IU/L (ref 44–121)
BUN/Creatinine Ratio: 21 (ref 12–28)
BUN: 16 mg/dL (ref 8–27)
Bilirubin Total: 0.5 mg/dL (ref 0.0–1.2)
CO2: 26 mmol/L (ref 20–29)
Calcium: 9.1 mg/dL (ref 8.7–10.3)
Chloride: 96 mmol/L (ref 96–106)
Creatinine, Ser: 0.78 mg/dL (ref 0.57–1.00)
Globulin, Total: 2 g/dL (ref 1.5–4.5)
Glucose: 166 mg/dL — ABNORMAL HIGH (ref 65–99)
Potassium: 4.5 mmol/L (ref 3.5–5.2)
Sodium: 138 mmol/L (ref 134–144)
Total Protein: 6 g/dL (ref 6.0–8.5)
eGFR: 77 mL/min/{1.73_m2} (ref >59)

## 2020-09-30 LAB — ALPHA-GAL PANEL
Allergen Lamb IgE: 1.13 kU/L — AB
Beef IgE: 6.34 kU/L — AB
IgE (Immunoglobulin E), Serum: 899 IU/mL — ABNORMAL HIGH (ref 6–495)
O215-IgE Alpha-Gal: 72.2 kU/L — AB
Pork IgE: 1.12 kU/L — AB

## 2020-09-30 LAB — CBC WITH DIFFERENTIAL/PLATELET
Basophils Absolute: 0 10*3/uL (ref 0.0–0.2)
Basos: 0 %
EOS (ABSOLUTE): 0.2 10*3/uL (ref 0.0–0.4)
Eos: 3 %
Hematocrit: 40.8 % (ref 34.0–46.6)
Hemoglobin: 12.9 g/dL (ref 11.1–15.9)
Immature Grans (Abs): 0.1 10*3/uL (ref 0.0–0.1)
Immature Granulocytes: 1 %
Lymphocytes Absolute: 2.3 10*3/uL (ref 0.7–3.1)
Lymphs: 34 %
MCH: 27.5 pg (ref 26.6–33.0)
MCHC: 31.6 g/dL (ref 31.5–35.7)
MCV: 87 fL (ref 79–97)
Monocytes Absolute: 0.7 10*3/uL (ref 0.1–0.9)
Monocytes: 10 %
Neutrophils Absolute: 3.5 10*3/uL (ref 1.4–7.0)
Neutrophils: 52 %
RBC: 4.69 x10E6/uL (ref 3.77–5.28)
RDW: 13.2 % (ref 11.7–15.4)
WBC: 6.8 10*3/uL (ref 3.4–10.8)

## 2020-09-30 LAB — LIPID PANEL W/O CHOL/HDL RATIO
Cholesterol, Total: 207 mg/dL — ABNORMAL HIGH (ref 100–199)
HDL: 46 mg/dL (ref >39)
LDL Chol Calc (NIH): 131 mg/dL — ABNORMAL HIGH (ref 0–99)
Triglycerides: 169 mg/dL — ABNORMAL HIGH (ref 0–149)
VLDL Cholesterol Cal: 30 mg/dL (ref 5–40)

## 2020-09-30 LAB — EHRLICHIA ANTIBODY PANEL
E. Chaffeensis (HME) IgM Titer: NEGATIVE
E.Chaffeensis (HME) IgG: NEGATIVE
HGE IgG Titer: NEGATIVE
HGE IgM Titer: NEGATIVE

## 2020-09-30 LAB — LYME DISEASE SEROLOGY W/REFLEX: Lyme Total Antibody EIA: NEGATIVE

## 2020-09-30 LAB — BABESIA MICROTI ANTIBODY PANEL
Babesia microti IgG: <1:10 {titer}
Babesia microti IgM: <1:10 {titer}

## 2020-09-30 LAB — ROCKY MTN SPOTTED FVR ABS PNL(IGG+IGM)
RMSF IgG: NEGATIVE
RMSF IgM: 0.83 index (ref 0.00–0.89)

## 2020-10-10 ENCOUNTER — Other Ambulatory Visit: Payer: Self-pay | Admitting: Family Medicine

## 2020-10-10 NOTE — Telephone Encounter (Signed)
Future OV 10/23/20  Approved per protocol.  Requested Prescriptions  Pending Prescriptions Disp Refills  . omeprazole (PRILOSEC) 20 MG capsule [Pharmacy Med Name: Omeprazole 20 MG Oral Capsule Delayed Release] 90 capsule 0    Sig: Take 1 capsule by mouth once daily     Gastroenterology: Proton Pump Inhibitors Passed - 10/10/2020 11:34 AM      Passed - Valid encounter within last 12 months    Recent Outpatient Visits          2 weeks ago Essential hypertension   [place], Megan P, DO   5 months ago Type 2 diabetes mellitus with hyperglycemia, without long-term current use of insulin ([place])   [facility], Megan P, DO   8 months ago Type 2 diabetes mellitus with hyperglycemia, without long-term current use of insulin ([place])   [place], Megan P, DO   11 months ago Visit for suture removal   [place], [place], DO   11 months ago Sebaceous cyst   [place], Barb Merino, DO      Future Appointments            In 1 week Wynetta Emery, Barb Merino, DO [place], [place]   In 3 months  [employer], [facility]

## 2020-10-23 ENCOUNTER — Encounter: Payer: Self-pay | Admitting: Family Medicine

## 2020-10-23 ENCOUNTER — Ambulatory Visit (INDEPENDENT_AMBULATORY_CARE_PROVIDER_SITE_OTHER): Payer: Medicare HMO | Admitting: Family Medicine

## 2020-10-23 ENCOUNTER — Other Ambulatory Visit: Payer: Self-pay

## 2020-10-23 VITALS — BP 165/61 | HR 50 | Temp 98.0°F | Ht 62.5 in | Wt 170.2 lb

## 2020-10-23 DIAGNOSIS — Z13820 Encounter for screening for osteoporosis: Secondary | ICD-10-CM | POA: Diagnosis not present

## 2020-10-23 DIAGNOSIS — F5101 Primary insomnia: Secondary | ICD-10-CM

## 2020-10-23 DIAGNOSIS — Z0000 Encounter for general adult medical examination without abnormal findings: Secondary | ICD-10-CM

## 2020-10-23 DIAGNOSIS — E782 Mixed hyperlipidemia: Secondary | ICD-10-CM | POA: Diagnosis not present

## 2020-10-23 DIAGNOSIS — I10 Essential (primary) hypertension: Secondary | ICD-10-CM

## 2020-10-23 DIAGNOSIS — I25118 Atherosclerotic heart disease of native coronary artery with other forms of angina pectoris: Secondary | ICD-10-CM | POA: Diagnosis not present

## 2020-10-23 DIAGNOSIS — Z1283 Encounter for screening for malignant neoplasm of skin: Secondary | ICD-10-CM

## 2020-10-23 DIAGNOSIS — R69 Illness, unspecified: Secondary | ICD-10-CM | POA: Diagnosis not present

## 2020-10-23 MED ORDER — CLOPIDOGREL BISULFATE 75 MG PO TABS: 75.0000 mg | ORAL_TABLET | Freq: Every day | ORAL | 1 refills | Status: DC

## 2020-10-23 MED ORDER — METFORMIN HCL 500 MG PO TABS: 1000.0000 mg | ORAL_TABLET | Freq: Two times a day (BID) | ORAL | 1 refills | Status: DC

## 2020-10-23 MED ORDER — TRAZODONE HCL 50 MG PO TABS: 50.0000 mg | ORAL_TABLET | Freq: Every day | ORAL | 1 refills | Status: DC

## 2020-10-23 MED ORDER — PAROXETINE HCL 30 MG PO TABS: 60.0000 mg | ORAL_TABLET | Freq: Every day | ORAL | 1 refills | Status: DC

## 2020-10-23 MED ORDER — NITROGLYCERIN 0.4 MG SL SUBL: 0.4000 mg | SUBLINGUAL_TABLET | SUBLINGUAL | 12 refills | Status: DC | PRN

## 2020-10-23 MED ORDER — FENOFIBRATE 145 MG PO TABS: 145.0000 mg | ORAL_TABLET | Freq: Every day | ORAL | 1 refills | Status: DC

## 2020-10-23 MED ORDER — OMEPRAZOLE 20 MG PO CPDR: 1.0000 | DELAYED_RELEASE_CAPSULE | Freq: Every day | ORAL | 1 refills | Status: DC

## 2020-10-23 MED ORDER — PRAVASTATIN SODIUM 40 MG PO TABS: 40.0000 mg | ORAL_TABLET | Freq: Every day | ORAL | 1 refills | Status: DC

## 2020-10-23 MED ORDER — LOSARTAN POTASSIUM 50 MG PO TABS: 50.0000 mg | ORAL_TABLET | Freq: Every day | ORAL | 1 refills | Status: DC

## 2020-10-23 MED ORDER — HYDROCHLOROTHIAZIDE 25 MG PO TABS: 25.0000 mg | ORAL_TABLET | Freq: Every day | ORAL | 3 refills | Status: DC

## 2020-10-23 MED ORDER — METOPROLOL TARTRATE 25 MG PO TABS: 37.5000 mg | ORAL_TABLET | Freq: Two times a day (BID) | ORAL | 1 refills | Status: DC

## 2020-10-23 NOTE — Progress Notes (Signed)
BP (!) 165/61   Pulse (!) 50   Temp 98 F (36.7 C)   Ht 5' 2.5" (1.588 m)   Wt 170 lb 3.2 oz (77.2 kg)   SpO2 98%   BMI 30.63 kg/m    Subjective:    Patient ID: Susan Allen, female    DOB: May 13, 1941, 80 y.o.   MRN: [id number]  HPI: Susan Allen is a 80 y.o. female presenting on 10/23/2020 for comprehensive medical examination. Current medical complaints include:  HYPERTENSION Hypertension status: uncontrolled  Satisfied with current treatment? no Duration of hypertension: chronic BP monitoring frequency:  not checking BP medication side effects:  no Medication compliance: excellent compliance Aspirin: no Recurrent headaches: no Visual changes: no Palpitations: no Dyspnea: no Chest pain: no Lower extremity edema: no Dizzy/lightheaded: no  HYPERLIPIDEMIA Hyperlipidemia status: excellent compliance Satisfied with current treatment?  yes Side effects:  no Medication compliance: excellent compliance Past cholesterol meds: pravastatin Supplements: none Aspirin:  no The 10-year ASCVD risk score Mikey Bussing DC Jr., et al., 2013) is: 68.8%   Values used to calculate the score:     Age: 54 years     Sex: Female     Is Non-Hispanic African American: No     Diabetic: Yes     Tobacco smoker: No     Systolic Blood Pressure: 801 mmHg     Is BP treated: Yes     HDL Cholesterol: 46 mg/dL     Total Cholesterol: 207 mg/dL Chest pain:  no Coronary artery disease:  no  DEPRESSION Mood status: controlled Satisfied with current treatment?: yes Symptom severity: mild  Duration of current treatment : chronic Side effects: no Medication compliance: excellent Psychotherapy/counseling: no  Previous psychiatric medications: paxil Depressed mood: no Anxious mood: no Anhedonia: no Significant weight loss or gain: no Insomnia: no  Fatigue: no Feelings of worthlessness or guilt: no Impaired concentration/indecisiveness: no Suicidal ideations: no Hopelessness: no Crying spells:  no Depression screen [facility] 2/9 10/23/2020 02/02/2020 01/16/2020 10/05/2019 09/22/2019  Decreased Interest 3 0 1 0 1  Down, Depressed, Hopeless 3 0 1 0 1  PHQ - 2 Score 6 0 2 0 2  Altered sleeping 3 0 0 0 3  Tired, decreased energy 3 0 0 0 3  Change in appetite 3 0 0 0 1  Feeling bad or failure about yourself  0 0 0 0 0  Trouble concentrating 1 0 0 0 0  Moving slowly or fidgety/restless 0 0 0 0 0  Suicidal thoughts 0 0 1 0 0  PHQ-9 Score 16 0 3 0 9  Difficult doing work/chores - Not difficult at all Not difficult at all - Not difficult at all    She currently lives with: husband Menopausal Symptoms: no  Depression Screen done today and results listed below:  Depression screen [facility] 2/9 10/23/2020 02/02/2020 01/16/2020 10/05/2019 09/22/2019  Decreased Interest 3 0 1 0 1  Down, Depressed, Hopeless 3 0 1 0 1  PHQ - 2 Score 6 0 2 0 2  Altered sleeping 3 0 0 0 3  Tired, decreased energy 3 0 0 0 3  Change in appetite 3 0 0 0 1  Feeling bad or failure about yourself  0 0 0 0 0  Trouble concentrating 1 0 0 0 0  Moving slowly or fidgety/restless 0 0 0 0 0  Suicidal thoughts 0 0 1 0 0  PHQ-9 Score 16 0 3 0 9  Difficult doing work/chores - Not difficult at  all Not difficult at all - Not difficult at all    Past Medical History:  Past Medical History:  Diagnosis Date  . Allergy   . Arthritis   . Bronchitis   . Coronary artery disease   . Depression   . Diabetes mellitus without complication ([place])   . GERD (gastroesophageal reflux disease)   . Headache   . Hypertension   . Myocardial infarction [facility]) June 2014, July 2014    Surgical History:  Past Surgical History:  Procedure Laterality Date  . ABDOMINAL HYSTERECTOMY    . APPENDECTOMY    . CORONARY ANGIOPLASTY     with stents   . DIGIT NAIL REMOVAL Bilateral 08/30/2015   Procedure: REMOVAL OF DIGIT NAILS/ BIL. PERM.REMOVAL INGROWN GREAT TOE NAILS;  Surgeon: Albertine Patricia, DPM;  Location: [facility] ORS;  Service: Podiatry;  Laterality:  Bilateral;  . 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 JOINT REPLACEMENT Bilateral    Total Knee Replacement, Dr Burnis Kingfisher, [place]  . TONSILLECTOMY      Medications:  Current Outpatient Medications on File Prior to Visit  Medication Sig  . alum & mag hydroxide-simeth (MAALOX/MYLANTA) 200-200-20 MG/5ML suspension Take 15 mLs by mouth every 6 (six) hours as needed for indigestion or heartburn.  . Blood Glucose Calibration (OT ULTRA/FASTTK CNTRL SOLN) SOLN See admin instructions.  . Blood Glucose Monitoring Suppl (FREESTYLE LITE) DEVI USE TO CHECK GLUCOSE ONCE DAILY  . cetirizine (ZYRTEC) 10 MG tablet Take 10 mg by mouth as needed for allergies.  . Coenzyme Q10 100 MG capsule Take 1 capsule by mouth daily.  . diclofenac sodium (VOLTAREN) 1 % GEL Apply 4 g topically 4 (four) times daily.  . diclofenac Sodium (VOLTAREN) 1 % GEL APPLY 4 GRAMS TOPICALLY 4 TIMES DAILY  . diphenhydrAMINE (BENADRYL) 25 MG tablet Take 25 mg by mouth as needed.  . doxycycline (VIBRA-TABS) 100 MG tablet Take 1 tablet (100 mg total) by mouth 2 (two) times daily.  . Easy Comfort Lancets MISC 3 (three) times daily. for testing  . FREESTYLE LITE test strip SMARTSIG:Strip(s) Via Meter Daily  . isosorbide mononitrate (IMDUR) 60 MG 24 hr tablet Take 60 mg by mouth daily.  Elmore Guise Devices (EASY MINI EJECT LANCING DEVICE) MISC See admin instructions.  . lidocaine (XYLOCAINE) 2 % solution Use as directed 15 mLs in the mouth or throat as needed for mouth pain.  . Simethicone (GAS-X PO) Take by mouth.  . triamcinolone cream (KENALOG) 0.1 % APPLY TO AFFECTED AREA TWICE DAILY   No current facility-administered medications on file prior to visit.    Allergies:  Allergies  Allergen Reactions  . Aspirin Hives and Other (See Comments)    "fainting"  . Lisinopril Cough   . Statins Other (See Comments)    Leg cramps    Social History:  Social History   Socioeconomic History  . Marital status: Married    Spouse name: Not on file  . Number of children: Not on file  . Years of education: Not on file  . Highest education level: Not on file  Occupational History  . Occupation: retired  Tobacco Use  . Smoking status: Former Smoker    Packs/day: 0.25    Types: Cigarettes    Quit date: 02/27/2004    Years since quitting: 16.6  . Smokeless tobacco: Never Used  Vaping Use  .  Vaping Use: Former  Substance and Sexual Activity  . Alcohol use: No  . Drug use: No  . Sexual activity: Not Currently  Other Topics Concern  . Not on file  Social History Narrative  . Not on file   Social Determinants of Health   Financial Resource Strain: Low Risk   . Difficulty of Paying Living Expenses: Not hard at all  Food Insecurity: No Food Insecurity  . Worried About Charity fundraiser in the Last Year: Never true  . Ran Out of Food in the Last Year: Never true  Transportation Needs: No Transportation Needs  . Lack of Transportation (Medical): No  . Lack of Transportation (Non-Medical): No  Physical Activity: Inactive  . Days of Exercise per Week: 0 days  . Minutes of Exercise per Session: 0 min  Stress: No Stress Concern Present  . Feeling of Stress : Not at all  Social Connections: Not on file  Intimate Partner Violence: Not on file   Social History   Tobacco Use  Smoking Status Former Smoker  . Packs/day: 0.25  . Types: Cigarettes  . Quit date: 02/27/2004  . Years since quitting: 16.6  Smokeless Tobacco Never Used   Social History   Substance and Sexual Activity  Alcohol Use No    Family History:  Family History  Problem Relation Age of Onset  . Colon cancer Mother 63  . Cancer Mother        colon  . Cancer Father        lung  . Cancer Sister        ovarian  . Breast cancer Neg Hx     Past medical history, surgical history,  medications, allergies, family history and social history reviewed with patient today and changes made to appropriate areas of the chart.   Review of Systems  Constitutional: Negative.   HENT: Negative.   Eyes: Positive for blurred vision (has a cataract). Negative for double vision, photophobia, pain, discharge and redness.  Respiratory: Negative.   Cardiovascular: Positive for chest pain (only with driving), palpitations and leg swelling. Negative for orthopnea, claudication and PND.  Gastrointestinal: Positive for constipation and heartburn. Negative for abdominal pain, blood in stool, diarrhea, melena, nausea and vomiting.  Genitourinary: Negative.   Musculoskeletal: Negative.   Skin: Negative.        Spot on her back  Neurological: Negative.   Endo/Heme/Allergies: Negative.   Psychiatric/Behavioral: Positive for depression. Negative for hallucinations, memory loss, substance abuse and suicidal ideas. The patient is nervous/anxious. The patient does not have insomnia.    All other ROS negative except what is listed above and in the HPI.      Objective:    BP (!) 165/61   Pulse (!) 50   Temp 98 F (36.7 C)   Ht 5' 2.5" (1.588 m)   Wt 170 lb 3.2 oz (77.2 kg)   SpO2 98%   BMI 30.63 kg/m   Wt Readings from Last 3 Encounters:  10/23/20 170 lb 3.2 oz (77.2 kg)  09/24/20 162 lb 6.4 oz (73.7 kg)  09/15/20 170 lb (77.1 kg)    Physical Exam Vitals and nursing note reviewed.  Constitutional:      General: She is not in acute distress.    Appearance: Normal appearance. She is not ill-appearing, toxic-appearing or diaphoretic.  HENT:     Head: Normocephalic and atraumatic.     Right Ear: Tympanic membrane, ear canal and external ear normal. There is no impacted  cerumen.     Left Ear: Tympanic membrane, ear canal and external ear normal. There is no impacted cerumen.     Nose: Nose normal. No congestion or rhinorrhea.     Mouth/Throat:     Mouth: Mucous membranes are moist.      Pharynx: Oropharynx is clear. No oropharyngeal exudate or posterior oropharyngeal erythema.  Eyes:     General: No scleral icterus.       Right eye: No discharge.        Left eye: No discharge.     Extraocular Movements: Extraocular movements intact.     Conjunctiva/sclera: Conjunctivae normal.     Pupils: Pupils are equal, round, and reactive to light.  Neck:     Vascular: No carotid bruit.  Cardiovascular:     Rate and Rhythm: Normal rate and regular rhythm.     Pulses: Normal pulses.     Heart sounds: No murmur heard. No friction rub. No gallop.   Pulmonary:     Effort: Pulmonary effort is normal. No respiratory distress.     Breath sounds: Normal breath sounds. No stridor. No wheezing, rhonchi or rales.  Chest:     Chest wall: No tenderness.  Abdominal:     General: Abdomen is flat. Bowel sounds are normal. There is no distension.     Palpations: Abdomen is soft. There is no mass.     Tenderness: There is no abdominal tenderness. There is no right CVA tenderness, left CVA tenderness, guarding or rebound.     Hernia: No hernia is present.  Genitourinary:    Comments: Breast and pelvic exams deferred with shared decision making Musculoskeletal:        General: No swelling, tenderness, deformity or signs of injury.     Cervical back: Normal range of motion and neck supple. No rigidity. No muscular tenderness.     Right lower leg: No edema.     Left lower leg: No edema.  Lymphadenopathy:     Cervical: No cervical adenopathy.  Skin:    General: Skin is warm and dry.     Capillary Refill: Capillary refill takes less than 2 seconds.     Coloration: Skin is not jaundiced or pale.     Findings: No bruising, erythema, lesion or rash.  Neurological:     General: No focal deficit present.     Mental Status: She is alert and oriented to person, place, and time. Mental status is at baseline.     Cranial Nerves: No cranial nerve deficit.     Sensory: No sensory deficit.     Motor: No  weakness.     Coordination: Coordination normal.     Gait: Gait normal.     Deep Tendon Reflexes: Reflexes normal.  Psychiatric:        Mood and Affect: Mood normal.        Behavior: Behavior normal.        Thought Content: Thought content normal.        Judgment: Judgment normal.     Results for orders placed or performed in visit on 09/24/20  Bayer DCA Hb A1c Waived  Result Value Ref Range   HB A1C (BAYER DCA - WAIVED) 6.2 <7.0 %  Comprehensive metabolic panel  Result Value Ref Range   Glucose 166 (H) 65 - 99 mg/dL   BUN 16 8 - 27 mg/dL   Creatinine, Ser 0.78 0.57 - 1.00 mg/dL   eGFR 77 >59 mL/min/1.73   BUN/Creatinine Ratio  21 12 - 28   Sodium 138 134 - 144 mmol/L   Potassium 4.5 3.5 - 5.2 mmol/L   Chloride 96 96 - 106 mmol/L   CO2 26 20 - 29 mmol/L   Calcium 9.1 8.7 - 10.3 mg/dL   Total Protein 6.0 6.0 - 8.5 g/dL   Albumin 4.0 3.7 - 4.7 g/dL   Globulin, Total 2.0 1.5 - 4.5 g/dL   Albumin/Globulin Ratio 2.0 1.2 - 2.2   Bilirubin Total 0.5 0.0 - 1.2 mg/dL   Alkaline Phosphatase 48 44 - 121 IU/L   AST 13 0 - 40 IU/L   ALT 7 0 - 32 IU/L  Lipid Panel w/o Chol/HDL Ratio  Result Value Ref Range   Cholesterol, Total 207 (H) 100 - 199 mg/dL   Triglycerides 169 (H) 0 - 149 mg/dL   HDL 46 >39 mg/dL   VLDL Cholesterol Cal 30 5 - 40 mg/dL   LDL Chol Calc (NIH) 131 (H) 0 - 99 mg/dL  Microalbumin, Urine Waived  Result Value Ref Range   Microalb, Ur Waived 30 (H) 0 - 19 mg/L   Creatinine, Urine Waived 300 10 - 300 mg/dL   Microalb/Creat Ratio <30 <30 mg/g  CBC with Differential/Platelet  Result Value Ref Range   WBC 6.8 3.4 - 10.8 x10E3/uL   RBC 4.69 3.77 - 5.28 x10E6/uL   Hemoglobin 12.9 11.1 - 15.9 g/dL   Hematocrit 40.8 34.0 - 46.6 %   MCV 87 79 - 97 fL   MCH 27.5 26.6 - 33.0 pg   MCHC 31.6 31.5 - 35.7 g/dL   RDW 13.2 11.7 - 15.4 %   Platelets CANCELED x10E3/uL   Neutrophils 52 Not Estab. %   Lymphs 34 Not Estab. %   Monocytes 10 Not Estab. %   Eos 3 Not Estab. %    Basos 0 Not Estab. %   Neutrophils Absolute 3.5 1.4 - 7.0 x10E3/uL   Lymphocytes Absolute 2.3 0.7 - 3.1 x10E3/uL   Monocytes Absolute 0.7 0.1 - 0.9 x10E3/uL   EOS (ABSOLUTE) 0.2 0.0 - 0.4 x10E3/uL   Basophils Absolute 0.0 0.0 - 0.2 x10E3/uL   Immature Granulocytes 1 Not Estab. %   Immature Grans (Abs) 0.1 0.0 - 0.1 x10E3/uL   Hematology Comments: Note:   Alpha-Gal Panel  Result Value Ref Range   Class Description Allergens Comment    IgE (Immunoglobulin E), Serum 899 (H) 6 - 495 IU/mL   [id number] Alpha-Gal 72.20 (A) Class V kU/L   Beef IgE 6.34 (A) Class IV kU/L   Pork IgE 1.12 (A) Class II kU/L   Allergen Lamb IgE 1.13 (A) Class II kU/L  Rocky mtn spotted fvr abs pnl(IgG+IgM)  Result Value Ref Range   RMSF IgG Negative Negative   RMSF IgM 0.83 0.00 - 0.89 index  Babesia microti Antibody Panel  Result Value Ref Range   Babesia microti IgM <1:10 Neg:<1:10   Babesia microti IgG <5:40 JWJ:<1:91  Ehrlichia Antibody Panel  Result Value Ref Range   E.Chaffeensis (HME) IgG Negative Neg:<1:64   E. Chaffeensis (HME) IgM Titer Negative Neg:<1:20   HGE IgG Titer Negative Neg:<1:64   HGE IgM Titer Negative Neg:<1:20  Lyme Disease Serology w/Reflex  Result Value Ref Range   Lyme Total Antibody EIA Negative Negative      Assessment & Plan:   Problem List Items Addressed This Visit      Cardiovascular and Mediastinum   Essential hypertension    Running high. Will start HCTZ and recheck  1 month. Call with any concerns.       Relevant Medications   hydrochlorothiazide (HYDRODIURIL) 25 MG tablet   fenofibrate (TRICOR) 145 MG tablet   losartan (COZAAR) 50 MG tablet   metoprolol tartrate (LOPRESSOR) 25 MG tablet   nitroGLYCERIN (NITROSTAT) 0.4 MG SL tablet   pravastatin (PRAVACHOL) 40 MG tablet   Coronary artery disease of native artery of native heart with stable angina pectoris (HCC)    Under good control on current regimen. Continue current regimen. Continue to monitor. Call  with any concerns. Refills given. Labs last visit.        Relevant Medications   hydrochlorothiazide (HYDRODIURIL) 25 MG tablet   fenofibrate (TRICOR) 145 MG tablet   losartan (COZAAR) 50 MG tablet   metoprolol tartrate (LOPRESSOR) 25 MG tablet   nitroGLYCERIN (NITROSTAT) 0.4 MG SL tablet   PARoxetine (PAXIL) 30 MG tablet   pravastatin (PRAVACHOL) 40 MG tablet   traZODone (DESYREL) 50 MG tablet     Other   Hyperlipidemia    Under good control on current regimen. Continue current regimen. Continue to monitor. Call with any concerns. Refills given.        Relevant Medications   hydrochlorothiazide (HYDRODIURIL) 25 MG tablet   fenofibrate (TRICOR) 145 MG tablet   losartan (COZAAR) 50 MG tablet   metoprolol tartrate (LOPRESSOR) 25 MG tablet   nitroGLYCERIN (NITROSTAT) 0.4 MG SL tablet   pravastatin (PRAVACHOL) 40 MG tablet   Primary insomnia    Under good control on current regimen. Continue current regimen. Continue to monitor. Call with any concerns. Refills given.         Other Visit Diagnoses    Routine general medical examination at a health care facility    -  Primary   Vaccines up to date. Screening labs checked today. DEXA ordered today. Continue diet and exercise. Call with any concerns.    Screening for osteoporosis       DEXA ordered today.   Relevant Orders   DG Bone Density   Screening for skin cancer       Referral to dermatology made today.   Relevant Orders   Ambulatory referral to Dermatology       Follow up plan: Return in about 4 weeks (around 11/20/2020) for follow up htn and mood.   LABORATORY TESTING:  - Pap smear: not applicable  IMMUNIZATIONS:   - Tdap: Tetanus vaccination status reviewed: last tetanus booster within 10 years. - Influenza: Up to date - Pneumovax: Up to date - Prevnar: Up to date - COVID: Up to date - Shingrix vaccine: Not applicable  SCREENING: -Mammogram: Not applicable  - Colonoscopy: Not applicable  - Bone  Density: Ordered today   PATIENT COUNSELING:   Advised to take 1 mg of folate supplement per day if capable of pregnancy.   Sexuality: Discussed sexually transmitted diseases, partner selection, use of condoms, avoidance of unintended pregnancy  and contraceptive alternatives.   Advised to avoid cigarette smoking.  I discussed with the patient that most people either abstain from alcohol or drink within safe limits (<=14/week and <=4 drinks/occasion for males, <=7/weeks and <= 3 drinks/occasion for females) and that the risk for alcohol disorders and other health effects rises proportionally with the number of drinks per week and how often a drinker exceeds daily limits.  Discussed cessation/primary prevention of drug use and availability of treatment for abuse.   Diet: Encouraged to adjust caloric intake to maintain  or achieve ideal body weight, to  reduce intake of dietary saturated fat and total fat, to limit sodium intake by avoiding high sodium foods and not adding table salt, and to maintain adequate dietary potassium and calcium preferably from fresh fruits, vegetables, and low-fat dairy products.    stressed the importance of regular exercise  Injury prevention: Discussed safety belts, safety helmets, smoke detector, smoking near bedding or upholstery.   Dental health: Discussed importance of regular tooth brushing, flossing, and dental visits.    NEXT PREVENTATIVE PHYSICAL DUE IN 1 YEAR. Return in about 4 weeks (around 11/20/2020) for follow up htn and mood.

## 2020-10-23 NOTE — Assessment & Plan Note (Signed)
Under good control on current regimen. Continue current regimen. Continue to monitor. Call with any concerns. Refills given.   

## 2020-10-23 NOTE — Assessment & Plan Note (Signed)
Under good control on current regimen. Continue current regimen. Continue to monitor. Call with any concerns. Refills given. Labs last visit.

## 2020-10-23 NOTE — Assessment & Plan Note (Signed)
Running high. Will start HCTZ and recheck 1 month. Call with any concerns.

## 2020-10-24 DIAGNOSIS — J3081 Allergic rhinitis due to animal (cat) (dog) hair and dander: Secondary | ICD-10-CM | POA: Diagnosis not present

## 2020-10-24 DIAGNOSIS — J301 Allergic rhinitis due to pollen: Secondary | ICD-10-CM | POA: Diagnosis not present

## 2020-10-24 DIAGNOSIS — L501 Idiopathic urticaria: Secondary | ICD-10-CM | POA: Diagnosis not present

## 2020-10-24 DIAGNOSIS — J3089 Other allergic rhinitis: Secondary | ICD-10-CM | POA: Diagnosis not present

## 2020-10-29 ENCOUNTER — Other Ambulatory Visit: Payer: Self-pay | Admitting: Family Medicine

## 2020-11-27 ENCOUNTER — Other Ambulatory Visit: Payer: Self-pay

## 2020-11-27 ENCOUNTER — Ambulatory Visit (INDEPENDENT_AMBULATORY_CARE_PROVIDER_SITE_OTHER): Payer: Medicare HMO | Admitting: Family Medicine

## 2020-11-27 ENCOUNTER — Encounter: Payer: Self-pay | Admitting: Family Medicine

## 2020-11-27 VITALS — BP 120/62 | HR 47 | Temp 97.9°F | Wt 170.0 lb

## 2020-11-27 DIAGNOSIS — R69 Illness, unspecified: Secondary | ICD-10-CM | POA: Diagnosis not present

## 2020-11-27 DIAGNOSIS — I10 Essential (primary) hypertension: Secondary | ICD-10-CM | POA: Diagnosis not present

## 2020-11-27 DIAGNOSIS — F331 Major depressive disorder, recurrent, moderate: Secondary | ICD-10-CM | POA: Diagnosis not present

## 2020-11-27 MED ORDER — HYDROCHLOROTHIAZIDE 25 MG PO TABS: 25.0000 mg | ORAL_TABLET | Freq: Every day | ORAL | 1 refills | Status: DC

## 2020-11-27 NOTE — Assessment & Plan Note (Signed)
Does not want to change medication at this time. Will work on getting out a bit more. Call with any concerns. Continue to monitor.

## 2020-11-27 NOTE — Progress Notes (Signed)
BP 120/62   Pulse (!) 47   Temp 97.9 F (36.6 C)   Wt 170 lb (77.1 kg)   SpO2 98%   BMI 30.60 kg/m    Subjective:    Patient ID: Susan Allen, female    DOB: [birth year], 80 y.o.   MRN: [id number]  HPI: Susan Allen is a 80 y.o. female  Chief Complaint  Patient presents with   Hypertension   Depression   HYPERTENSION Hypertension status: better  Satisfied with current treatment? yes Duration of hypertension: chronic BP monitoring frequency:  not checking BP medication side effects:  no Medication compliance: excellent compliance Aspirin: no Recurrent headaches: no Visual changes: no Palpitations: no Dyspnea: no Chest pain: no Lower extremity edema: no Dizzy/lightheaded: no  DEPRESSION- has a lot of social stuff going on, feeling frustrated and down Mood status: stable Satisfied with current treatment?: yes Symptom severity: moderate  Duration of current treatment : chronic Side effects: no Medication compliance: excellent compliance Psychotherapy/counseling: no  Previous psychiatric medications: paxil Depressed mood: yes Anxious mood: no Anhedonia: no Significant weight loss or gain: no Insomnia: yes  Fatigue: yes Feelings of worthlessness or guilt: yes Impaired concentration/indecisiveness: no Suicidal ideations: no Hopelessness: no Crying spells: no Depression screen [facility] 2/9 11/27/2020 10/23/2020 02/02/2020 01/16/2020 10/05/2019  Decreased Interest 3 3 0 1 0  Down, Depressed, Hopeless 3 3 0 1 0  PHQ - 2 Score 6 6 0 2 0  Altered sleeping 0 3 0 0 0  Tired, decreased energy 3 3 0 0 0  Change in appetite 3 3 0 0 0  Feeling bad or failure about yourself  0 0 0 0 0  Trouble concentrating 1 1 0 0 0  Moving slowly or fidgety/restless 0 0 0 0 0  Suicidal thoughts 0 0 0 1 0  PHQ-9 Score 13 16 0 3 0  Difficult doing work/chores Not difficult at all - Not difficult at all Not difficult at all -  Some recent data might be hidden    Relevant past medical,  surgical, family and social history reviewed and updated as indicated. Interim medical history since our last visit reviewed. Allergies and medications reviewed and updated.  Review of Systems  Constitutional: Negative.   Respiratory: Negative.    Cardiovascular: Negative.   Gastrointestinal: Negative.   Musculoskeletal: Negative.   Neurological: Negative.   Psychiatric/Behavioral:  Positive for dysphoric mood. Negative for agitation, behavioral problems, confusion, decreased concentration, hallucinations, self-injury, sleep disturbance and suicidal ideas. The patient is not nervous/anxious and is not hyperactive.    Per HPI unless specifically indicated above     Objective:    BP 120/62   Pulse (!) 47   Temp 97.9 F (36.6 C)   Wt 170 lb (77.1 kg)   SpO2 98%   BMI 30.60 kg/m   Wt Readings from Last 3 Encounters:  11/27/20 170 lb (77.1 kg)  10/23/20 170 lb 3.2 oz (77.2 kg)  09/24/20 162 lb 6.4 oz (73.7 kg)    Physical Exam Vitals and nursing note reviewed.  Constitutional:      General: She is not in acute distress.    Appearance: Normal appearance. She is not ill-appearing, toxic-appearing or diaphoretic.  HENT:     Head: Normocephalic and atraumatic.     Right Ear: External ear normal.     Left Ear: External ear normal.     Nose: Nose normal.     Mouth/Throat:     Mouth: Mucous membranes are  moist.     Pharynx: Oropharynx is clear.  Eyes:     General: No scleral icterus.       Right eye: No discharge.        Left eye: No discharge.     Extraocular Movements: Extraocular movements intact.     Conjunctiva/sclera: Conjunctivae normal.     Pupils: Pupils are equal, round, and reactive to light.  Cardiovascular:     Rate and Rhythm: Normal rate and regular rhythm.     Pulses: Normal pulses.     Heart sounds: Normal heart sounds. No murmur heard.   No friction rub. No gallop.  Pulmonary:     Effort: Pulmonary effort is normal. No respiratory distress.     Breath  sounds: Normal breath sounds. No stridor. No wheezing, rhonchi or rales.  Chest:     Chest wall: No tenderness.  Musculoskeletal:        General: Normal range of motion.     Cervical back: Normal range of motion and neck supple.  Skin:    General: Skin is warm and dry.     Capillary Refill: Capillary refill takes less than 2 seconds.     Coloration: Skin is not jaundiced or pale.     Findings: No bruising, erythema, lesion or rash.  Neurological:     General: No focal deficit present.     Mental Status: She is alert and oriented to person, place, and time. Mental status is at baseline.  Psychiatric:        Mood and Affect: Mood normal.        Behavior: Behavior normal.        Thought Content: Thought content normal.        Judgment: Judgment normal.    Results for orders placed or performed in visit on 09/24/20  Bayer DCA Hb A1c Waived  Result Value Ref Range   HB A1C (BAYER DCA - WAIVED) 6.2 <7.0 %  Comprehensive metabolic panel  Result Value Ref Range   Glucose 166 (H) 65 - 99 mg/dL   BUN 16 8 - 27 mg/dL   Creatinine, Ser 0.78 0.57 - 1.00 mg/dL   eGFR 77 >59 mL/min/1.73   BUN/Creatinine Ratio 21 12 - 28   Sodium 138 134 - 144 mmol/L   Potassium 4.5 3.5 - 5.2 mmol/L   Chloride 96 96 - 106 mmol/L   CO2 26 20 - 29 mmol/L   Calcium 9.1 8.7 - 10.3 mg/dL   Total Protein 6.0 6.0 - 8.5 g/dL   Albumin 4.0 3.7 - 4.7 g/dL   Globulin, Total 2.0 1.5 - 4.5 g/dL   Albumin/Globulin Ratio 2.0 1.2 - 2.2   Bilirubin Total 0.5 0.0 - 1.2 mg/dL   Alkaline Phosphatase 48 44 - 121 IU/L   AST 13 0 - 40 IU/L   ALT 7 0 - 32 IU/L  Lipid Panel w/o Chol/HDL Ratio  Result Value Ref Range   Cholesterol, Total 207 (H) 100 - 199 mg/dL   Triglycerides 169 (H) 0 - 149 mg/dL   HDL 46 >39 mg/dL   VLDL Cholesterol Cal 30 5 - 40 mg/dL   LDL Chol Calc (NIH) 131 (H) 0 - 99 mg/dL  Microalbumin, Urine Waived  Result Value Ref Range   Microalb, Ur Waived 30 (H) 0 - 19 mg/L   Creatinine, Urine Waived 300  10 - 300 mg/dL   Microalb/Creat Ratio <30 <30 mg/g  CBC with Differential/Platelet  Result Value Ref Range  WBC 6.8 3.4 - 10.8 x10E3/uL   RBC 4.69 3.77 - 5.28 x10E6/uL   Hemoglobin 12.9 11.1 - 15.9 g/dL   Hematocrit 40.8 34.0 - 46.6 %   MCV 87 79 - 97 fL   MCH 27.5 26.6 - 33.0 pg   MCHC 31.6 31.5 - 35.7 g/dL   RDW 13.2 11.7 - 15.4 %   Platelets CANCELED x10E3/uL   Neutrophils 52 Not Estab. %   Lymphs 34 Not Estab. %   Monocytes 10 Not Estab. %   Eos 3 Not Estab. %   Basos 0 Not Estab. %   Neutrophils Absolute 3.5 1.4 - 7.0 x10E3/uL   Lymphocytes Absolute 2.3 0.7 - 3.1 x10E3/uL   Monocytes Absolute 0.7 0.1 - 0.9 x10E3/uL   EOS (ABSOLUTE) 0.2 0.0 - 0.4 x10E3/uL   Basophils Absolute 0.0 0.0 - 0.2 x10E3/uL   Immature Granulocytes 1 Not Estab. %   Immature Grans (Abs) 0.1 0.0 - 0.1 x10E3/uL   Hematology Comments: Note:   Alpha-Gal Panel  Result Value Ref Range   Class Description Allergens Comment    IgE (Immunoglobulin E), Serum 899 (H) 6 - 495 IU/mL   [id number] Alpha-Gal 72.20 (A) Class V kU/L   Beef IgE 6.34 (A) Class IV kU/L   Pork IgE 1.12 (A) Class II kU/L   Allergen Lamb IgE 1.13 (A) Class II kU/L  Rocky mtn spotted fvr abs pnl(IgG+IgM)  Result Value Ref Range   RMSF IgG Negative Negative   RMSF IgM 0.83 0.00 - 0.89 index  Babesia microti Antibody Panel  Result Value Ref Range   Babesia microti IgM <1:10 Neg:<1:10   Babesia microti IgG <6:78 LFY:<1:01  Ehrlichia Antibody Panel  Result Value Ref Range   E.Chaffeensis (HME) IgG Negative Neg:<1:64   E. Chaffeensis (HME) IgM Titer Negative Neg:<1:20   HGE IgG Titer Negative Neg:<1:64   HGE IgM Titer Negative Neg:<1:20  Lyme Disease Serology w/Reflex  Result Value Ref Range   Lyme Total Antibody EIA Negative Negative      Assessment & Plan:   Problem List Items Addressed This Visit       Cardiovascular and Mediastinum   Essential hypertension - Primary    Under good control on current regimen. Continue current  regimen. Continue to monitor. Call with any concerns. Refills given. Labs drawn today.        Relevant Medications   hydrochlorothiazide (HYDRODIURIL) 25 MG tablet   Other Relevant Orders   Basic metabolic panel     Other   Moderate episode of recurrent major depressive disorder ([place])    Does not want to change medication at this time. Will work on getting out a bit more. Call with any concerns. Continue to monitor.          Follow up plan: Return November 6 month follow up.

## 2020-11-27 NOTE — Assessment & Plan Note (Signed)
Under good control on current regimen. Continue current regimen. Continue to monitor. Call with any concerns. Refills given. Labs drawn today.   

## 2020-11-28 ENCOUNTER — Ambulatory Visit (INDEPENDENT_AMBULATORY_CARE_PROVIDER_SITE_OTHER): Payer: PPO | Admitting: Vascular Surgery

## 2020-11-28 ENCOUNTER — Encounter (INDEPENDENT_AMBULATORY_CARE_PROVIDER_SITE_OTHER): Payer: PPO

## 2020-11-28 LAB — BASIC METABOLIC PANEL
BUN/Creatinine Ratio: 13 (ref 12–28)
BUN: 11 mg/dL (ref 8–27)
CO2: 28 mmol/L (ref 20–29)
Calcium: 9.6 mg/dL (ref 8.7–10.3)
Chloride: 89 mmol/L — ABNORMAL LOW (ref 96–106)
Creatinine, Ser: 0.84 mg/dL (ref 0.57–1.00)
Glucose: 100 mg/dL — ABNORMAL HIGH (ref 65–99)
Potassium: 4.3 mmol/L (ref 3.5–5.2)
Sodium: 130 mmol/L — ABNORMAL LOW (ref 134–144)
eGFR: 71 mL/min/{1.73_m2} (ref >59)

## 2020-12-26 DIAGNOSIS — I10 Essential (primary) hypertension: Secondary | ICD-10-CM | POA: Diagnosis not present

## 2020-12-26 DIAGNOSIS — R0602 Shortness of breath: Secondary | ICD-10-CM | POA: Diagnosis not present

## 2020-12-26 DIAGNOSIS — E782 Mixed hyperlipidemia: Secondary | ICD-10-CM | POA: Diagnosis not present

## 2020-12-26 DIAGNOSIS — I2510 Atherosclerotic heart disease of native coronary artery without angina pectoris: Secondary | ICD-10-CM | POA: Diagnosis not present

## 2021-01-22 DIAGNOSIS — I2510 Atherosclerotic heart disease of native coronary artery without angina pectoris: Secondary | ICD-10-CM | POA: Diagnosis not present

## 2021-01-22 DIAGNOSIS — R0602 Shortness of breath: Secondary | ICD-10-CM | POA: Diagnosis not present

## 2021-01-30 DIAGNOSIS — I10 Essential (primary) hypertension: Secondary | ICD-10-CM | POA: Diagnosis not present

## 2021-01-30 DIAGNOSIS — I2510 Atherosclerotic heart disease of native coronary artery without angina pectoris: Secondary | ICD-10-CM | POA: Diagnosis not present

## 2021-01-30 DIAGNOSIS — E782 Mixed hyperlipidemia: Secondary | ICD-10-CM | POA: Diagnosis not present

## 2021-01-30 DIAGNOSIS — Z955 Presence of coronary angioplasty implant and graft: Secondary | ICD-10-CM | POA: Diagnosis not present

## 2021-02-03 ENCOUNTER — Ambulatory Visit (INDEPENDENT_AMBULATORY_CARE_PROVIDER_SITE_OTHER): Payer: Medicare HMO

## 2021-02-03 VITALS — Ht 63.0 in | Wt 173.4 lb

## 2021-02-03 DIAGNOSIS — Z0000 Encounter for general adult medical examination without abnormal findings: Secondary | ICD-10-CM

## 2021-02-03 NOTE — Patient Instructions (Signed)
Susan Allen , Thank you for taking time to come for your Medicare Wellness Visit. I appreciate your ongoing commitment to your health goals. Please review the following plan we discussed and let me know if I can assist you in the future.   Screening recommendations/referrals: Colonoscopy: due Mammogram: not required Bone Density: completed 09/13/2013 Recommended yearly ophthalmology/optometry visit for glaucoma screening and checkup Recommended yearly dental visit for hygiene and checkup  Vaccinations: Influenza vaccine: due Pneumococcal vaccine: completed 03/31/2016 Tdap vaccine: completed 12/25/2011, due 12/24/2021 Shingles vaccine: discussed   Covid-19: 08/30/2019, 07/31/2019  Advanced directives: Advance directive discussed with you today.   Conditions/risks identified: none  Next appointment: Follow up in one year for your annual wellness visit    Preventive Care 65 Years and Older, Female Preventive care refers to lifestyle choices and visits with your health care provider that can promote health and wellness. What does preventive care include? A yearly physical exam. This is also called an annual well check. Dental exams once or twice a year. Routine eye exams. Ask your health care provider how often you should have your eyes checked. Personal lifestyle choices, including: Daily care of your teeth and gums. Regular physical activity. Eating a healthy diet. Avoiding tobacco and drug use. Limiting alcohol use. Practicing safe sex. Taking low-dose aspirin every day. Taking vitamin and mineral supplements as recommended by your health care provider. What happens during an annual well check? The services and screenings done by your health care provider during your annual well check will depend on your age, overall health, lifestyle risk factors, and family history of disease. Counseling  Your health care provider may ask you questions about your: Alcohol use. Tobacco use. Drug  use. Emotional well-being. Home and relationship well-being. Sexual activity. Eating habits. History of falls. Memory and ability to understand (cognition). Work and work Statistician. Reproductive health. Screening  You may have the following tests or measurements: Height, weight, and BMI. Blood pressure. Lipid and cholesterol levels. These may be checked every 5 years, or more frequently if you are over 52 years old. Skin check. Lung cancer screening. You may have this screening every year starting at age 103 if you have a 30-pack-year history of smoking and currently smoke or have quit within the past 15 years. Fecal occult blood test (FOBT) of the stool. You may have this test every year starting at age 61. Flexible sigmoidoscopy or colonoscopy. You may have a sigmoidoscopy every 5 years or a colonoscopy every 10 years starting at age 27. Hepatitis C blood test. Hepatitis B blood test. Sexually transmitted disease (STD) testing. Diabetes screening. This is done by checking your blood sugar (glucose) after you have not eaten for a while (fasting). You may have this done every 1-3 years. Bone density scan. This is done to screen for osteoporosis. You may have this done starting at age 34. Mammogram. This may be done every 1-2 years. Talk to your health care provider about how often you should have regular mammograms. Talk with your health care provider about your test results, treatment options, and if necessary, the need for more tests. Vaccines  Your health care provider may recommend certain vaccines, such as: Influenza vaccine. This is recommended every year. Tetanus, diphtheria, and acellular pertussis (Tdap, Td) vaccine. You may need a Td booster every 10 years. Zoster vaccine. You may need this after age 46. Pneumococcal 13-valent conjugate (PCV13) vaccine. One dose is recommended after age 23. Pneumococcal polysaccharide (PPSV23) vaccine. One dose is recommended after  age  47. Talk to your health care provider about which screenings and vaccines you need and how often you need them. This information is not intended to replace advice given to you by your health care provider. Make sure you discuss any questions you have with your health care provider. Document Released: 06/07/2015 Document Revised: 01/29/2016 Document Reviewed: 03/12/2015 Elsevier Interactive Patient Education  2017 [employer] Prevention in the Home Falls can cause injuries. They can happen to people of all ages. There are many things you can do to make your home safe and to help prevent falls. What can I do on the outside of my home? Regularly fix the edges of walkways and driveways and fix any cracks. Remove anything that might make you trip as you walk through a door, such as a raised step or threshold. Trim any bushes or trees on the path to your home. Use bright outdoor lighting. Clear any walking paths of anything that might make someone trip, such as rocks or tools. Regularly check to see if handrails are loose or broken. Make sure that both sides of any steps have handrails. Any raised decks and porches should have guardrails on the edges. Have any leaves, snow, or ice cleared regularly. Use sand or salt on walking paths during winter. Clean up any spills in your garage right away. This includes oil or grease spills. What can I do in the bathroom? Use night lights. Install grab bars by the toilet and in the tub and shower. Do not use towel bars as grab bars. Use non-skid mats or decals in the tub or shower. If you need to sit down in the shower, use a plastic, non-slip stool. Keep the floor dry. Clean up any water that spills on the floor as soon as it happens. Remove soap buildup in the tub or shower regularly. Attach bath mats securely with double-sided non-slip rug tape. Do not have throw rugs and other things on the floor that can make you trip. What can I do in the  bedroom? Use night lights. Make sure that you have a light by your bed that is easy to reach. Do not use any sheets or blankets that are too big for your bed. They should not hang down onto the floor. Have a firm chair that has side arms. You can use this for support while you get dressed. Do not have throw rugs and other things on the floor that can make you trip. What can I do in the kitchen? Clean up any spills right away. Avoid walking on wet floors. Keep items that you use a lot in easy-to-reach places. If you need to reach something above you, use a strong step stool that has a grab bar. Keep electrical cords out of the way. Do not use floor polish or wax that makes floors slippery. If you must use wax, use non-skid floor wax. Do not have throw rugs and other things on the floor that can make you trip. What can I do with my stairs? Do not leave any items on the stairs. Make sure that there are handrails on both sides of the stairs and use them. Fix handrails that are broken or loose. Make sure that handrails are as long as the stairways. Check any carpeting to make sure that it is firmly attached to the stairs. Fix any carpet that is loose or worn. Avoid having throw rugs at the top or bottom of the stairs. If you do  have throw rugs, attach them to the floor with carpet tape. Make sure that you have a light switch at the top of the stairs and the bottom of the stairs. If you do not have them, ask someone to add them for you. What else can I do to help prevent falls? Wear shoes that: Do not have high heels. Have rubber bottoms. Are comfortable and fit you well. Are closed at the toe. Do not wear sandals. If you use a stepladder: Make sure that it is fully opened. Do not climb a closed stepladder. Make sure that both sides of the stepladder are locked into place. Ask someone to hold it for you, if possible. Clearly mark and make sure that you can see: Any grab bars or  handrails. First and last steps. Where the edge of each step is. Use tools that help you move around (mobility aids) if they are needed. These include: Canes. Walkers. Scooters. Crutches. Turn on the lights when you go into a dark area. Replace any light bulbs as soon as they burn out. Set up your furniture so you have a clear path. Avoid moving your furniture around. If any of your floors are uneven, fix them. If there are any pets around you, be aware of where they are. Review your medicines with your doctor. Some medicines can make you feel dizzy. This can increase your chance of falling. Ask your doctor what other things that you can do to help prevent falls. This information is not intended to replace advice given to you by your health care provider. Make sure you discuss any questions you have with your health care provider. Document Released: 03/07/2009 Document Revised: 10/17/2015 Document Reviewed: 06/15/2014 Elsevier Interactive Patient Education  2017 [employer].

## 2021-02-03 NOTE — Progress Notes (Signed)
I connected with Elmyra Ricks today by telephone and verified that I am speaking with the correct person using two identifiers. Location patient: home Location provider: work Persons participating in the virtual visit: Ramonia, Zuleger LPN.   I discussed the limitations, risks, security and privacy concerns of performing an evaluation and management service by telephone and the availability of in person appointments. I also discussed with the patient that there may be a patient responsible charge related to this service. The patient expressed understanding and verbally consented to this telephonic visit.    Interactive audio and video telecommunications were attempted between this provider and patient, however failed, due to patient having technical difficulties OR patient did not have access to video capability.  We continued and completed visit with audio only.     Vital signs may be patient reported or missing.  Subjective:   Susan Allen is a 80 y.o. female who presents for Medicare Annual (Subsequent) preventive examination.  Review of Systems     Cardiac Risk Factors include: advanced age (>63mn, >>78women);diabetes mellitus;dyslipidemia;hypertension;obesity (BMI >30kg/m2);sedentary lifestyle     Objective:    Today's Vitals   02/03/21 1027 02/03/21 1028  Weight: 173 lb 6.4 oz (78.7 kg)   Height: '5\' 3"'$  (1.6 m)   PainSc:  4    Body mass index is 30.72 kg/m.  Advanced Directives 02/03/2021 09/15/2020 02/02/2020 02/04/2017 10/12/2016 08/30/2015 08/26/2015  Does Patient Have a Medical Advance Directive? No No No No No No No  Would patient like information on creating a medical advance directive? - - - - - No - patient declined information No - patient declined information    Current Medications (verified) Outpatient Encounter Medications as of 02/03/2021  Medication Sig   Blood Glucose Calibration (OT ULTRA/FASTTK CNTRL SOLN) SOLN See admin instructions.   Blood Glucose  Monitoring Suppl (FREESTYLE LITE) DEVI USE TO CHECK GLUCOSE ONCE DAILY   cetirizine (ZYRTEC) 10 MG tablet Take 10 mg by mouth daily as needed for allergies.   clopidogrel (PLAVIX) 75 MG tablet Take 1 tablet (75 mg total) by mouth daily.   Coenzyme Q10 100 MG capsule Take 100 mg by mouth daily.   diclofenac Sodium (VOLTAREN) 1 % GEL APPLY 4 GRAMS TOPICALLY 4 TIMES DAILY (Patient taking differently: Apply 4 g topically 4 (four) times daily as needed (pain).)   Easy Comfort Lancets MISC 3 (three) times daily. for testing   fenofibrate (TRICOR) 145 MG tablet Take 1 tablet (145 mg total) by mouth at bedtime.   FREESTYLE LITE test strip SMARTSIG:Strip(s) Via Meter Daily   hydrochlorothiazide (HYDRODIURIL) 25 MG tablet Take 1 tablet (25 mg total) by mouth daily.   isosorbide mononitrate (IMDUR) 60 MG 24 hr tablet Take 60 mg by mouth daily.   Lancet Devices (EASY MINI EJECT LANCING DEVICE) MISC See admin instructions.   lidocaine (XYLOCAINE) 2 % solution Use as directed 15 mLs in the mouth or throat as needed for mouth pain.   losartan (COZAAR) 50 MG tablet Take 1 tablet (50 mg total) by mouth daily.   metFORMIN (GLUCOPHAGE) 500 MG tablet Take 2 tablets (1,000 mg total) by mouth 2 (two) times daily with a meal.   metoprolol tartrate (LOPRESSOR) 25 MG tablet Take 1.5 tablets (37.5 mg total) by mouth 2 (two) times daily.   nitroGLYCERIN (NITROSTAT) 0.4 MG SL tablet Place 1 tablet (0.4 mg total) under the tongue every 5 (five) minutes as needed for chest pain.   omeprazole (PRILOSEC) 20 MG capsule  Take 1 capsule (20 mg total) by mouth daily.   PARoxetine (PAXIL) 30 MG tablet Take 2 tablets (60 mg total) by mouth at bedtime.   pravastatin (PRAVACHOL) 40 MG tablet Take 1 tablet (40 mg total) by mouth daily.   Simethicone (GAS-X PO) Take 1 tablet by mouth daily as needed (flatulence).   traZODone (DESYREL) 50 MG tablet Take 1 tablet (50 mg total) by mouth at bedtime.   triamcinolone cream (KENALOG) 0.1 %  APPLY TO AFFECTED AREA TWICE DAILY (Patient taking differently: Apply 1 application topically daily as needed (irritation).)   diclofenac sodium (VOLTAREN) 1 % GEL Apply 4 g topically 4 (four) times daily. (Patient not taking: No sig reported)   No facility-administered encounter medications on file as of 02/03/2021.    Allergies (verified) Alpha-gal, Aspirin, Lisinopril, and Statins   History: Past Medical History:  Diagnosis Date   Allergy    Arthritis    Bronchitis    Coronary artery disease    Depression    Diabetes mellitus without complication ([place])    GERD (gastroesophageal reflux disease)    Headache    Hypertension    Myocardial infarction [facility]) June 2014, July 2014   Past Surgical History:  Procedure Laterality Date   ABDOMINAL HYSTERECTOMY     APPENDECTOMY     CORONARY ANGIOPLASTY     with stents    DIGIT NAIL REMOVAL Bilateral 08/30/2015   Procedure: REMOVAL OF DIGIT NAILS/ BIL. 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   Procedure: EXCISION MORTON'S NEUROMA;  Surgeon: Albertine Patricia, DPM;  Location: [facility] ORS;  Service: Podiatry;  Laterality: Right;   JOINT REPLACEMENT Bilateral    Total Knee Replacement, Dr Burnis Kingfisher, [place]     Family History  Problem Relation Age of Onset   Colon cancer Mother 29   Cancer Mother        colon   Cancer Father        lung   Cancer Sister        ovarian   Breast cancer Neg Hx    Social History   Socioeconomic History   Marital status: Married    Spouse name: Not on file   Number of children: Not on file   Years of education: Not on file   Highest education level: Not on file  Occupational History   Occupation: retired  Tobacco Use   Smoking status: Former    Packs/day: 0.25    Types: Cigarettes    Quit date: 02/27/2004    Years since  quitting: 16.9   Smokeless tobacco: Never  Vaping Use   Vaping Use: Former  Substance and Sexual Activity   Alcohol use: No   Drug use: No   Sexual activity: Not Currently  Other Topics Concern   Not on file  Social History Narrative   Not on file   Social Determinants of Health   Financial Resource Strain: Low Risk    Difficulty of Paying Living Expenses: Not hard at all  Food Insecurity: No Food Insecurity   Worried About Charity fundraiser in the Last Year: Never true   [place] in the Last Year: Never true  Transportation Needs: No Transportation Needs   Lack of Transportation (Medical): No   Lack of Transportation (Non-Medical): No  Physical Activity: Inactive   Days  of Exercise per Week: 0 days   Minutes of Exercise per Session: 0 min  Stress: No Stress Concern Present   Feeling of Stress : Not at all  Social Connections: Not on file    Tobacco Counseling Counseling given: Not Answered   Clinical Intake:  Pre-visit preparation completed: Yes  Pain : 0-10 Pain Score: 4  Pain Type: Acute pain Pain Location: Chest Pain Descriptors / Indicators: Sore Pain Onset: 1 to 4 weeks ago Pain Frequency: Intermittent     Nutritional Status: BMI > 30  Obese Nutritional Risks: None Diabetes: Yes  How often do you need to have someone help you when you read instructions, pamphlets, or other written materials from your doctor or pharmacy?: 1 - Never What is the last grade level you completed in school?: 12th grade  Diabetic? Yes Nutrition Risk Assessment:  Has the patient had any N/V/D within the last 2 months?  No  Does the patient have any non-healing wounds?  No  Has the patient had any unintentional weight loss or weight gain?  No   Diabetes:  Is the patient diabetic?  Yes  If diabetic, was a CBG obtained today?  No  Did the patient bring in their glucometer from home?  No  How often do you monitor your CBG's? Does not.   Financial Strains and  Diabetes Management:  Are you having any financial strains with the device, your supplies or your medication? No .  Does the patient want to be seen by Chronic Care Management for management of their diabetes?  No  Would the patient like to be referred to a Nutritionist or for Diabetic Management?  No   Diabetic Exams:  Diabetic Eye Exam: Overdue for diabetic eye exam. Pt has been advised about the importance in completing this exam. Patient advised to call and schedule an eye exam. Diabetic Foot Exam: Completed 04/23/2020   Interpreter Needed?: No  Information entered by :: NAllen LPN   Activities of Daily Living In your present state of health, do you have any difficulty performing the following activities: 02/03/2021 10/23/2020  Hearing? N N  Vision? Y Y  Comment has a cataract -  Difficulty concentrating or making decisions? N Y  Walking or climbing stairs? Y N  Dressing or bathing? N N  Doing errands, shopping? N N  Preparing Food and eating ? N -  Using the Toilet? N -  In the past six months, have you accidently leaked urine? Y -  Do you have problems with loss of bowel control? N -  Managing your Medications? N -  Managing your Finances? N -  Housekeeping or managing your Housekeeping? N -  Some recent data might be hidden    Patient Care Team: Valerie Roys, DO as PCP - General (Family Medicine)  Indicate any recent Medical Services you may have received from other than [facility] providers in the past year (date may be approximate).     Assessment:   This is a routine wellness examination for Starlene.  Hearing/Vision screen Vision Screening - Comments:: Regular eye exams, [facility]  Dietary issues and exercise activities discussed: Current Exercise Habits: The patient does not participate in regular exercise at present   Goals Addressed             This Visit's Progress    Patient Stated       02/03/2021, no goals       Depression Screen PHQ  2/9 Scores 02/03/2021  11/27/2020 10/23/2020 02/02/2020 01/16/2020 10/05/2019 09/22/2019  PHQ - 2 Score 0 6 6 0 2 0 2  PHQ- 9 Score - 13 16 0 3 0 9    Fall Risk Fall Risk  02/03/2021 10/23/2020 02/02/2020 01/20/2019 06/01/2018  Falls in the past year? 0 0 0 0 0  Number falls in past yr: - 0 - 0 0  Injury with Fall? - 0 - 0 0  Risk for fall due to : Medication side effect No Fall Risks Medication side effect - -  Follow up Falls evaluation completed;Education provided;Falls prevention discussed Falls evaluation completed Falls evaluation completed;Education provided;Falls prevention discussed - -    FALL RISK PREVENTION PERTAINING TO THE HOME:  Any stairs in or around the home? Yes  If so, are there any without handrails? No  Home free of loose throw rugs in walkways, pet beds, electrical cords, etc? Yes  Adequate lighting in your home to reduce risk of falls? Yes   ASSISTIVE DEVICES UTILIZED TO PREVENT FALLS:  Life alert? No  Use of a cane, walker or w/c? No  Grab bars in the bathroom? No  Shower chair or bench in shower? No  Elevated toilet seat or a handicapped toilet? Yes   TIMED UP AND GO:  Was the test performed? No .       Cognitive Function:     6CIT Screen 02/03/2021 02/02/2020 01/20/2019  What Year? 0 points 0 points 0 points  What month? 0 points 0 points 0 points  What time? 0 points 0 points 0 points  Count back from 20 0 points 0 points 0 points  Months in reverse 0 points 0 points 0 points  Repeat phrase 2 points 0 points 2 points  Total Score 2 0 2    Immunizations Immunization History  Administered Date(s) Administered   Fluad Quad(high Dose 65+) 04/26/2019, 04/23/2020   Influenza, High Dose Seasonal PF 03/23/2017   Influenza-Unspecified 03/26/2015, 03/31/2016, 02/23/2018   PFIZER(Purple Top)SARS-COV-2 Vaccination 07/31/2019, 08/30/2019   Pneumococcal Conjugate-13 03/18/2015   Pneumococcal Polysaccharide-23 03/31/2016   Tdap 12/25/2011    TDAP status: Up to  date  Flu Vaccine status: Due, Education has been provided regarding the importance of this vaccine. Advised may receive this vaccine at local pharmacy or Health Dept. Aware to provide a copy of the vaccination record if obtained from local pharmacy or Health Dept. Verbalized acceptance and understanding.  Pneumococcal vaccine status: Up to date  Covid-19 vaccine status: Completed vaccines  Qualifies for Shingles Vaccine? Yes   Zostavax completed No   Shingrix Completed?: No.    Education has been provided regarding the importance of this vaccine. Patient has been advised to call insurance company to determine out of pocket expense if they have not yet received this vaccine. Advised may also receive vaccine at local pharmacy or Health Dept. Verbalized acceptance and understanding.  Screening Tests Health Maintenance  Topic Date Due   Zoster Vaccines- Shingrix (1 of 2) Never done   INFLUENZA VACCINE  12/23/2020   OPHTHALMOLOGY EXAM  01/15/2021   COVID-19 Vaccine (3 - Booster for Pfizer series) 02/19/2021 (Originally 01/30/2020)   HEMOGLOBIN A1C  03/27/2021   FOOT EXAM  04/23/2021   TETANUS/TDAP  12/24/2021   DEXA SCAN  Completed   Hepatitis C Screening  Completed   PNA vac Low Risk Adult  Completed   HPV VACCINES  Aged Out    Health Maintenance  Health Maintenance Due  Topic Date Due   Zoster Vaccines- Shingrix (  1 of 2) Never done   INFLUENZA VACCINE  12/23/2020   OPHTHALMOLOGY EXAM  01/15/2021    Colorectal cancer screening: Type of screening: Colonoscopy. Completed 09/28/2017. Repeat every 1 years  Mammogram status: No longer required due to age.  Bone Density status: Completed 09/13/2013.   Lung Cancer Screening: (Low Dose CT Chest recommended if Age 25-80 years, 30 pack-year currently smoking OR have quit w/in 15years.) does not qualify.   Lung Cancer Screening Referral: no  Additional Screening:  Hepatitis C Screening: does qualify; Completed 01/16/2020  Vision  Screening: Recommended annual ophthalmology exams for early detection of glaucoma and other disorders of the eye. Is the patient up to date with their annual eye exam?  No  Who is the provider or what is the name of the office in which the patient attends annual eye exams? [facility] If pt is not established with a provider, would they like to be referred to a provider to establish care? No .   Dental Screening: Recommended annual dental exams for proper oral hygiene  Community Resource Referral / Chronic Care Management: CRR required this visit?  No   CCM required this visit?  No      Plan:     I have personally reviewed and noted the following in the patient's chart:   Medical and social history Use of alcohol, tobacco or illicit drugs  Current medications and supplements including opioid prescriptions.  Functional ability and status Nutritional status Physical activity Advanced directives List of other physicians Hospitalizations, surgeries, and ER visits in previous 12 months Vitals Screenings to include cognitive, depression, and falls Referrals and appointments  In addition, I have reviewed and discussed with patient certain preventive protocols, quality metrics, and best practice recommendations. A written personalized care plan for preventive services as well as general preventive health recommendations were provided to patient.     Kellie Simmering, LPN   [id number]   Nurse Notes:

## 2021-02-06 ENCOUNTER — Encounter: Payer: Self-pay | Admitting: Cardiology

## 2021-02-06 ENCOUNTER — Encounter
Admission: RE | Disposition: A | Discharge status: Home / Self Care | Payer: Self-pay | Source: Home / Self Care | Attending: Cardiology

## 2021-02-06 ENCOUNTER — Other Ambulatory Visit: Payer: Self-pay

## 2021-02-06 ENCOUNTER — Ambulatory Visit
Admission: RE | Admit: 2021-02-06 | Discharge: 2021-02-06 | Disposition: A | Discharge status: Home / Self Care | Payer: Medicare HMO | Attending: Cardiology | Admitting: Cardiology

## 2021-02-06 DIAGNOSIS — Z79899 Other long term (current) drug therapy: Secondary | ICD-10-CM | POA: Insufficient documentation

## 2021-02-06 DIAGNOSIS — Z7984 Long term (current) use of oral hypoglycemic drugs: Secondary | ICD-10-CM | POA: Insufficient documentation

## 2021-02-06 DIAGNOSIS — Z886 Allergy status to analgesic agent status: Secondary | ICD-10-CM | POA: Diagnosis not present

## 2021-02-06 DIAGNOSIS — I10 Essential (primary) hypertension: Secondary | ICD-10-CM | POA: Diagnosis not present

## 2021-02-06 DIAGNOSIS — I200 Unstable angina: Secondary | ICD-10-CM

## 2021-02-06 DIAGNOSIS — Z87891 Personal history of nicotine dependence: Secondary | ICD-10-CM | POA: Insufficient documentation

## 2021-02-06 DIAGNOSIS — Z955 Presence of coronary angioplasty implant and graft: Secondary | ICD-10-CM | POA: Insufficient documentation

## 2021-02-06 DIAGNOSIS — I25110 Atherosclerotic heart disease of native coronary artery with unstable angina pectoris: Secondary | ICD-10-CM | POA: Insufficient documentation

## 2021-02-06 DIAGNOSIS — Z7902 Long term (current) use of antithrombotics/antiplatelets: Secondary | ICD-10-CM | POA: Insufficient documentation

## 2021-02-06 DIAGNOSIS — E119 Type 2 diabetes mellitus without complications: Secondary | ICD-10-CM | POA: Insufficient documentation

## 2021-02-06 DIAGNOSIS — I209 Angina pectoris, unspecified: Secondary | ICD-10-CM | POA: Diagnosis not present

## 2021-02-06 HISTORY — PX: LEFT HEART CATH AND CORONARY ANGIOGRAPHY: CATH118249

## 2021-02-06 LAB — GLUCOSE, CAPILLARY: Glucose-Capillary: 115 mg/dL — ABNORMAL HIGH (ref 70–99)

## 2021-02-06 SURGERY — LEFT HEART CATH AND CORONARY ANGIOGRAPHY
Anesthesia: Moderate Sedation

## 2021-02-06 MED ORDER — HEPARIN SODIUM (PORCINE) 1000 UNIT/ML IJ SOLN
INTRAMUSCULAR | Status: AC
  Filled 2021-02-06: qty 1

## 2021-02-06 MED ORDER — FENTANYL CITRATE (PF) 100 MCG/2ML IJ SOLN
INTRAMUSCULAR | Status: DC | PRN
  Administered 2021-02-06: 25 ug via INTRAVENOUS

## 2021-02-06 MED ORDER — FENTANYL CITRATE PF 50 MCG/ML IJ SOSY
PREFILLED_SYRINGE | INTRAMUSCULAR | Status: AC
  Filled 2021-02-06: qty 1

## 2021-02-06 MED ORDER — ONDANSETRON HCL 4 MG/2ML IJ SOLN: 4.0000 mg | Freq: Four times a day (QID) | INTRAMUSCULAR | Status: DC | PRN

## 2021-02-06 MED ORDER — CLOPIDOGREL BISULFATE 75 MG PO TABS: 75.0000 mg | ORAL_TABLET | ORAL | Status: AC

## 2021-02-06 MED ORDER — ACETAMINOPHEN 325 MG PO TABS: 650.0000 mg | ORAL_TABLET | ORAL | Status: DC | PRN

## 2021-02-06 MED ORDER — VERAPAMIL HCL 2.5 MG/ML IV SOLN
INTRAVENOUS | Status: DC | PRN
  Administered 2021-02-06: 2.5 mg via INTRA_ARTERIAL

## 2021-02-06 MED ORDER — SODIUM CHLORIDE 0.9 % IV SOLN: INTRAVENOUS | Status: DC

## 2021-02-06 MED ORDER — SODIUM CHLORIDE 0.9% FLUSH: 3.0000 mL | Freq: Two times a day (BID) | INTRAVENOUS | Status: DC

## 2021-02-06 MED ORDER — HEPARIN SODIUM (PORCINE) 1000 UNIT/ML IJ SOLN
INTRAMUSCULAR | Status: DC | PRN
  Administered 2021-02-06: 4000 [IU] via INTRAVENOUS

## 2021-02-06 MED ORDER — LIDOCAINE HCL 1 % IJ SOLN
INTRAMUSCULAR | Status: AC
  Filled 2021-02-06: qty 20

## 2021-02-06 MED ORDER — HEPARIN (PORCINE) IN NACL 1000-0.9 UT/500ML-% IV SOLN
INTRAVENOUS | Status: DC | PRN
  Administered 2021-02-06 (×2): 500 mL

## 2021-02-06 MED ORDER — VERAPAMIL HCL 2.5 MG/ML IV SOLN
INTRAVENOUS | Status: AC
  Filled 2021-02-06: qty 2

## 2021-02-06 MED ORDER — SODIUM CHLORIDE 0.9% FLUSH: 3.0000 mL | INTRAVENOUS | Status: DC | PRN

## 2021-02-06 MED ORDER — HEPARIN (PORCINE) IN NACL 1000-0.9 UT/500ML-% IV SOLN
INTRAVENOUS | Status: AC
  Filled 2021-02-06: qty 1000

## 2021-02-06 MED ORDER — SODIUM CHLORIDE 0.9 % IV SOLN: 250.0000 mL | INTRAVENOUS | Status: DC | PRN

## 2021-02-06 MED ORDER — METOPROLOL SUCCINATE ER 25 MG PO TB24: 25.0000 mg | ORAL_TABLET | Freq: Every day | ORAL | 11 refills | Status: DC

## 2021-02-06 MED ORDER — CLOPIDOGREL BISULFATE 75 MG PO TABS
ORAL_TABLET | ORAL | Status: AC
  Administered 2021-02-06: 75 mg via ORAL
  Filled 2021-02-06: qty 1

## 2021-02-06 MED ORDER — MIDAZOLAM HCL 2 MG/2ML IJ SOLN
INTRAMUSCULAR | Status: AC
  Filled 2021-02-06: qty 2

## 2021-02-06 MED ORDER — MIDAZOLAM HCL 2 MG/2ML IJ SOLN
INTRAMUSCULAR | Status: DC | PRN
  Administered 2021-02-06: 1 mg via INTRAVENOUS

## 2021-02-06 MED ORDER — LIDOCAINE HCL (PF) 1 % IJ SOLN
INTRAMUSCULAR | Status: DC | PRN
  Administered 2021-02-06: 2 mL

## 2021-02-06 MED ORDER — IOHEXOL 350 MG/ML SOLN
INTRAVENOUS | Status: DC | PRN
  Administered 2021-02-06: 48 mL

## 2021-02-06 SURGICAL SUPPLY — 12 items
CATH INFINITI 5 FR JL3.5 (CATHETERS) ×2 IMPLANT
CATH INFINITI JR4 5F (CATHETERS) ×2 IMPLANT
DEVICE RAD TR BAND REGULAR (VASCULAR PRODUCTS) ×2 IMPLANT
DRAPE BRACHIAL (DRAPES) ×2 IMPLANT
GLIDESHEATH SLEND SS 6F .021 (SHEATH) ×2 IMPLANT
GUIDEWIRE INQWIRE 1.5J.035X260 (WIRE) ×1 IMPLANT
INQWIRE 1.5J .035X260CM (WIRE) ×2
KIT SYRINGE INJ CVI SPIKEX1 (MISCELLANEOUS) ×2 IMPLANT
PACK CARDIAC CATH (CUSTOM PROCEDURE TRAY) ×2 IMPLANT
PROTECTION STATION PRESSURIZED (MISCELLANEOUS) ×2
SET ATX SIMPLICITY (MISCELLANEOUS) ×2 IMPLANT
STATION PROTECTION PRESSURIZED (MISCELLANEOUS) ×1 IMPLANT

## 2021-02-06 NOTE — H&P (Signed)
[facility] History and Physical  Patient ID: Susan Allen MRN: [id number] DOB/AGE: 79-Apr-1942 79 y.o. Admit date: 02/06/2021  Primary Care Physician: Valerie Roys, DO Primary Cardiologist Jordan Hawks, MD  HPI:  Susan Allen is a 80 yo F with h/o CAD (prior PCI to RCA and Lcx), T2DM, HTN who has been having chest discomfort with exertion, as well as with rest. She had a NM stress test which showed borderline inferior ischemia. Given her known h/o CAD she is referred for coronary angiography with possible PCI.    Past Medical History:  Diagnosis Date   Allergy    Arthritis    Bronchitis    Coronary artery disease    Depression    Diabetes mellitus without complication ([place])    GERD (gastroesophageal reflux disease)    Headache    Hypertension    Myocardial infarction [facility]) June 2014, July 2014    Past Surgical History:  Procedure Laterality Date   ABDOMINAL HYSTERECTOMY     APPENDECTOMY     CORONARY ANGIOPLASTY     with stents    DIGIT NAIL REMOVAL Bilateral 08/30/2015   Procedure: REMOVAL OF DIGIT NAILS/ BIL. 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   Procedure: EXCISION MORTON'S NEUROMA;  Surgeon: Albertine Patricia, DPM;  Location: [facility] ORS;  Service: Podiatry;  Laterality: Right;   JOINT REPLACEMENT Bilateral    Total Knee Replacement, Dr Burnis Kingfisher, [place]      Medications Prior to Admission  Medication Sig Dispense Refill Last Dose   cetirizine (ZYRTEC) 10 MG tablet Take 10 mg by mouth daily.   02/06/2021   clopidogrel (PLAVIX) 75 MG tablet Take 1 tablet (75 mg total) by mouth daily. 90 tablet 1 02/05/2021   Coenzyme Q10 100 MG capsule Take 100 mg by mouth daily.   02/06/2021   diclofenac Sodium (VOLTAREN) 1 % GEL APPLY 4 GRAMS TOPICALLY 4 TIMES DAILY (Patient taking differently: Apply 4 g topically 4  (four) times daily as needed (pain).) 100 g 0    fenofibrate (TRICOR) 145 MG tablet Take 1 tablet (145 mg total) by mouth at bedtime. 90 tablet 1 02/05/2021   hydrochlorothiazide (HYDRODIURIL) 25 MG tablet Take 1 tablet (25 mg total) by mouth daily. 90 tablet 1 02/05/2021   isosorbide mononitrate (IMDUR) 60 MG 24 hr tablet Take 60 mg by mouth daily.   02/06/2021   lidocaine (XYLOCAINE) 2 % solution Use as directed 15 mLs in the mouth or throat as needed for mouth pain. 100 mL 0    losartan (COZAAR) 50 MG tablet Take 1 tablet (50 mg total) by mouth daily. 90 tablet 1    metFORMIN (GLUCOPHAGE) 500 MG tablet Take 2 tablets (1,000 mg total) by mouth 2 (two) times daily with a meal. 360 tablet 1 02/05/2021   metoprolol tartrate (LOPRESSOR) 25 MG tablet Take 1.5 tablets (37.5 mg total) by mouth 2 (two) times daily. 270 tablet 1 02/06/2021   nitroGLYCERIN (NITROSTAT) 0.4 MG SL tablet Place 1 tablet (0.4 mg total) under the tongue every 5 (five) minutes as needed for chest pain. 30 tablet 12    omeprazole (PRILOSEC) 20 MG capsule Take 1 capsule (20 mg total) by mouth daily. 90 capsule 1    PARoxetine (PAXIL) 30 MG tablet Take 2 tablets (60 mg total) by mouth at bedtime. 180 tablet 1 02/05/2021  pravastatin (PRAVACHOL) 40 MG tablet Take 1 tablet (40 mg total) by mouth daily. 90 tablet 1 02/05/2021   Simethicone (GAS-X PO) Take 1 tablet by mouth daily as needed (flatulence).      traZODone (DESYREL) 50 MG tablet Take 1 tablet (50 mg total) by mouth at bedtime. 90 tablet 1 02/05/2021   triamcinolone cream (KENALOG) 0.1 % APPLY TO AFFECTED AREA TWICE DAILY (Patient taking differently: Apply 1 application topically daily as needed (irritation).) 90 g 0    Blood Glucose Calibration (OT ULTRA/FASTTK CNTRL SOLN) SOLN See admin instructions.      Blood Glucose Monitoring Suppl (FREESTYLE LITE) DEVI USE TO CHECK GLUCOSE ONCE DAILY 1 each 12    diclofenac sodium (VOLTAREN) 1 % GEL Apply 4 g topically 4 (four) times daily.  (Patient not taking: No sig reported) 100 g 3 Not Taking   Easy Comfort Lancets MISC 3 (three) times daily. for testing      FREESTYLE LITE test strip SMARTSIG:Strip(s) Via Meter Daily      Lancet Devices (EASY MINI EJECT LANCING DEVICE) MISC See admin instructions.      Social History   Socioeconomic History   Marital status: Married    Spouse name: Not on file   Number of children: Not on file   Years of education: Not on file   Highest education level: Not on file  Occupational History   Occupation: retired  Tobacco Use   Smoking status: Former    Packs/day: 0.25    Types: Cigarettes    Quit date: 02/27/2004    Years since quitting: 16.9   Smokeless tobacco: Never  Vaping Use   Vaping Use: Former  Substance and Sexual Activity   Alcohol use: No   Drug use: No   Sexual activity: Not Currently  Other Topics Concern   Not on file  Social History Narrative   Not on file   Social Determinants of Health   Financial Resource Strain: Low Risk    Difficulty of Paying Living Expenses: Not hard at all  Food Insecurity: No Food Insecurity   Worried About Charity fundraiser in the Last Year: Never true   [place] in the Last Year: Never true  Transportation Needs: No Transportation Needs   Lack of Transportation (Medical): No   Lack of Transportation (Non-Medical): No  Physical Activity: Inactive   Days of Exercise per Week: 0 days   Minutes of Exercise per Session: 0 min  Stress: No Stress Concern Present   Feeling of Stress : Not at all  Social Connections: Not on file  Intimate Partner Violence: Not on file    Family History  Problem Relation Age of Onset   Colon cancer Mother 41   Cancer Mother        colon   Cancer Father        lung   Cancer Sister        ovarian   Breast cancer Neg Hx       Review of systems complete and found to be negative unless listed above    Physical Exam:  General: Well developed, well nourished, in no acute  distress HEENT:  Normocephalic and atramatic Neck:  No JVD.  Lungs: Clear bilaterally to auscultation and percussion. Heart: HRRR . Normal S1 and S2 without gallops or murmurs.  Abdomen: Bowel sounds are positive, abdomen soft and non-tender  Msk:  Back normal, normal gait. Normal strength and tone for age. Extremities: No clubbing,  cyanosis or edema.   Neuro: Alert and oriented X 3. Psych:  Good affect, responds appropriately   Labs:   Lab Results  Component Value Date   WBC 6.8 09/24/2020   HGB 12.9 09/24/2020   HCT 40.8 09/24/2020   MCV 87 09/24/2020   PLT CANCELED 09/24/2020   No results for input(s): NA, K, CL, CO2, BUN, CREATININE, CALCIUM, PROT, BILITOT, ALKPHOS, ALT, AST, GLUCOSE in the last 168 hours.  Invalid input(s): LABALBU Lab Results  Component Value Date   CKTOTAL 29 02/18/2013   CKMB 0.8 02/18/2013   TROPONINI < 0.02 02/18/2013    Lab Results  Component Value Date   CHOL 207 (H) 09/24/2020   CHOL 193 04/23/2020   CHOL 167 09/22/2019   Lab Results  Component Value Date   HDL 46 09/24/2020   HDL 52 04/23/2020   HDL 51 09/22/2019   Lab Results  Component Value Date   LDLCALC 131 (H) 09/24/2020   LDLCALC 123 (H) 04/23/2020   LDLCALC 92 09/22/2019   Lab Results  Component Value Date   TRIG 169 (H) 09/24/2020   TRIG 99 04/23/2020   TRIG 135 09/22/2019   No results found for: CHOLHDL No results found for: LDLDIRECT    Radiology: No results found.  EKG: sinus bradycardia. LBBB.  ASSESSMENT AND PLAN:  80 yo F with h/o CAD, HTN, HLD, T2DM who has chest pain and abnormal stress test. She has an aspirin allergy and previously has undergone PCI with monotherapy with Brilinta which will be our strategy if needed. She is currently on plavix. 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 We discussed the risks and benefits of the procedure and we will proceed with LHC with possible intervention. RRA approach.    Signed: Andrez Grime MD 02/06/2021, 7:35 AM

## 2021-02-26 DIAGNOSIS — E782 Mixed hyperlipidemia: Secondary | ICD-10-CM | POA: Diagnosis not present

## 2021-02-26 DIAGNOSIS — I2510 Atherosclerotic heart disease of native coronary artery without angina pectoris: Secondary | ICD-10-CM | POA: Diagnosis not present

## 2021-02-26 DIAGNOSIS — Z955 Presence of coronary angioplasty implant and graft: Secondary | ICD-10-CM | POA: Diagnosis not present

## 2021-03-11 ENCOUNTER — Other Ambulatory Visit: Payer: Self-pay | Admitting: Family Medicine

## 2021-03-11 NOTE — Telephone Encounter (Signed)
Rx- 10/23/20 #90 1 RF ( 6 month supply) Requested Prescriptions  Pending Prescriptions Disp Refills  . pravastatin (PRAVACHOL) 40 MG tablet [Pharmacy Med Name: Pravastatin Sodium 40 MG Oral Tablet] 90 tablet 0    Sig: Take 1 tablet by mouth once daily     Cardiovascular:  Antilipid - Statins Failed - 03/11/2021  9:56 AM      Failed - Total Cholesterol in normal range and within 360 days    Cholesterol, Total  Date Value Ref Range Status  09/24/2020 207 (H) 100 - 199 mg/dL Final   Cholesterol  Date Value Ref Range Status  11/20/2012 235 (H) 0 - 200 mg/dL Final         Failed - LDL in normal range and within 360 days    Ldl Cholesterol, Calc  Date Value Ref Range Status  11/20/2012 144 (H) 0 - 100 mg/dL Final   LDL Chol Calc (NIH)  Date Value Ref Range Status  09/24/2020 131 (H) 0 - 99 mg/dL Final         Failed - Triglycerides in normal range and within 360 days    Triglycerides  Date Value Ref Range Status  09/24/2020 169 (H) 0 - 149 mg/dL Final  11/20/2012 272 (H) 0 - 200 mg/dL Final         Passed - HDL in normal range and within 360 days    HDL Cholesterol  Date Value Ref Range Status  11/20/2012 37 (L) 40 - 60 mg/dL Final   HDL  Date Value Ref Range Status  09/24/2020 46 >39 mg/dL Final         Passed - Patient is not pregnant      Passed - Valid encounter within last 12 months    Recent Outpatient Visits          3 months ago Essential hypertension   [place], Megan P, DO   4 months ago Routine general medical examination at a health care facility   [facility], Megan P, DO   5 months ago Essential hypertension   [place], Megan P, DO   10 months ago Type 2 diabetes mellitus with hyperglycemia, without long-term current use of insulin ([place])   [place], Megan P, DO   1 year ago Type 2 diabetes mellitus with hyperglycemia, without long-term current use of insulin  ([place])   [place], [place], DO      Future Appointments            In 2 weeks Wynetta Emery, Barb Merino, DO [place], [facility]   In 44 months  [facility], [facility]

## 2021-03-13 ENCOUNTER — Other Ambulatory Visit: Payer: Self-pay | Admitting: Family Medicine

## 2021-03-14 NOTE — Telephone Encounter (Signed)
Requested medications are due for refill today yes  Requested medications are on the active medication list yes  Last refill 01/21/21  Last visit 10/23/20  Future visit scheduled 03/31/21  Notes to clinic failed protocol of AST, ALT evaluated within 2 months of initiation of therapy. Please assess.  Requested Prescriptions  Pending Prescriptions Disp Refills   clopidogrel (PLAVIX) 75 MG tablet [Pharmacy Med Name: Clopidogrel Bisulfate 75 MG Oral Tablet] 90 tablet 0    Sig: Take 1 tablet by mouth once daily     Hematology: Antiplatelets - clopidogrel Failed - 03/13/2021  7:28 PM      Failed - Evaluate AST, ALT within 2 months of therapy initiation.      Passed - ALT in normal range and within 360 days    ALT  Date Value Ref Range Status  09/24/2020 7 0 - 32 IU/L Final   SGPT (ALT)  Date Value Ref Range Status  02/17/2013 28 12 - 78 U/L Final          Passed - AST in normal range and within 360 days    AST  Date Value Ref Range Status  09/24/2020 13 0 - 40 IU/L Final   SGOT(AST)  Date Value Ref Range Status  02/17/2013 25 15 - 37 Unit/L Final          Passed - HCT in normal range and within 180 days    Hematocrit  Date Value Ref Range Status  09/24/2020 40.8 34.0 - 46.6 % Final          Passed - HGB in normal range and within 180 days    Hemoglobin  Date Value Ref Range Status  09/24/2020 12.9 11.1 - 15.9 g/dL Final          Passed - PLT in normal range and within 180 days    Platelets  Date Value Ref Range Status  09/24/2020 CANCELED x10E3/uL     Comment:    Unable to perform an accurate platelet count due to aggregation of the platelets.  Result canceled by the ancillary.           Passed - Valid encounter within last 6 months    Recent Outpatient Visits           3 months ago Essential hypertension   [place], Megan P, DO   4 months ago Routine general medical examination at a health care facility   [facility], [place] P, DO   5 months ago Essential hypertension   [place], Megan P, DO   10 months ago Type 2 diabetes mellitus with hyperglycemia, without long-term current use of insulin [facility])   [place], Megan P, DO   1 year ago Type 2 diabetes mellitus with hyperglycemia, without long-term current use of insulin ([place])   [place], [place], DO       Future Appointments             In 2 weeks Johnson, Megan P, DO [place], [facility]   In 10 months  [facility], [facility]            Signed Prescriptions Disp Refills   losartan (COZAAR) 50 MG tablet 90 tablet 0    Sig: Take 1 tablet by mouth once daily     Cardiovascular:  Angiotensin Receptor Blockers Failed - 03/13/2021  7:28 PM      Failed - Last BP  in normal range    BP Readings from Last 1 Encounters:  02/06/21 (!) 157/59          Passed - Cr in normal range and within 180 days    Creatinine  Date Value Ref Range Status  02/19/2013 0.86 0.60 - 1.30 mg/dL Final   Creatinine, Ser  Date Value Ref Range Status  11/27/2020 0.84 0.57 - 1.00 mg/dL Final          Passed - K in normal range and within 180 days    Potassium  Date Value Ref Range Status  11/27/2020 4.3 3.5 - 5.2 mmol/L Final  02/19/2013 4.1 3.5 - 5.1 mmol/L Final          Passed - Patient is not pregnant      Passed - Valid encounter within last 6 months    Recent Outpatient Visits           3 months ago Essential hypertension   [place], Megan P, DO   4 months ago Routine general medical examination at a health care facility   [facility], Megan P, DO   5 months ago Essential hypertension   [place], Megan P, DO   10 months ago Type 2 diabetes mellitus with hyperglycemia, without long-term current use of insulin ([place])   [place], Megan P, DO   1 year ago  Type 2 diabetes mellitus with hyperglycemia, without long-term current use of insulin ([place])   [place], [place], DO       Future Appointments             In 2 weeks Johnson, Megan P, DO [facility], [facility]   In 10 months  [facility], [facility]             fenofibrate (TRICOR) 145 MG tablet 90 tablet 0    Sig: TAKE 1 TABLET BY MOUTH AT BEDTIME     Cardiovascular:  Antilipid - Fibric Acid Derivatives Failed - 03/13/2021  7:28 PM      Failed - Total Cholesterol in normal range and within 360 days    Cholesterol, Total  Date Value Ref Range Status  09/24/2020 207 (H) 100 - 199 mg/dL Final   Cholesterol  Date Value Ref Range Status  11/20/2012 235 (H) 0 - 200 mg/dL Final          Failed - LDL in normal range and within 360 days    Ldl Cholesterol, Calc  Date Value Ref Range Status  11/20/2012 144 (H) 0 - 100 mg/dL Final   LDL Chol Calc (NIH)  Date Value Ref Range Status  09/24/2020 131 (H) 0 - 99 mg/dL Final          Failed - Triglycerides in normal range and within 360 days    Triglycerides  Date Value Ref Range Status  09/24/2020 169 (H) 0 - 149 mg/dL Final  11/20/2012 272 (H) 0 - 200 mg/dL Final          Passed - HDL in normal range and within 360 days    HDL Cholesterol  Date Value Ref Range Status  11/20/2012 37 (L) 40 - 60 mg/dL Final   HDL  Date Value Ref Range Status  09/24/2020 46 >39 mg/dL Final          Passed - ALT in normal range and within 180 days    ALT  Date Value Ref  Range Status  09/24/2020 7 0 - 32 IU/L Final   SGPT (ALT)  Date Value Ref Range Status  02/17/2013 28 12 - 78 U/L Final          Passed - AST in normal range and within 180 days    AST  Date Value Ref Range Status  09/24/2020 13 0 - 40 IU/L Final   SGOT(AST)  Date Value Ref Range Status  02/17/2013 25 15 - 37 Unit/L Final          Passed - Cr in normal range and within 180 days    Creatinine  Date Value Ref Range  Status  02/19/2013 0.86 0.60 - 1.30 mg/dL Final   Creatinine, Ser  Date Value Ref Range Status  11/27/2020 0.84 0.57 - 1.00 mg/dL Final          Passed - eGFR in normal range and within 180 days    EGFR (African American)  Date Value Ref Range Status  02/19/2013 >60  Final   GFR calc Af Amer  Date Value Ref Range Status  04/23/2020 79 >59 mL/min/1.73 Final    Comment:    **In accordance with recommendations from the NKF-ASN Task force,**   [facility] is in the process of updating its eGFR calculation to the   2021 CKD-EPI creatinine equation that estimates kidney function   without a race variable.    EGFR (Non-African Amer.)  Date Value Ref Range Status  02/19/2013 >60  Final    Comment:    eGFR values <48m/min/1.73 m2 may be an indication of chronic kidney disease (CKD). Calculated eGFR is useful in patients with stable renal function. The eGFR calculation will not be reliable in acutely ill patients when serum creatinine is changing rapidly. It is not useful in  patients on dialysis. The eGFR calculation may not be applicable to patients at the low and high extremes of body sizes, pregnant women, and vegetarians.    GFR, Estimated  Date Value Ref Range Status  09/15/2020 >60 >60 mL/min Final    Comment:    (NOTE) Calculated using the CKD-EPI Creatinine Equation (2021)    eGFR  Date Value Ref Range Status  11/27/2020 71 >59 mL/min/1.73 Final          Passed - Valid encounter within last 12 months    Recent Outpatient Visits           3 months ago Essential hypertension   [place]   4 months ago Routine general medical examination at a health care facility   [facility]   5 months ago Essential hypertension   [place]   10 months ago Type 2 diabetes mellitus with hyperglycemia, without long-term current use of insulin ([place]   [place], [place]   1 year ago Type 2 diabetes mellitus with hyperglycemia, without long-term current use of insulin ([facility]   [place] DO       Future Appointments             In 2 weeks JWynetta Emery MBarb Merino DO [place]  In 130 months [facility]

## 2021-03-14 NOTE — Telephone Encounter (Signed)
Requested Prescriptions  Pending Prescriptions Disp Refills  . losartan (COZAAR) 50 MG tablet [Pharmacy Med Name: Losartan Potassium 50 MG Oral Tablet] 90 tablet 0    Sig: Take 1 tablet by mouth once daily     Cardiovascular:  Angiotensin Receptor Blockers Failed - 03/13/2021  7:28 PM      Failed - Last BP in normal range    BP Readings from Last 1 Encounters:  02/06/21 (!) 157/59         Passed - Cr in normal range and within 180 days    Creatinine  Date Value Ref Range Status  02/19/2013 0.86 0.60 - 1.30 mg/dL Final   Creatinine, Ser  Date Value Ref Range Status  11/27/2020 0.84 0.57 - 1.00 mg/dL Final         Passed - K in normal range and within 180 days    Potassium  Date Value Ref Range Status  11/27/2020 4.3 3.5 - 5.2 mmol/L Final  02/19/2013 4.1 3.5 - 5.1 mmol/L Final         Passed - Patient is not pregnant      Passed - Valid encounter within last 6 months    Recent Outpatient Visits          3 months ago Essential hypertension   [place], Megan P, DO   4 months ago Routine general medical examination at a health care facility   [facility], Megan P, DO   5 months ago Essential hypertension   [place], Megan P, DO   10 months ago Type 2 diabetes mellitus with hyperglycemia, without long-term current use of insulin ([place])   [place], Megan P, DO   1 year ago Type 2 diabetes mellitus with hyperglycemia, without long-term current use of insulin ([place])   [place], [place], DO      Future Appointments            In 2 weeks Wynetta Emery, Barb Merino, DO [place], [facility]   In 10 months  [facility], [facility]           . fenofibrate (TRICOR) 145 MG tablet [Pharmacy Med Name: Fenofibrate 145 MG Oral Tablet] 90 tablet 0    Sig: TAKE 1 TABLET BY MOUTH AT BEDTIME     Cardiovascular:  Antilipid - Fibric Acid Derivatives Failed -  03/13/2021  7:28 PM      Failed - Total Cholesterol in normal range and within 360 days    Cholesterol, Total  Date Value Ref Range Status  09/24/2020 207 (H) 100 - 199 mg/dL Final   Cholesterol  Date Value Ref Range Status  11/20/2012 235 (H) 0 - 200 mg/dL Final         Failed - LDL in normal range and within 360 days    Ldl Cholesterol, Calc  Date Value Ref Range Status  11/20/2012 144 (H) 0 - 100 mg/dL Final   LDL Chol Calc (NIH)  Date Value Ref Range Status  09/24/2020 131 (H) 0 - 99 mg/dL Final         Failed - Triglycerides in normal range and within 360 days    Triglycerides  Date Value Ref Range Status  09/24/2020 169 (H) 0 - 149 mg/dL Final  11/20/2012 272 (H) 0 - 200 mg/dL Final         Passed - HDL in normal range and within 360 days  HDL Cholesterol  Date Value Ref Range Status  11/20/2012 37 (L) 40 - 60 mg/dL Final   HDL  Date Value Ref Range Status  09/24/2020 46 >39 mg/dL Final         Passed - ALT in normal range and within 180 days    ALT  Date Value Ref Range Status  09/24/2020 7 0 - 32 IU/L Final   SGPT (ALT)  Date Value Ref Range Status  02/17/2013 28 12 - 78 U/L Final         Passed - AST in normal range and within 180 days    AST  Date Value Ref Range Status  09/24/2020 13 0 - 40 IU/L Final   SGOT(AST)  Date Value Ref Range Status  02/17/2013 25 15 - 37 Unit/L Final         Passed - Cr in normal range and within 180 days    Creatinine  Date Value Ref Range Status  02/19/2013 0.86 0.60 - 1.30 mg/dL Final   Creatinine, Ser  Date Value Ref Range Status  11/27/2020 0.84 0.57 - 1.00 mg/dL Final         Passed - eGFR in normal range and within 180 days    EGFR (African American)  Date Value Ref Range Status  02/19/2013 >60  Final   GFR calc Af Amer  Date Value Ref Range Status  04/23/2020 79 >59 mL/min/1.73 Final    Comment:    **In accordance with recommendations from the NKF-ASN Task force,**   [facility] is in the  process of updating its eGFR calculation to the   2021 CKD-EPI creatinine equation that estimates kidney function   without a race variable.    EGFR (Non-African Amer.)  Date Value Ref Range Status  02/19/2013 >60  Final    Comment:    eGFR values <58m/min/1.73 m2 may be an indication of chronic kidney disease (CKD). Calculated eGFR is useful in patients with stable renal function. The eGFR calculation will not be reliable in acutely ill patients when serum creatinine is changing rapidly. It is not useful in  patients on dialysis. The eGFR calculation may not be applicable to patients at the low and high extremes of body sizes, pregnant women, and vegetarians.    GFR, Estimated  Date Value Ref Range Status  09/15/2020 >60 >60 mL/min Final    Comment:    (NOTE) Calculated using the CKD-EPI Creatinine Equation (2021)    eGFR  Date Value Ref Range Status  11/27/2020 71 >59 mL/min/1.73 Final         Passed - Valid encounter within last 12 months    Recent Outpatient Visits          3 months ago Essential hypertension   [place], [place]   4 months ago Routine general medical examination at a health care facility   [facility]   5 months ago Essential hypertension   [place] P, DO   10 months ago Type 2 diabetes mellitus with hyperglycemia, without long-term current use of insulin ([place]   [place] P, DO   1 year ago Type 2 diabetes mellitus with hyperglycemia, without long-term current use of insulin ([place]   [place] DO      Future Appointments            In 2 weeks Johnson, [place] DO [facility], [facility]   In 57 months  [employer], [place]           . clopidogrel (PLAVIX) 75 MG tablet [Pharmacy Med Name: Clopidogrel Bisulfate 75 MG Oral Tablet] 90 tablet 0    Sig: Take 1 tablet by mouth  once daily     Hematology: Antiplatelets - clopidogrel Failed - 03/13/2021  7:28 PM      Failed - Evaluate AST, ALT within 2 months of therapy initiation.      Passed - ALT in normal range and within 360 days    ALT  Date Value Ref Range Status  09/24/2020 7 0 - 32 IU/L Final   SGPT (ALT)  Date Value Ref Range Status  02/17/2013 28 12 - 78 U/L Final         Passed - AST in normal range and within 360 days    AST  Date Value Ref Range Status  09/24/2020 13 0 - 40 IU/L Final   SGOT(AST)  Date Value Ref Range Status  02/17/2013 25 15 - 37 Unit/L Final         Passed - HCT in normal range and within 180 days    Hematocrit  Date Value Ref Range Status  09/24/2020 40.8 34.0 - 46.6 % Final         Passed - HGB in normal range and within 180 days    Hemoglobin  Date Value Ref Range Status  09/24/2020 12.9 11.1 - 15.9 g/dL Final         Passed - PLT in normal range and within 180 days    Platelets  Date Value Ref Range Status  09/24/2020 CANCELED x10E3/uL     Comment:    Unable to perform an accurate platelet count due to aggregation of the platelets.  Result canceled by the ancillary.          Passed - Valid encounter within last 6 months    Recent Outpatient Visits          3 months ago Essential hypertension   [place], Megan P, DO   4 months ago Routine general medical examination at a health care facility   [facility], [place] P, DO   5 months ago Essential hypertension   [place], Megan P, DO   10 months ago Type 2 diabetes mellitus with hyperglycemia, without long-term current use of insulin [facility])   [place], Megan P, DO   1 year ago Type 2 diabetes mellitus with hyperglycemia, without long-term current use of insulin [facility])   [place], [place], DO      Future Appointments            In 2 weeks Wynetta Emery, Barb Merino, DO [place], [place]   In 87 months  [facility], [facility]

## 2021-03-31 ENCOUNTER — Other Ambulatory Visit: Payer: Self-pay

## 2021-03-31 ENCOUNTER — Encounter: Payer: Self-pay | Admitting: Family Medicine

## 2021-03-31 ENCOUNTER — Ambulatory Visit (INDEPENDENT_AMBULATORY_CARE_PROVIDER_SITE_OTHER): Payer: Medicare HMO | Admitting: Family Medicine

## 2021-03-31 VITALS — BP 119/66 | HR 54 | Temp 98.1°F | Wt 175.0 lb

## 2021-03-31 DIAGNOSIS — E559 Vitamin D deficiency, unspecified: Secondary | ICD-10-CM

## 2021-03-31 DIAGNOSIS — R69 Illness, unspecified: Secondary | ICD-10-CM | POA: Diagnosis not present

## 2021-03-31 DIAGNOSIS — Z23 Encounter for immunization: Secondary | ICD-10-CM | POA: Diagnosis not present

## 2021-03-31 DIAGNOSIS — K2270 Barrett's esophagus without dysplasia: Secondary | ICD-10-CM | POA: Diagnosis not present

## 2021-03-31 DIAGNOSIS — E118 Type 2 diabetes mellitus with unspecified complications: Secondary | ICD-10-CM | POA: Diagnosis not present

## 2021-03-31 DIAGNOSIS — I25118 Atherosclerotic heart disease of native coronary artery with other forms of angina pectoris: Secondary | ICD-10-CM | POA: Diagnosis not present

## 2021-03-31 DIAGNOSIS — E782 Mixed hyperlipidemia: Secondary | ICD-10-CM

## 2021-03-31 DIAGNOSIS — F331 Major depressive disorder, recurrent, moderate: Secondary | ICD-10-CM | POA: Diagnosis not present

## 2021-03-31 DIAGNOSIS — I10 Essential (primary) hypertension: Secondary | ICD-10-CM | POA: Diagnosis not present

## 2021-03-31 LAB — MICROALBUMIN, URINE WAIVED
Creatinine, Urine Waived: 50 mg/dL (ref 10–300)
Microalb, Ur Waived: 10 mg/L (ref 0–19)
Microalb/Creat Ratio: <30 mg/g (ref <30)

## 2021-03-31 LAB — BAYER DCA HB A1C WAIVED: HB A1C (BAYER DCA - WAIVED): 6.1 % — ABNORMAL HIGH (ref 4.8–5.6)

## 2021-03-31 MED ORDER — FENOFIBRATE 145 MG PO TABS: 145.0000 mg | ORAL_TABLET | Freq: Every day | ORAL | 1 refills | Status: DC

## 2021-03-31 MED ORDER — TRAZODONE HCL 50 MG PO TABS: 50.0000 mg | ORAL_TABLET | Freq: Every day | ORAL | 1 refills | Status: DC

## 2021-03-31 MED ORDER — PAROXETINE HCL 30 MG PO TABS: 60.0000 mg | ORAL_TABLET | Freq: Every day | ORAL | 1 refills | Status: DC

## 2021-03-31 MED ORDER — METFORMIN HCL 500 MG PO TABS: 500.0000 mg | ORAL_TABLET | Freq: Two times a day (BID) | ORAL | 1 refills | Status: DC

## 2021-03-31 MED ORDER — LOSARTAN POTASSIUM 50 MG PO TABS: 50.0000 mg | ORAL_TABLET | Freq: Every day | ORAL | 1 refills | Status: DC

## 2021-03-31 MED ORDER — OMEPRAZOLE 40 MG PO CPDR: 40.0000 mg | DELAYED_RELEASE_CAPSULE | Freq: Every day | ORAL | 1 refills | Status: DC

## 2021-03-31 MED ORDER — HYDROCHLOROTHIAZIDE 25 MG PO TABS: 25.0000 mg | ORAL_TABLET | Freq: Every day | ORAL | 1 refills | Status: DC

## 2021-03-31 MED ORDER — CLOPIDOGREL BISULFATE 75 MG PO TABS: 75.0000 mg | ORAL_TABLET | Freq: Every day | ORAL | 3 refills | Status: DC

## 2021-03-31 MED ORDER — PRAVASTATIN SODIUM 40 MG PO TABS: 40.0000 mg | ORAL_TABLET | Freq: Every day | ORAL | 1 refills | Status: DC

## 2021-03-31 NOTE — Assessment & Plan Note (Signed)
Will keep BP and cholesterol under good control. Continue to follow with cardiology. Call with any concerns.  

## 2021-03-31 NOTE — Assessment & Plan Note (Signed)
Under good control on current regimen. Continue current regimen. Continue to monitor. Call with any concerns. Refills given. Labs drawn today.   

## 2021-03-31 NOTE — Assessment & Plan Note (Signed)
Will increase her omeprazole to 40mg . Continue to monitor. Call if not getting better or getting worse.

## 2021-03-31 NOTE — Progress Notes (Signed)
BP 119/66   Pulse (!) 54   Temp 98.1 F (36.7 C)   Wt 175 lb (79.4 kg)   SpO2 97%   BMI 31.00 kg/m    Subjective:    Patient ID: Susan Allen, female    DOB: Nov 20, 1940, 80 y.o.   MRN: [id number]  HPI: Susan Allen is a 80 y.o. female  Chief Complaint  Patient presents with   Diabetes   Hypertension   Depression   DIABETES Hypoglycemic episodes:no Polydipsia/polyuria: no Visual disturbance: no Chest pain: yes Paresthesias: no Glucose Monitoring: yes  Accucheck frequency: Daily Taking Insulin?: no Blood Pressure Monitoring: not checking Retinal Examination: Up to Date Foot Exam: Up to Date Diabetic Education: Completed Pneumovax: Up to Date Influenza: Up to Date Aspirin: no  GERD GERD control status: uncontrolled Satisfied with current treatment? no Heartburn frequency: with eatin Medication side effects: no  Medication compliance: better Dysphagia: no Odynophagia:  no Hematemesis: no Blood in stool: no EGD: no  HYPERTENSION / HYPERLIPIDEMIA Satisfied with current treatment? yes Duration of hypertension: chronic BP monitoring frequency: rarely BP medication side effects: no Past BP meds: metoprolol, imdur, hctz, losartan,  Duration of hyperlipidemia: chronic Cholesterol medication side effects: no Cholesterol supplements: fish oil Past cholesterol medications: pravastatin, fenofibrate Medication compliance: excellent compliance Aspirin: yes Recent stressors: no Recurrent headaches: no Visual changes: no Palpitations: no Dyspnea: no Chest pain: no Lower extremity edema: no Dizzy/lightheaded: no  DEPRESSION Mood status: controlled Satisfied with current treatment?: yes Symptom severity: mild  Duration of current treatment : chronic Side effects: no Medication compliance: excellent compliance Psychotherapy/counseling: no  Previous psychiatric medications: paxil Depressed mood: no Anxious mood: no Anhedonia: no Significant weight loss or  gain: no Insomnia: no  Fatigue: no Feelings of worthlessness or guilt: no Impaired concentration/indecisiveness: no Suicidal ideations: no Hopelessness: no Crying spells: no Depression screen [facility] 2/9 03/31/2021 02/03/2021 11/27/2020 10/23/2020 02/02/2020  Decreased Interest 1 0 3 3 0  Down, Depressed, Hopeless 1 0 3 3 0  PHQ - 2 Score 2 0 6 6 0  Altered sleeping 0 - 0 3 0  Tired, decreased energy 0 - 3 3 0  Change in appetite 0 - 3 3 0  Feeling bad or failure about yourself  0 - 0 0 0  Trouble concentrating 0 - 1 1 0  Moving slowly or fidgety/restless 0 - 0 0 0  Suicidal thoughts 0 - 0 0 0  PHQ-9 Score 2 - 13 16 0  Difficult doing work/chores - - Not difficult at all - Not difficult at all  Some recent data might be hidden     Relevant past medical, surgical, family and social history reviewed and updated as indicated. Interim medical history since our last visit reviewed. Allergies and medications reviewed and updated.  Review of Systems  Constitutional: Negative.   Respiratory: Negative.    Cardiovascular: Negative.   Gastrointestinal: Negative.   Musculoskeletal: Negative.   Neurological: Negative.   Psychiatric/Behavioral: Negative.     Per HPI unless specifically indicated above     Objective:    BP 119/66   Pulse (!) 54   Temp 98.1 F (36.7 C)   Wt 175 lb (79.4 kg)   SpO2 97%   BMI 31.00 kg/m   Wt Readings from Last 3 Encounters:  03/31/21 175 lb (79.4 kg)  02/06/21 175 lb (79.4 kg)  02/03/21 173 lb 6.4 oz (78.7 kg)    Physical Exam Vitals and nursing note reviewed.  Constitutional:  General: She is not in acute distress.    Appearance: Normal appearance. She is not ill-appearing, toxic-appearing or diaphoretic.  HENT:     Head: Normocephalic and atraumatic.     Right Ear: External ear normal.     Left Ear: External ear normal.     Nose: Nose normal.     Mouth/Throat:     Mouth: Mucous membranes are moist.     Pharynx: Oropharynx is clear.  Eyes:      General: No scleral icterus.       Right eye: No discharge.        Left eye: No discharge.     Extraocular Movements: Extraocular movements intact.     Conjunctiva/sclera: Conjunctivae normal.     Pupils: Pupils are equal, round, and reactive to light.  Cardiovascular:     Rate and Rhythm: Normal rate and regular rhythm.     Pulses: Normal pulses.     Heart sounds: Normal heart sounds. No murmur heard.   No friction rub. No gallop.  Pulmonary:     Effort: Pulmonary effort is normal. No respiratory distress.     Breath sounds: Normal breath sounds. No stridor. No wheezing, rhonchi or rales.  Chest:     Chest wall: No tenderness.  Musculoskeletal:        General: Normal range of motion.     Cervical back: Normal range of motion and neck supple.  Skin:    General: Skin is warm and dry.     Capillary Refill: Capillary refill takes less than 2 seconds.     Coloration: Skin is not jaundiced or pale.     Findings: No bruising, erythema, lesion or rash.  Neurological:     General: No focal deficit present.     Mental Status: She is alert and oriented to person, place, and time. Mental status is at baseline.  Psychiatric:        Mood and Affect: Mood normal.        Behavior: Behavior normal.        Thought Content: Thought content normal.        Judgment: Judgment normal.    Results for orders placed or performed during the hospital encounter of 02/06/21  Glucose, capillary  Result Value Ref Range   Glucose-Capillary 115 (H) 70 - 99 mg/dL      Assessment & Plan:   Problem List Items Addressed This Visit       Cardiovascular and Mediastinum   Essential hypertension    Under good control on current regimen. Continue current regimen. Continue to monitor. Call with any concerns. Refills given. Labs drawn today.       Relevant Medications   EPINEPHrine 0.3 mg/0.3 mL IJ SOAJ injection   fenofibrate (TRICOR) 145 MG tablet   hydrochlorothiazide (HYDRODIURIL) 25 MG tablet    losartan (COZAAR) 50 MG tablet   pravastatin (PRAVACHOL) 40 MG tablet   Other Relevant Orders   Comprehensive metabolic panel   CBC with Differential/Platelet   Microalbumin, Urine Waived   Coronary artery disease of native artery of native heart with stable angina pectoris (HCC)    Will keep BP and cholesterol under good control. Continue to follow with cardiology. Call with any concerns.       Relevant Medications   EPINEPHrine 0.3 mg/0.3 mL IJ SOAJ injection   fenofibrate (TRICOR) 145 MG tablet   hydrochlorothiazide (HYDRODIURIL) 25 MG tablet   losartan (COZAAR) 50 MG tablet   PARoxetine (PAXIL) 30 MG  tablet   pravastatin (PRAVACHOL) 40 MG tablet   traZODone (DESYREL) 50 MG tablet   Other Relevant Orders   Comprehensive metabolic panel   CBC with Differential/Platelet     Digestive   Barrett's esophagus    Will increase her omeprazole to 40mg . Continue to monitor. Call if not getting better or getting worse.         Endocrine   Type 2 diabetes mellitus with complication, without long-term current use of insulin ([place]) - Primary    Doing great with A1c of 6.1. Will cut her down to 500mg  metformin BID after the holidays and recheck 6 months. Call with any concerns.       Relevant Medications   losartan (COZAAR) 50 MG tablet   metFORMIN (GLUCOPHAGE) 500 MG tablet   pravastatin (PRAVACHOL) 40 MG tablet   Other Relevant Orders   Comprehensive metabolic panel   CBC with Differential/Platelet   Bayer DCA Hb A1c Waived     Other   Hyperlipidemia    Under good control on current regimen. Continue current regimen. Continue to monitor. Call with any concerns. Refills given. Labs drawn today.       Relevant Medications   EPINEPHrine 0.3 mg/0.3 mL IJ SOAJ injection   fenofibrate (TRICOR) 145 MG tablet   hydrochlorothiazide (HYDRODIURIL) 25 MG tablet   losartan (COZAAR) 50 MG tablet   pravastatin (PRAVACHOL) 40 MG tablet   Other Relevant Orders   Comprehensive metabolic  panel   CBC with Differential/Platelet   Lipid Panel w/o Chol/HDL Ratio   Moderate episode of recurrent major depressive disorder (HCC)    Under good control on current regimen. Continue current regimen. Continue to monitor. Call with any concerns. Refills given.        Relevant Medications   PARoxetine (PAXIL) 30 MG tablet   traZODone (DESYREL) 50 MG tablet   Other Relevant Orders   Comprehensive metabolic panel   CBC with Differential/Platelet   Other Visit Diagnoses     Need for influenza vaccination       Relevant Orders   Flu Vaccine QUAD High Dose(Fluad) (Completed)        Follow up plan: Return in about 6 months (around 09/28/2021), or physical.

## 2021-03-31 NOTE — Addendum Note (Signed)
Addended by: Valerie Roys on: 03/31/2021 10:12 AM   Modules accepted: Orders

## 2021-03-31 NOTE — Assessment & Plan Note (Signed)
Under good control on current regimen. Continue current regimen. Continue to monitor. Call with any concerns. Refills given.   

## 2021-03-31 NOTE — Assessment & Plan Note (Signed)
Doing great with A1c of 6.1. Will cut her down to 500mg  metformin BID after the holidays and recheck 6 months. Call with any concerns.

## 2021-04-01 LAB — CBC WITH DIFFERENTIAL/PLATELET
Basophils Absolute: 0 10*3/uL (ref 0.0–0.2)
Basos: 1 %
EOS (ABSOLUTE): 0.3 10*3/uL (ref 0.0–0.4)
Eos: 5 %
Hematocrit: 37.6 % (ref 34.0–46.6)
Hemoglobin: 12.2 g/dL (ref 11.1–15.9)
Immature Grans (Abs): 0 10*3/uL (ref 0.0–0.1)
Immature Granulocytes: 1 %
Lymphocytes Absolute: 1.9 10*3/uL (ref 0.7–3.1)
Lymphs: 37 %
MCH: 27.9 pg (ref 26.6–33.0)
MCHC: 32.4 g/dL (ref 31.5–35.7)
MCV: 86 fL (ref 79–97)
Monocytes Absolute: 0.6 10*3/uL (ref 0.1–0.9)
Monocytes: 12 %
Neutrophils Absolute: 2.3 10*3/uL (ref 1.4–7.0)
Neutrophils: 44 %
RBC: 4.38 x10E6/uL (ref 3.77–5.28)
RDW: 13.1 % (ref 11.7–15.4)
WBC: 5.1 10*3/uL (ref 3.4–10.8)

## 2021-04-01 LAB — COMPREHENSIVE METABOLIC PANEL
ALT: 8 IU/L (ref 0–32)
AST: 18 IU/L (ref 0–40)
Albumin/Globulin Ratio: 2 (ref 1.2–2.2)
Albumin: 4.1 g/dL (ref 3.7–4.7)
Alkaline Phosphatase: 34 IU/L — ABNORMAL LOW (ref 44–121)
BUN/Creatinine Ratio: 18 (ref 12–28)
BUN: 17 mg/dL (ref 8–27)
Bilirubin Total: 0.2 mg/dL (ref 0.0–1.2)
CO2: 28 mmol/L (ref 20–29)
Calcium: 9.1 mg/dL (ref 8.7–10.3)
Chloride: 92 mmol/L — ABNORMAL LOW (ref 96–106)
Creatinine, Ser: 0.92 mg/dL (ref 0.57–1.00)
Globulin, Total: 2.1 g/dL (ref 1.5–4.5)
Glucose: 114 mg/dL — ABNORMAL HIGH (ref 70–99)
Potassium: 4.7 mmol/L (ref 3.5–5.2)
Sodium: 136 mmol/L (ref 134–144)
Total Protein: 6.2 g/dL (ref 6.0–8.5)
eGFR: 63 mL/min/{1.73_m2} (ref >59)

## 2021-04-01 LAB — LIPID PANEL W/O CHOL/HDL RATIO
Cholesterol, Total: 168 mg/dL (ref 100–199)
HDL: 51 mg/dL (ref >39)
LDL Chol Calc (NIH): 94 mg/dL (ref 0–99)
Triglycerides: 133 mg/dL (ref 0–149)
VLDL Cholesterol Cal: 23 mg/dL (ref 5–40)

## 2021-04-01 LAB — VITAMIN D 25 HYDROXY (VIT D DEFICIENCY, FRACTURES): Vit D, 25-Hydroxy: 22.4 ng/mL — ABNORMAL LOW (ref 30.0–100.0)

## 2021-04-02 DIAGNOSIS — H2511 Age-related nuclear cataract, right eye: Secondary | ICD-10-CM | POA: Diagnosis not present

## 2021-04-02 LAB — HM DIABETES EYE EXAM

## 2021-04-10 DIAGNOSIS — H2511 Age-related nuclear cataract, right eye: Secondary | ICD-10-CM | POA: Diagnosis not present

## 2021-04-14 ENCOUNTER — Encounter: Payer: Self-pay | Admitting: Ophthalmology

## 2021-04-15 NOTE — Discharge Instructions (Signed)

## 2021-04-22 ENCOUNTER — Ambulatory Visit
Admission: RE | Admit: 2021-04-22 | Discharge: 2021-04-22 | Disposition: A | Discharge status: Home / Self Care | Payer: Medicare HMO | Attending: Ophthalmology | Admitting: Ophthalmology

## 2021-04-22 ENCOUNTER — Ambulatory Visit: Payer: Medicare HMO | Admitting: Anesthesiology

## 2021-04-22 ENCOUNTER — Encounter
Admission: RE | Disposition: A | Discharge status: Home / Self Care | Payer: Self-pay | Source: Home / Self Care | Attending: Ophthalmology

## 2021-04-22 ENCOUNTER — Encounter: Payer: Self-pay | Admitting: Ophthalmology

## 2021-04-22 ENCOUNTER — Other Ambulatory Visit: Payer: Self-pay

## 2021-04-22 DIAGNOSIS — Z955 Presence of coronary angioplasty implant and graft: Secondary | ICD-10-CM | POA: Insufficient documentation

## 2021-04-22 DIAGNOSIS — I10 Essential (primary) hypertension: Secondary | ICD-10-CM | POA: Diagnosis not present

## 2021-04-22 DIAGNOSIS — H2511 Age-related nuclear cataract, right eye: Secondary | ICD-10-CM | POA: Insufficient documentation

## 2021-04-22 DIAGNOSIS — I2510 Atherosclerotic heart disease of native coronary artery without angina pectoris: Secondary | ICD-10-CM | POA: Diagnosis not present

## 2021-04-22 DIAGNOSIS — Z87891 Personal history of nicotine dependence: Secondary | ICD-10-CM | POA: Insufficient documentation

## 2021-04-22 DIAGNOSIS — H25811 Combined forms of age-related cataract, right eye: Secondary | ICD-10-CM | POA: Diagnosis not present

## 2021-04-22 DIAGNOSIS — E1136 Type 2 diabetes mellitus with diabetic cataract: Secondary | ICD-10-CM | POA: Insufficient documentation

## 2021-04-22 DIAGNOSIS — I252 Old myocardial infarction: Secondary | ICD-10-CM | POA: Insufficient documentation

## 2021-04-22 HISTORY — PX: CATARACT EXTRACTION W/PHACO: SHX586

## 2021-04-22 LAB — GLUCOSE, CAPILLARY
Glucose-Capillary: 117 mg/dL — ABNORMAL HIGH (ref 70–99)
Glucose-Capillary: 117 mg/dL — ABNORMAL HIGH (ref 70–99)

## 2021-04-22 SURGERY — PHACOEMULSIFICATION, CATARACT, WITH IOL INSERTION
Anesthesia: Monitor Anesthesia Care | Laterality: Right

## 2021-04-22 MED ORDER — SIGHTPATH DOSE#1 BSS IO SOLN
INTRAOCULAR | Status: DC | PRN
  Administered 2021-04-22: 1 mL

## 2021-04-22 MED ORDER — MOXIFLOXACIN HCL 0.5 % OP SOLN
OPHTHALMIC | Status: DC | PRN
  Administered 2021-04-22: 0.2 mL via OPHTHALMIC

## 2021-04-22 MED ORDER — SIGHTPATH DOSE#1 BSS IO SOLN
INTRAOCULAR | Status: DC | PRN
  Administered 2021-04-22: 15 mL

## 2021-04-22 MED ORDER — PHENYLEPHRINE HCL 10 % OP SOLN
1.0000 [drp] | OPHTHALMIC | Status: AC
  Administered 2021-04-22 (×3): 1 [drp] via OPHTHALMIC

## 2021-04-22 MED ORDER — TETRACAINE HCL 0.5 % OP SOLN
1.0000 [drp] | OPHTHALMIC | Status: AC | PRN
  Administered 2021-04-22 (×3): 1 [drp] via OPHTHALMIC

## 2021-04-22 MED ORDER — SIGHTPATH DOSE#1 NA CHONDROIT SULF-NA HYALURON 40-17 MG/ML IO SOLN
INTRAOCULAR | Status: DC | PRN
  Administered 2021-04-22: 1 mL via INTRAOCULAR

## 2021-04-22 MED ORDER — BRIMONIDINE TARTRATE-TIMOLOL 0.2-0.5 % OP SOLN
OPHTHALMIC | Status: DC | PRN
  Administered 2021-04-22: 1 [drp] via OPHTHALMIC

## 2021-04-22 MED ORDER — MIDAZOLAM HCL 2 MG/2ML IJ SOLN
INTRAMUSCULAR | Status: DC | PRN
  Administered 2021-04-22: 1 mg via INTRAVENOUS

## 2021-04-22 MED ORDER — CYCLOPENTOLATE HCL 2 % OP SOLN
1.0000 [drp] | OPHTHALMIC | Status: AC
  Administered 2021-04-22 (×3): 1 [drp] via OPHTHALMIC

## 2021-04-22 MED ORDER — SIGHTPATH DOSE#1 BSS IO SOLN
INTRAOCULAR | Status: DC | PRN
  Administered 2021-04-22: 108 mL via OPHTHALMIC

## 2021-04-22 MED ORDER — FENTANYL CITRATE (PF) 100 MCG/2ML IJ SOLN
INTRAMUSCULAR | Status: DC | PRN
  Administered 2021-04-22: 50 ug via INTRAVENOUS

## 2021-04-22 SURGICAL SUPPLY — 16 items
CANNULA ANT/CHMB 27GA (MISCELLANEOUS) ×2 IMPLANT
DEVICE MILOOP (MISCELLANEOUS) IMPLANT
GLOVE SURG ENC TEXT LTX SZ8 (GLOVE) ×2 IMPLANT
GLOVE SURG TRIUMPH 8.0 PF LTX (GLOVE) ×2 IMPLANT
GOWN STRL REUS W/ TWL LRG LVL3 (GOWN DISPOSABLE) ×2 IMPLANT
GOWN STRL REUS W/TWL LRG LVL3 (GOWN DISPOSABLE) ×4
LENS IOL TECNIS EYHANCE 19.0 (Intraocular Lens) ×2 IMPLANT
MARKER SKIN DUAL TIP RULER LAB (MISCELLANEOUS) ×2 IMPLANT
MILOOP DEVICE (MISCELLANEOUS)
NEEDLE FILTER BLUNT 18X 1/2SAF (NEEDLE) ×1
NEEDLE FILTER BLUNT 18X1 1/2 (NEEDLE) ×1 IMPLANT
PACK EYE AFTER SURG (MISCELLANEOUS) ×2 IMPLANT
SYR 3ML LL SCALE MARK (SYRINGE) ×4 IMPLANT
SYR TB 1ML LUER SLIP (SYRINGE) ×2 IMPLANT
WATER STERILE IRR 250ML POUR (IV SOLUTION) ×2 IMPLANT
WIPE NON LINTING 3.25X3.25 (MISCELLANEOUS) ×2 IMPLANT

## 2021-04-22 NOTE — Op Note (Signed)
PREOPERATIVE DIAGNOSIS:  Nuclear sclerotic cataract of the right eye.   POSTOPERATIVE DIAGNOSIS:  H25.11 Cataract   OPERATIVE PROCEDURE:ORPROCALL@   SURGEON:  Birder Robson, MD.   ANESTHESIA:  Anesthesiologist: [name], MD CRNA: Cameron Ali, CRNA  1.      Managed anesthesia care. 2.      0.63ml of Shugarcaine was instilled in the eye following the paracentesis.   COMPLICATIONS:  None.   TECHNIQUE:   Stop and chop   DESCRIPTION OF PROCEDURE:  The patient was examined and consented in the preoperative holding area where the aforementioned topical anesthesia was applied to the right eye and then brought back to the Operating Room where the right eye was prepped and draped in the usual sterile ophthalmic fashion and a lid speculum was placed. A paracentesis was created with the side port blade and the anterior chamber was filled with viscoelastic. A near clear corneal incision was performed with the steel keratome. A continuous curvilinear capsulorrhexis was performed with a cystotome followed by the capsulorrhexis forceps. Hydrodissection and hydrodelineation were carried out with BSS on a blunt cannula. The lens was removed in a stop and chop  technique and the remaining cortical material was removed with the irrigation-aspiration handpiece. The capsular bag was inflated with viscoelastic and the Technis [id number]  lens was placed in the capsular bag without complication. The remaining viscoelastic was removed from the eye with the irrigation-aspiration handpiece. The wounds were hydrated. The anterior chamber was flushed with BSS and the eye was inflated to physiologic pressure. 0.79ml of Vigamox was placed in the anterior chamber. The wounds were found to be water tight. The eye was dressed with Combigan. The patient was given protective glasses to wear throughout the day and a shield with which to sleep tonight. The patient was also given drops with which to begin a drop regimen today  and will follow-up with me in one day. Implant Name Type Inv. Item Serial No. Manufacturer Lot No. LRB No. Used Action  LENS IOL TECNIS EYHANCE 19.0 - [id number] Intraocular Lens LENS IOL TECNIS EYHANCE 19.0 [id number] JOHNSON   Right 1 Implanted   Procedure(s) with comments: CATARACT EXTRACTION PHACO AND INTRAOCULAR LENS PLACEMENT (IOC) RIGHT DIABETIC 23.38 02:10.8 (Right) - Diabetic  Electronically signed: Birder Robson 04/22/2021 7:56 AM

## 2021-04-22 NOTE — Addendum Note (Signed)
Addendum  created 04/22/21 0829 by [name], MD   Clinical Note Signed

## 2021-04-22 NOTE — Anesthesia Procedure Notes (Signed)
Procedure Name: MAC Date/Time: 04/22/2021 7:33 AM Performed by: Cameron Ali, CRNA Pre-anesthesia Checklist: Patient identified, Emergency Drugs available, Suction available, Timeout performed and Patient being monitored Patient Re-evaluated:Patient Re-evaluated prior to induction Oxygen Delivery Method: Nasal cannula Placement Confirmation: positive ETCO2

## 2021-04-22 NOTE — Anesthesia Postprocedure Evaluation (Addendum)
Anesthesia Post Note  Patient: Susan Allen  Procedure(s) Performed: CATARACT EXTRACTION PHACO AND INTRAOCULAR LENS PLACEMENT (IOC) RIGHT DIABETIC 23.38 02:10.8 (Right)     Patient location during evaluation: PACU Anesthesia Type: MAC Level of consciousness: awake and alert Pain management: pain level controlled Vital Signs Assessment: post-procedure vital signs reviewed and stable Respiratory status: spontaneous breathing, nonlabored ventilation and respiratory function stable Cardiovascular status: stable and blood pressure returned to baseline Postop Assessment: no apparent nausea or vomiting Anesthetic complications: no   No notable events documented.  [name]

## 2021-04-22 NOTE — Anesthesia Preprocedure Evaluation (Signed)
Anesthesia Evaluation  Patient identified by MRN, date of birth, ID band Patient awake    Reviewed: Allergy & Precautions, H&P , NPO status , Patient's Chart, lab work & pertinent test results, reviewed documented beta blocker date and time   Airway Mallampati: II  TM Distance: >3 FB Neck ROM: full    Dental no notable dental hx.    Pulmonary neg pulmonary ROS, former smoker,    Pulmonary exam normal breath sounds clear to auscultation       Cardiovascular Exercise Tolerance: Good hypertension, + CAD, + Past MI and + Cardiac Stents   Rhythm:regular Rate:Normal  02/06/21 [facility] - Conclusion: 1. Non obstructive coronary artery disease including patent stents in the Lcx and RCA (mild ISR). Overall stable in appearance compared to 2017. 2. Normal LVEDP.     Neuro/Psych  Headaches, negative neurological ROS  negative psych ROS   GI/Hepatic negative GI ROS, Neg liver ROS, GERD  ,  Endo/Other  negative endocrine ROSdiabetes  Renal/GU negative Renal ROS  negative genitourinary   Musculoskeletal   Abdominal   Peds  Hematology negative hematology ROS (+)   Anesthesia Other Findings   Reproductive/Obstetrics negative OB ROS                             Anesthesia Physical Anesthesia Plan  ASA: 3  Anesthesia Plan: MAC   Post-op Pain Management:    Induction:   PONV Risk Score and Plan: 2 and TIVA and Treatment may vary due to age or medical condition  Airway Management Planned:   Additional Equipment:   Intra-op Plan:   Post-operative Plan:   Informed Consent: I have reviewed the patients History and Physical, chart, labs and discussed the procedure including the risks, benefits and alternatives for the proposed anesthesia with the patient or authorized representative who has indicated his/her understanding and acceptance.     Dental Advisory Given  Plan Discussed with:  CRNA  Anesthesia Plan Comments:         Anesthesia Quick Evaluation

## 2021-04-22 NOTE — H&P (Signed)
[facility]   Primary Care Physician:  Valerie Roys, DO Ophthalmologist: Dr. Stoney Bang  Pre-Procedure History & Physical: HPI:  Susan Allen is a 80 y.o. female here for cataract surgery.   Past Medical History:  Diagnosis Date   Allergy    Arthritis    Bronchitis    Coronary artery disease    Depression    Diabetes mellitus without complication ([place])    GERD (gastroesophageal reflux disease)    Headache    Hypertension    Myocardial infarction [facility]) June 2014, July 2014    Past Surgical History:  Procedure Laterality Date   APPENDECTOMY     CORONARY ANGIOPLASTY     with stents    DIGIT NAIL REMOVAL Bilateral 08/30/2015   Procedure: REMOVAL OF DIGIT NAILS/ BIL. 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 Authorizing Provider  cetirizine (ZYRTEC) 10 MG tablet Take 10 mg by mouth daily.   Yes [provider]  clopidogrel (PLAVIX) 75 MG tablet Take 1 tablet (75 mg total) by mouth daily. 03/31/21  Yes Johnson, Megan P, DO  Coenzyme Q10 100 MG capsule Take 100 mg by mouth daily.   Yes [provider]  diclofenac Sodium (VOLTAREN) 1 % GEL APPLY 4 GRAMS TOPICALLY 4 TIMES DAILY Patient taking differently: Apply 4 g topically 4 (four) times  daily as needed (pain). 09/20/20  Yes Johnson, Megan P, DO  fenofibrate (TRICOR) 145 MG tablet Take 1 tablet (145 mg total) by mouth at bedtime. 03/31/21  Yes Johnson, Megan P, DO  hydrochlorothiazide (HYDRODIURIL) 25 MG tablet Take 1 tablet (25 mg total) by mouth daily. 03/31/21  Yes Johnson, Megan P, DO  isosorbide mononitrate (IMDUR) 60 MG 24 hr tablet Take 60 mg by mouth daily. 09/13/20  Yes [provider]  lidocaine (XYLOCAINE) 2 % solution Use as directed 15 mLs in the mouth or throat as needed for mouth pain. 09/24/20  Yes Johnson, Megan P, DO  losartan (COZAAR) 50 MG tablet Take 1 tablet (50 mg total) by mouth daily. 03/31/21  Yes Johnson, Megan P, DO  metFORMIN (GLUCOPHAGE) 500 MG tablet Take 1 tablet (500 mg total) by mouth 2 (two) times daily with a meal. 03/31/21  Yes Johnson, Megan P, DO  metoprolol succinate (TOPROL XL) 25 MG 24 hr tablet Take 1 tablet (25 mg total) by mouth daily. 02/06/21 02/06/22 Yes Andrez Grime, MD  omeprazole (PRILOSEC) 40 MG capsule Take 1 capsule (40 mg total) by mouth daily. 03/31/21  Yes Johnson, Megan P, DO  PARoxetine (PAXIL) 30 MG tablet Take 2 tablets (60 mg total) by mouth at bedtime. 03/31/21  Yes Johnson, Megan P, DO  pravastatin (PRAVACHOL) 40 MG tablet Take 1 tablet (40 mg total) by mouth daily. 03/31/21  Yes Park Liter  P, DO  Simethicone (GAS-X PO) Take 1 tablet by mouth daily as needed (flatulence).   Yes [provider]  traZODone (DESYREL) 50 MG tablet Take 1 tablet (50 mg total) by mouth at bedtime. 03/31/21  Yes Johnson, Megan P, DO  triamcinolone cream (KENALOG) 0.1 % APPLY TO AFFECTED AREA TWICE DAILY Patient taking differently: Apply 1 application topically daily as needed (irritation). 09/20/20  Yes Johnson, Megan P, DO  Blood Glucose Calibration (OT ULTRA/FASTTK CNTRL SOLN) SOLN See admin instructions. 09/11/20   [provider]  Blood Glucose Monitoring Suppl (FREESTYLE LITE) DEVI USE TO CHECK GLUCOSE ONCE DAILY  09/03/20   Park Liter P, DO  Easy Comfort Lancets MISC 3 (three) times daily. for testing 09/11/20   [provider]  EPINEPHrine 0.3 mg/0.3 mL IJ SOAJ injection See admin instructions. 10/24/20   [provider]  FREESTYLE LITE test strip SMARTSIG:Strip(s) Via Meter Daily 01/18/20   [provider]  Lancet Devices (EASY MINI EJECT LANCING DEVICE) MISC See admin instructions. 09/11/20   [provider]  nitroGLYCERIN (NITROSTAT) 0.4 MG SL tablet Place 1 tablet (0.4 mg total) under the tongue every 5 (five) minutes as needed for chest pain. 10/23/20   Park Liter P, DO    Allergies as of 04/10/2021 - Review Complete 03/31/2021  Allergen Reaction Noted   Alpha-gal Hives 11/27/2020   Aspirin Hives and Other (See Comments) 08/26/2015   Lisinopril Cough 08/26/2015   Statins Other (See Comments) 04/23/2020    Family History  Problem Relation Age of Onset   Colon cancer Mother 51   Cancer Mother        colon   Cancer Father        lung   Cancer Sister        ovarian   Breast cancer Neg Hx     Social History   Socioeconomic History   Marital status: Married    Spouse name: Not on file   Number of children: Not on file   Years of education: Not on file   Highest education level: Not on file  Occupational History   Occupation: retired  Tobacco Use   Smoking status: Former    Packs/day: 0.25    Types: Cigarettes    Quit date: 02/27/2004    Years since quitting: 17.1   Smokeless tobacco: Never  Vaping Use   Vaping Use: Former  Substance and Sexual Activity   Alcohol use: No   Drug use: No   Sexual activity: Not Currently  Other Topics Concern   Not on file  Social History Narrative   Not on file   Social Determinants of Health   Financial Resource Strain: Low Risk    Difficulty of Paying Living Expenses: Not hard at all  Food Insecurity: No Food Insecurity   Worried About Charity fundraiser in the Last Year: Never true   [place] in the Last Year: Never true  Transportation Needs: No Transportation Needs   Lack of Transportation (Medical): No   Lack of Transportation (Non-Medical): No  Physical Activity: Inactive   Days of Exercise per Week: 0 days   Minutes of Exercise per Session: 0 min  Stress: No Stress Concern Present   Feeling of Stress : Not at all  Social Connections: Not on file  Intimate Partner Violence: Not on file    Review of Systems: See HPI, otherwise negative ROS  Physical Exam: BP (!) 167/58   Pulse 61  Temp (!) 97.2 F (36.2 C) (Temporal)   Resp 16   Ht 5\' 3"  (1.6 m)   Wt 78 kg   SpO2 98%   BMI 30.47 kg/m  General:   Alert, cooperative in NAD Head:  Normocephalic and atraumatic. Respiratory:  Normal work of breathing. Cardiovascular:  RRR  Impression/Plan: Susan Allen is here for cataract surgery.  Risks, benefits, limitations, and alternatives regarding cataract surgery have been reviewed with the patient.  Questions have been answered.  All parties agreeable.   Birder Robson, MD  04/22/2021, 7:17 AM

## 2021-04-22 NOTE — Transfer of Care (Signed)
Immediate Anesthesia Transfer of Care Note  Patient: Susan Allen  Procedure(s) Performed: CATARACT EXTRACTION PHACO AND INTRAOCULAR LENS PLACEMENT (IOC) RIGHT DIABETIC 23.38 02:10.8 (Right)  Patient Location: PACU  Anesthesia Type: MAC  Level of Consciousness: awake, alert  and patient cooperative  Airway and Oxygen Therapy: Patient Spontanous Breathing and Patient connected to supplemental oxygen  Post-op Assessment: Post-op Vital signs reviewed, Patient's Cardiovascular Status Stable, Respiratory Function Stable, Patent Airway and No signs of Nausea or vomiting  Post-op Vital Signs: Reviewed and stable  Complications: No notable events documented.

## 2021-04-23 ENCOUNTER — Encounter: Payer: Self-pay | Admitting: Ophthalmology

## 2021-05-30 DIAGNOSIS — H40003 Preglaucoma, unspecified, bilateral: Secondary | ICD-10-CM | POA: Diagnosis not present

## 2021-05-30 DIAGNOSIS — H2512 Age-related nuclear cataract, left eye: Secondary | ICD-10-CM | POA: Diagnosis not present

## 2021-06-04 ENCOUNTER — Encounter: Payer: Self-pay | Admitting: Ophthalmology

## 2021-06-12 NOTE — Discharge Instructions (Signed)

## 2021-06-17 ENCOUNTER — Ambulatory Visit
Admission: RE | Admit: 2021-06-17 | Discharge: 2021-06-17 | Disposition: A | Discharge status: Home / Self Care | Payer: Medicare HMO | Attending: Ophthalmology | Admitting: Ophthalmology

## 2021-06-17 ENCOUNTER — Encounter
Admission: RE | Disposition: A | Discharge status: Home / Self Care | Payer: Self-pay | Source: Home / Self Care | Attending: Ophthalmology

## 2021-06-17 ENCOUNTER — Other Ambulatory Visit: Payer: Self-pay

## 2021-06-17 ENCOUNTER — Ambulatory Visit: Payer: Medicare HMO | Admitting: Anesthesiology

## 2021-06-17 ENCOUNTER — Encounter: Payer: Self-pay | Admitting: Ophthalmology

## 2021-06-17 DIAGNOSIS — F32A Depression, unspecified: Secondary | ICD-10-CM | POA: Insufficient documentation

## 2021-06-17 DIAGNOSIS — I252 Old myocardial infarction: Secondary | ICD-10-CM | POA: Insufficient documentation

## 2021-06-17 DIAGNOSIS — R69 Illness, unspecified: Secondary | ICD-10-CM | POA: Diagnosis not present

## 2021-06-17 DIAGNOSIS — H2512 Age-related nuclear cataract, left eye: Secondary | ICD-10-CM | POA: Insufficient documentation

## 2021-06-17 DIAGNOSIS — E1136 Type 2 diabetes mellitus with diabetic cataract: Secondary | ICD-10-CM | POA: Diagnosis not present

## 2021-06-17 DIAGNOSIS — H25812 Combined forms of age-related cataract, left eye: Secondary | ICD-10-CM | POA: Diagnosis not present

## 2021-06-17 DIAGNOSIS — I10 Essential (primary) hypertension: Secondary | ICD-10-CM | POA: Diagnosis not present

## 2021-06-17 DIAGNOSIS — Z955 Presence of coronary angioplasty implant and graft: Secondary | ICD-10-CM | POA: Diagnosis not present

## 2021-06-17 DIAGNOSIS — K219 Gastro-esophageal reflux disease without esophagitis: Secondary | ICD-10-CM | POA: Insufficient documentation

## 2021-06-17 DIAGNOSIS — Z87891 Personal history of nicotine dependence: Secondary | ICD-10-CM | POA: Insufficient documentation

## 2021-06-17 DIAGNOSIS — I2510 Atherosclerotic heart disease of native coronary artery without angina pectoris: Secondary | ICD-10-CM | POA: Diagnosis not present

## 2021-06-17 HISTORY — PX: CATARACT EXTRACTION W/PHACO: SHX586

## 2021-06-17 LAB — GLUCOSE, CAPILLARY
Glucose-Capillary: 93 mg/dL (ref 70–99)
Glucose-Capillary: 124 mg/dL — ABNORMAL HIGH (ref 70–99)

## 2021-06-17 SURGERY — PHACOEMULSIFICATION, CATARACT, WITH IOL INSERTION
Anesthesia: Monitor Anesthesia Care | Site: Eye | Laterality: Left

## 2021-06-17 MED ORDER — MOXIFLOXACIN HCL 0.5 % OP SOLN
OPHTHALMIC | Status: DC | PRN
  Administered 2021-06-17: 0.2 mL via OPHTHALMIC

## 2021-06-17 MED ORDER — MIDAZOLAM HCL 2 MG/2ML IJ SOLN
INTRAMUSCULAR | Status: DC | PRN
Start: 2021-06-17 — End: 2021-06-17
  Administered 2021-06-17: 1 mg via INTRAVENOUS

## 2021-06-17 MED ORDER — PHENYLEPHRINE HCL 10 % OP SOLN
1.0000 [drp] | OPHTHALMIC | Status: DC | PRN
  Administered 2021-06-17 (×3): 1 [drp] via OPHTHALMIC

## 2021-06-17 MED ORDER — SIGHTPATH DOSE#1 NA CHONDROIT SULF-NA HYALURON 40-17 MG/ML IO SOLN
INTRAOCULAR | Status: DC | PRN
  Administered 2021-06-17: 1 mL via INTRAOCULAR

## 2021-06-17 MED ORDER — SIGHTPATH DOSE#1 BSS IO SOLN
INTRAOCULAR | Status: DC | PRN
  Administered 2021-06-17: 1 mL via INTRAMUSCULAR

## 2021-06-17 MED ORDER — CYCLOPENTOLATE HCL 2 % OP SOLN
1.0000 [drp] | OPHTHALMIC | Status: DC | PRN
  Administered 2021-06-17 (×3): 1 [drp] via OPHTHALMIC

## 2021-06-17 MED ORDER — SIGHTPATH DOSE#1 BSS IO SOLN
INTRAOCULAR | Status: DC | PRN
  Administered 2021-06-17: 08:00:00 48 mL via OPHTHALMIC

## 2021-06-17 MED ORDER — FENTANYL CITRATE (PF) 100 MCG/2ML IJ SOLN
INTRAMUSCULAR | Status: DC | PRN
Start: 2021-06-17 — End: 2021-06-17
  Administered 2021-06-17: 50 ug via INTRAVENOUS

## 2021-06-17 MED ORDER — SIGHTPATH DOSE#1 BSS IO SOLN
INTRAOCULAR | Status: DC | PRN
Start: 2021-06-17 — End: 2021-06-17
  Administered 2021-06-17: 15 mL

## 2021-06-17 MED ORDER — TETRACAINE HCL 0.5 % OP SOLN
1.0000 [drp] | OPHTHALMIC | Status: DC | PRN
  Administered 2021-06-17 (×3): 1 [drp] via OPHTHALMIC

## 2021-06-17 MED ORDER — BRIMONIDINE TARTRATE-TIMOLOL 0.2-0.5 % OP SOLN
OPHTHALMIC | Status: DC | PRN
  Administered 2021-06-17: 1 [drp] via OPHTHALMIC

## 2021-06-17 SURGICAL SUPPLY — 10 items
CATARACT SUITE SIGHTPATH (MISCELLANEOUS) ×3 IMPLANT
FEE CATARACT SUITE SIGHTPATH (MISCELLANEOUS) ×1 IMPLANT
GLOVE SURG ENC TEXT LTX SZ8 (GLOVE) ×3 IMPLANT
GLOVE SURG TRIUMPH 8.0 PF LTX (GLOVE) ×3 IMPLANT
LENS IOL TECNIS EYHANCE 20.0 (Intraocular Lens) ×2 IMPLANT
NDL FILTER BLUNT 18X1 1/2 (NEEDLE) ×1 IMPLANT
NEEDLE FILTER BLUNT 18X 1/2SAF (NEEDLE) ×2
NEEDLE FILTER BLUNT 18X1 1/2 (NEEDLE) ×1 IMPLANT
SYR 3ML LL SCALE MARK (SYRINGE) ×3 IMPLANT
WATER STERILE IRR 250ML POUR (IV SOLUTION) ×3 IMPLANT

## 2021-06-17 NOTE — H&P (Signed)
[facility]   Primary Care Physician:  Valerie Roys, DO Ophthalmologist: Dr. George Ina  Pre-Procedure History & Physical: HPI:  Susan Allen is a 81 y.o. female here for cataract surgery.   Past Medical History:  Diagnosis Date   Allergy    Arthritis    Bronchitis    Coronary artery disease    Depression    Diabetes mellitus without complication ([place])    GERD (gastroesophageal reflux disease)    Headache    Hypertension    Myocardial infarction [facility]) June 2014, July 2014    Past Surgical History:  Procedure Laterality Date   APPENDECTOMY     CATARACT EXTRACTION W/PHACO Right 04/22/2021   Procedure: CATARACT EXTRACTION PHACO AND INTRAOCULAR LENS PLACEMENT (IOC) RIGHT DIABETIC 23.38 02:10.8;  Surgeon: Birder Robson, MD;  Location: [place];  Service: Ophthalmology;  Laterality: Right;  Diabetic   CORONARY ANGIOPLASTY     with stents    DIGIT NAIL REMOVAL Bilateral 08/30/2015   Procedure: REMOVAL OF DIGIT NAILS/ BIL. 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 Authorizing Provider  cetirizine (ZYRTEC) 10 MG tablet Take 10 mg by mouth daily.   Yes [provider]  clopidogrel (PLAVIX) 75 MG tablet Take 1 tablet (75 mg total) by  mouth daily. 03/31/21  Yes Johnson, Megan P, DO  Coenzyme Q10 100 MG capsule Take 100 mg by mouth daily.   Yes [provider]  fenofibrate (TRICOR) 145 MG tablet Take 1 tablet (145 mg total) by mouth at bedtime. 03/31/21  Yes Johnson, Megan P, DO  hydrochlorothiazide (HYDRODIURIL) 25 MG tablet Take 1 tablet (25 mg total) by mouth daily. 03/31/21  Yes Johnson, Megan P, DO  isosorbide mononitrate (IMDUR) 60 MG 24 hr tablet Take 60 mg by mouth daily. 09/13/20  Yes [provider]  losartan (COZAAR) 50 MG tablet Take 1 tablet (50 mg total) by mouth daily. 03/31/21  Yes Johnson, Megan P, DO  metFORMIN (GLUCOPHAGE) 500 MG tablet Take 1 tablet (500 mg total) by mouth 2 (two) times daily with a meal. 03/31/21  Yes Johnson, Megan P, DO  metoprolol succinate (TOPROL XL) 25 MG 24 hr tablet Take 1 tablet (25 mg total) by mouth daily. 02/06/21 02/06/22 Yes Andrez Grime, MD  omeprazole (PRILOSEC) 40 MG capsule Take 1 capsule (40 mg total) by mouth daily. 03/31/21  Yes Johnson, Megan P, DO  PARoxetine (PAXIL) 30 MG tablet Take 2 tablets (60 mg total) by mouth at bedtime. 03/31/21  Yes Johnson, Megan P, DO  pravastatin (PRAVACHOL) 40 MG tablet Take 1 tablet (40 mg total) by mouth daily. 03/31/21  Yes Johnson, Megan P, DO  Simethicone (GAS-X PO) Take 1 tablet by mouth daily as needed (flatulence).   Yes [provider]  traZODone (  DESYREL) 50 MG tablet Take 1 tablet (50 mg total) by mouth at bedtime. 03/31/21  Yes Johnson, Megan P, DO  Blood Glucose Calibration (OT ULTRA/FASTTK CNTRL SOLN) SOLN See admin instructions. 09/11/20   [provider]  Blood Glucose Monitoring Suppl (FREESTYLE LITE) DEVI USE TO CHECK GLUCOSE ONCE DAILY 09/03/20   Park Liter P, DO  diclofenac Sodium (VOLTAREN) 1 % GEL APPLY 4 GRAMS TOPICALLY 4 TIMES DAILY Patient taking differently: Apply 4 g topically 4 (four) times daily as needed (pain). 09/20/20   Johnson, Megan P, DO  Easy Comfort Lancets MISC 3 (three)  times daily. for testing 09/11/20   [provider]  EPINEPHrine 0.3 mg/0.3 mL IJ SOAJ injection See admin instructions. 10/24/20   [provider]  FREESTYLE LITE test strip SMARTSIG:Strip(s) Via Meter Daily 01/18/20   [provider]  Lancet Devices (EASY MINI EJECT LANCING DEVICE) MISC See admin instructions. 09/11/20   [provider]  lidocaine (XYLOCAINE) 2 % solution Use as directed 15 mLs in the mouth or throat as needed for mouth pain. 09/24/20   Johnson, Megan P, DO  nitroGLYCERIN (NITROSTAT) 0.4 MG SL tablet Place 1 tablet (0.4 mg total) under the tongue every 5 (five) minutes as needed for chest pain. 10/23/20   Johnson, Megan P, DO  triamcinolone cream (KENALOG) 0.1 % APPLY TO AFFECTED AREA TWICE DAILY Patient taking differently: Apply 1 application topically daily as needed (irritation). 09/20/20   Park Liter P, DO    Allergies as of 06/02/2021 - Review Complete 04/22/2021  Allergen Reaction Noted   Alpha-gal Hives 11/27/2020   Aspirin Hives and Other (See Comments) 08/26/2015   Lisinopril Cough 08/26/2015   Statins Other (See Comments) 04/23/2020    Family History  Problem Relation Age of Onset   Colon cancer Mother 98   Cancer Mother        colon   Cancer Father        lung   Cancer Sister        ovarian   Breast cancer Neg Hx     Social History   Socioeconomic History   Marital status: Married    Spouse name: Not on file   Number of children: Not on file   Years of education: Not on file   Highest education level: Not on file  Occupational History   Occupation: retired  Tobacco Use   Smoking status: Former    Packs/day: 0.25    Types: Cigarettes    Quit date: 02/27/2004    Years since quitting: 17.3   Smokeless tobacco: Never  Vaping Use   Vaping Use: Former  Substance and Sexual Activity   Alcohol use: No   Drug use: No   Sexual activity: Not Currently  Other Topics Concern   Not on file  Social History Narrative    Not on file   Social Determinants of Health   Financial Resource Strain: Low Risk    Difficulty of Paying Living Expenses: Not hard at all  Food Insecurity: No Food Insecurity   Worried About Charity fundraiser in the Last Year: Never true   [place] in the Last Year: Never true  Transportation Needs: No Transportation Needs   Lack of Transportation (Medical): No   Lack of Transportation (Non-Medical): No  Physical Activity: Inactive   Days of Exercise per Week: 0 days   Minutes of Exercise per Session: 0 min  Stress: No Stress Concern Present   Feeling of  Stress : Not at all  Social Connections: Not on file  Intimate Partner Violence: Not on file    Review of Systems: See HPI, otherwise negative ROS  Physical Exam: BP (!) 173/62    Pulse (!) 58    Temp 97.7 F (36.5 C) (Temporal)    Wt 80.7 kg    SpO2 100%    BMI 31.53 kg/m  General:   Alert, cooperative in NAD Head:  Normocephalic and atraumatic. Respiratory:  Normal work of breathing. Cardiovascular:  RRR  Impression/Plan: PHEONIX WISBY is here for cataract surgery.  Risks, benefits, limitations, and alternatives regarding cataract surgery have been reviewed with the patient.  Questions have been answered.  All parties agreeable.   Birder Robson, MD  06/17/2021, 7:55 AM

## 2021-06-17 NOTE — Anesthesia Procedure Notes (Signed)
Procedure Name: MAC Date/Time: 06/17/2021 8:11 AM Performed by: Cameron Ali, CRNA Pre-anesthesia Checklist: Patient identified, Emergency Drugs available, Suction available, Timeout performed and Patient being monitored Patient Re-evaluated:Patient Re-evaluated prior to induction Oxygen Delivery Method: Nasal cannula Placement Confirmation: positive ETCO2

## 2021-06-17 NOTE — Transfer of Care (Signed)
Immediate Anesthesia Transfer of Care Note  Patient: Susan Allen  Procedure(s) Performed: CATARACT EXTRACTION PHACO AND INTRAOCULAR LENS PLACEMENT (IOC) LEFT DIABETIC 6.39 00:38.2 (Left: Eye)  Patient Location: PACU  Anesthesia Type: MAC  Level of Consciousness: awake, alert  and patient cooperative  Airway and Oxygen Therapy: Patient Spontanous Breathing and Patient connected to supplemental oxygen  Post-op Assessment: Post-op Vital signs reviewed, Patient's Cardiovascular Status Stable, Respiratory Function Stable, Patent Airway and No signs of Nausea or vomiting  Post-op Vital Signs: Reviewed and stable  Complications: No notable events documented.

## 2021-06-17 NOTE — Anesthesia Preprocedure Evaluation (Signed)
Anesthesia Evaluation  Patient identified by MRN, date of birth, ID band Patient awake    Reviewed: Allergy & Precautions, H&P , NPO status   Airway Mallampati: II  TM Distance: >3 FB Neck ROM: full    Dental no notable dental hx.    Pulmonary former smoker,    Pulmonary exam normal breath sounds clear to auscultation       Cardiovascular Exercise Tolerance: Good hypertension, + CAD, + Past MI and + Cardiac Stents   Rhythm:regular Rate:Normal  02/06/21 LHC - Conclusion: 1. Non obstructive coronary artery disease including patent stents in the Lcx and RCA (mild ISR). Overall stable in appearance compared to 2017. 2. Normal LVEDP.     Neuro/Psych  Headaches, Depression    GI/Hepatic GERD  ,  Endo/Other  diabetes  Renal/GU      Musculoskeletal  (+) Arthritis ,   Abdominal   Peds  Hematology   Anesthesia Other Findings   Reproductive/Obstetrics                             Anesthesia Physical  Anesthesia Plan  ASA: 3  Anesthesia Plan: MAC   Post-op Pain Management:    Induction:   PONV Risk Score and Plan: 2 and TIVA and Treatment may vary due to age or medical condition  Airway Management Planned:   Additional Equipment:   Intra-op Plan:   Post-operative Plan:   Informed Consent: I have reviewed the patients History and Physical, chart, labs and discussed the procedure including the risks, benefits and alternatives for the proposed anesthesia with the patient or authorized representative who has indicated his/her understanding and acceptance.     Dental Advisory Given  Plan Discussed with: CRNA  Anesthesia Plan Comments:         Anesthesia Quick Evaluation

## 2021-06-17 NOTE — Op Note (Signed)
PREOPERATIVE DIAGNOSIS:  Nuclear sclerotic cataract of the left eye.   POSTOPERATIVE DIAGNOSIS:  Nuclear sclerotic cataract of the left eye.   OPERATIVE PROCEDURE:ORPROCALL@   SURGEON:  Birder Robson, MD.   ANESTHESIA:  Anesthesiologist: Veda Canning, MD CRNA: Cameron Ali, CRNA  1.      Managed anesthesia care. 2.     0.49ml of Shugarcaine was instilled following the paracentesis   COMPLICATIONS:  None.   TECHNIQUE:   Stop and chop   DESCRIPTION OF PROCEDURE:  The patient was examined and consented in the preoperative holding area where the aforementioned topical anesthesia was applied to the left eye and then brought back to the Operating Room where the left eye was prepped and draped in the usual sterile ophthalmic fashion and a lid speculum was placed. A paracentesis was created with the side port blade and the anterior chamber was filled with viscoelastic. A near clear corneal incision was performed with the steel keratome. A continuous curvilinear capsulorrhexis was performed with a cystotome followed by the capsulorrhexis forceps. Hydrodissection and hydrodelineation were carried out with BSS on a blunt cannula. The lens was removed in a stop and chop  technique and the remaining cortical material was removed with the irrigation-aspiration handpiece. The capsular bag was inflated with viscoelastic and the Technis [id number] lens was placed in the capsular bag without complication. The remaining viscoelastic was removed from the eye with the irrigation-aspiration handpiece. The wounds were hydrated. The anterior chamber was flushed with BSS and the eye was inflated to physiologic pressure. 0.59ml Vigamox was placed in the anterior chamber. The wounds were found to be water tight. The eye was dressed with Combigan. The patient was given protective glasses to wear throughout the day and a shield with which to sleep tonight. The patient was also given drops with which to begin a drop regimen  today and will follow-up with me in one day. Implant Name Type Inv. Item Serial No. Manufacturer Lot No. LRB No. Used Action  LENS IOL TECNIS EYHANCE 20.0 - [id number] Intraocular Lens LENS IOL TECNIS EYHANCE 20.0 [id number] [employer]  Left 1 Implanted    Procedure(s): CATARACT EXTRACTION PHACO AND INTRAOCULAR LENS PLACEMENT (IOC) LEFT DIABETIC 6.39 00:38.2 (Left)  Electronically signed: Birder Robson 06/17/2021 8:17 AM

## 2021-06-17 NOTE — Anesthesia Postprocedure Evaluation (Signed)
Anesthesia Post Note  Patient: Susan Allen  Procedure(s) Performed: CATARACT EXTRACTION PHACO AND INTRAOCULAR LENS PLACEMENT (IOC) LEFT DIABETIC 6.39 00:38.2 (Left: Eye)     Patient location during evaluation: PACU Anesthesia Type: MAC Level of consciousness: awake Pain management: pain level controlled Vital Signs Assessment: post-procedure vital signs reviewed and stable Respiratory status: respiratory function stable Cardiovascular status: stable Postop Assessment: no apparent nausea or vomiting Anesthetic complications: no   No notable events documented.  Veda Canning

## 2021-06-18 ENCOUNTER — Encounter: Payer: Self-pay | Admitting: Ophthalmology

## 2021-08-01 DIAGNOSIS — Z0100 Encounter for examination of eyes and vision without abnormal findings: Secondary | ICD-10-CM | POA: Diagnosis not present

## 2021-09-29 ENCOUNTER — Encounter: Payer: Medicare HMO | Admitting: Family Medicine

## 2021-10-13 ENCOUNTER — Other Ambulatory Visit: Payer: Self-pay | Admitting: Family Medicine

## 2021-10-14 NOTE — Telephone Encounter (Signed)
90 day courtesy RF Requested Prescriptions  Pending Prescriptions Disp Refills  . PARoxetine (PAXIL) 30 MG tablet [Pharmacy Med Name: PARoxetine HCl 30 MG Oral Tablet] 180 tablet 0    Sig: TAKE 2 TABLETS BY MOUTH AT BEDTIME     Psychiatry:  Antidepressants - SSRI Failed - 10/13/2021 10:51 AM      Failed - Valid encounter within last 6 months    Recent Outpatient Visits          6 months ago Type 2 diabetes mellitus with complication, without long-term current use of insulin ([place])   [facility], Megan P, DO   10 months ago Essential hypertension   [place], Megan P, DO   11 months ago Routine general medical examination at a health care facility   [facility], [place], DO   1 year ago Essential hypertension   [place], [place] P, DO   1 year ago Type 2 diabetes mellitus with hyperglycemia, without long-term current use of insulin ([place])   [place], [place], DO      Future Appointments            In 3 weeks Wynetta Emery, Megan P, DO [place], [facility]   In 3 months  [facility], [facility]           Passed - Completed PHQ-2 or PHQ-9 in the last 360 days      . metFORMIN (GLUCOPHAGE) 500 MG tablet [Pharmacy Med Name: metFORMIN HCl 500 MG Oral Tablet] 360 tablet 0    Sig: TAKE 2 TABLETS BY MOUTH TWICE DAILY WITH A MEAL     Endocrinology:  Diabetes - Biguanides Failed - 10/13/2021 10:51 AM      Failed - HBA1C is between 0 and 7.9 and within 180 days    Hemoglobin A1C  Date Value Ref Range Status  05/03/2018 7.1  Final   HB A1C (BAYER DCA - WAIVED)  Date Value Ref Range Status  03/31/2021 6.1 (H) 4.8 - 5.6 % Final    Comment:             Prediabetes: 5.7 - 6.4          Diabetes: >6.4          Glycemic control for adults with diabetes: <7.0          Failed - B12 Level in normal range and within 720 days    No results found for: VITAMINB12        Failed - Valid encounter within last 6 months    Recent Outpatient Visits          6 months ago Type 2 diabetes mellitus with complication, without long-term current use of insulin ([place])   [place], Megan P, DO   10 months ago Essential hypertension   [facility] [place], Megan P, DO   11 months ago Routine general medical examination at a health care facility   [place], [place], DO   1 year ago Essential hypertension   [place], [place]on P, DO   1 year ago Type 2 diabetes mellitus with hyperglycemia, without long-term current use of insulin [facility])   [place], [place], DO      Future Appointments            In 3 weeks Wynetta Emery, Barb Merino, DO [employer] [facility], [facility]  In 3 months  [place], [facility]           Passed - Cr in normal range and within 360 days    Creatinine  Date Value Ref Range Status  02/19/2013 0.86 0.60 - 1.30 mg/dL Final   Creatinine, Ser  Date Value Ref Range Status  03/31/2021 0.92 0.57 - 1.00 mg/dL Final         Passed - eGFR in normal range and within 360 days    EGFR (African American)  Date Value Ref Range Status  02/19/2013 >60  Final   GFR calc Af Amer  Date Value Ref Range Status  04/23/2020 79 >59 mL/min/1.73 Final    Comment:    **In accordance with recommendations from the NKF-ASN Task force,**   [facility] is in the process of updating its eGFR calculation to the   2021 CKD-EPI creatinine equation that estimates kidney function   without a race variable.    EGFR (Non-African Amer.)  Date Value Ref Range Status  02/19/2013 >60  Final    Comment:    eGFR values <20m/min/1.73 m2 may be an indication of chronic kidney disease (CKD). Calculated eGFR is useful in patients with stable renal function. The eGFR calculation will not be reliable in acutely ill patients when serum creatinine is changing rapidly. It is not  useful in  patients on dialysis. The eGFR calculation may not be applicable to patients at the low and high extremes of body sizes, pregnant women, and vegetarians.    GFR, Estimated  Date Value Ref Range Status  09/15/2020 >60 >60 mL/min Final    Comment:    (NOTE) Calculated using the CKD-EPI Creatinine Equation (2021)    eGFR  Date Value Ref Range Status  03/31/2021 63 >59 mL/min/1.73 Final         Passed - CBC within normal limits and completed in the last 12 months    WBC  Date Value Ref Range Status  03/31/2021 5.1 3.4 - 10.8 x10E3/uL Final  09/15/2020 10.0 4.0 - 10.5 K/uL Final   RBC  Date Value Ref Range Status  03/31/2021 4.38 3.77 - 5.28 x10E6/uL Final    Comment:    Elliptocytes present.  09/15/2020 4.46 3.87 - 5.11 MIL/uL Final   Hemoglobin  Date Value Ref Range Status  03/31/2021 12.2 11.1 - 15.9 g/dL Final   Hematocrit  Date Value Ref Range Status  03/31/2021 37.6 34.0 - 46.6 % Final   MCHC  Date Value Ref Range Status  03/31/2021 32.4 31.5 - 35.7 g/dL Final  09/15/2020 33.2 30.0 - 36.0 g/dL Final   [facility] Date Value Ref Range Status  03/31/2021 27.9 26.6 - 33.0 pg Final  09/15/2020 28.3 26.0 - 34.0 pg Final   MCV  Date Value Ref Range Status  03/31/2021 86 79 - 97 fL Final  02/19/2013 84 80 - 100 fL Final   No results found for: PLTCOUNTKUC, LABPLAT, POCPLA RDW  Date Value Ref Range Status  03/31/2021 13.1 11.7 - 15.4 % Final  02/19/2013 13.9 11.5 - 14.5 % Final         . traZODone (DESYREL) 50 MG tablet [Pharmacy Med Name: traZODone HCl 50 MG Oral Tablet] 90 tablet 0    Sig: TAKE 1 TABLET BY MOUTH AT BEDTIME     Psychiatry: Antidepressants - Serotonin Modulator Failed - 10/13/2021 10:51 AM      Failed - Valid encounter within last 6 months    Recent Outpatient Visits  6 months ago Type 2 diabetes mellitus with complication, without long-term current use of insulin ([place])   [place], Megan P, DO   10  months ago Essential hypertension   [place], [place], DO   11 months ago Routine general medical examination at a health care facility   [facility], [place], DO   1 year ago Essential hypertension   [place], [place] P, DO   1 year ago Type 2 diabetes mellitus with hyperglycemia, without long-term current use of insulin ([place])   [place], [place], DO      Future Appointments            In 3 weeks Wynetta Emery, Megan P, DO [place], [facility]   In 3 months  [facility], [facility]           Passed - Completed PHQ-2 or PHQ-9 in the last 360 days      . pravastatin (PRAVACHOL) 40 MG tablet [Pharmacy Med Name: Pravastatin Sodium 40 MG Oral Tablet] 90 tablet 0    Sig: Take 1 tablet by mouth once daily     Cardiovascular:  Antilipid - Statins Failed - 10/13/2021 10:51 AM      Failed - Lipid Panel in normal range within the last 12 months    Cholesterol, Total  Date Value Ref Range Status  03/31/2021 168 100 - 199 mg/dL Final   Cholesterol  Date Value Ref Range Status  11/20/2012 235 (H) 0 - 200 mg/dL Final   Ldl Cholesterol, Calc  Date Value Ref Range Status  11/20/2012 144 (H) 0 - 100 mg/dL Final   LDL Chol Calc (NIH)  Date Value Ref Range Status  03/31/2021 94 0 - 99 mg/dL Final   HDL Cholesterol  Date Value Ref Range Status  11/20/2012 37 (L) 40 - 60 mg/dL Final   HDL  Date Value Ref Range Status  03/31/2021 51 >39 mg/dL Final   Triglycerides  Date Value Ref Range Status  03/31/2021 133 0 - 149 mg/dL Final  11/20/2012 272 (H) 0 - 200 mg/dL Final         Passed - Patient is not pregnant      Passed - Valid encounter within last 12 months    Recent Outpatient Visits          6 months ago Type 2 diabetes mellitus with complication, without long-term current use of insulin ([place])   [place], Megan P, DO   10 months ago Essential  hypertension   [facility] [place], Megan P, DO   11 months ago Routine general medical examination at a health care facility   [place], [place], DO   1 year ago Essential hypertension   [place], [place] P, DO   1 year ago Type 2 diabetes mellitus with hyperglycemia, without long-term current use of insulin ([place])   [place], [place], DO      Future Appointments            In 3 weeks Wynetta Emery, Barb Merino, DO [place], [facility]   In 3 months  [facility], [facility]           . omeprazole (PRILOSEC) 40 MG capsule [Pharmacy Med Name: Omeprazole 40 MG Oral Capsule Delayed Release] 90 capsule 0    Sig: Take 1 capsule by mouth once daily  Gastroenterology: Proton Pump Inhibitors Passed - 10/13/2021 10:51 AM      Passed - Valid encounter within last 12 months    Recent Outpatient Visits          6 months ago Type 2 diabetes mellitus with complication, without long-term current use of insulin ([place])   [facility], Megan P, DO   10 months ago Essential hypertension   [place], Megan P, DO   11 months ago Routine general medical examination at a health care facility   [facility], [place], DO   1 year ago Essential hypertension   [place], [place] P, DO   1 year ago Type 2 diabetes mellitus with hyperglycemia, without long-term current use of insulin [facility])   [place], [place], DO      Future Appointments            In 3 weeks Wynetta Emery, Barb Merino, DO [place], [facility]   In 3 months  [facility], [facility]

## 2021-11-04 ENCOUNTER — Encounter: Payer: Self-pay | Admitting: Family Medicine

## 2021-11-04 ENCOUNTER — Ambulatory Visit (INDEPENDENT_AMBULATORY_CARE_PROVIDER_SITE_OTHER): Payer: Medicare HMO | Admitting: Family Medicine

## 2021-11-04 VITALS — BP 135/65 | HR 53 | Temp 97.9°F | Wt 174.6 lb

## 2021-11-04 DIAGNOSIS — Z23 Encounter for immunization: Secondary | ICD-10-CM

## 2021-11-04 DIAGNOSIS — E559 Vitamin D deficiency, unspecified: Secondary | ICD-10-CM

## 2021-11-04 DIAGNOSIS — D692 Other nonthrombocytopenic purpura: Secondary | ICD-10-CM | POA: Insufficient documentation

## 2021-11-04 DIAGNOSIS — E118 Type 2 diabetes mellitus with unspecified complications: Secondary | ICD-10-CM | POA: Diagnosis not present

## 2021-11-04 DIAGNOSIS — I10 Essential (primary) hypertension: Secondary | ICD-10-CM | POA: Diagnosis not present

## 2021-11-04 DIAGNOSIS — Z13820 Encounter for screening for osteoporosis: Secondary | ICD-10-CM | POA: Diagnosis not present

## 2021-11-04 DIAGNOSIS — S51802A Unspecified open wound of left forearm, initial encounter: Secondary | ICD-10-CM | POA: Diagnosis not present

## 2021-11-04 DIAGNOSIS — Z0000 Encounter for general adult medical examination without abnormal findings: Secondary | ICD-10-CM

## 2021-11-04 DIAGNOSIS — F331 Major depressive disorder, recurrent, moderate: Secondary | ICD-10-CM

## 2021-11-04 DIAGNOSIS — E782 Mixed hyperlipidemia: Secondary | ICD-10-CM

## 2021-11-04 DIAGNOSIS — I25118 Atherosclerotic heart disease of native coronary artery with other forms of angina pectoris: Secondary | ICD-10-CM | POA: Diagnosis not present

## 2021-11-04 DIAGNOSIS — R69 Illness, unspecified: Secondary | ICD-10-CM | POA: Diagnosis not present

## 2021-11-04 LAB — URINALYSIS, ROUTINE W REFLEX MICROSCOPIC
Bilirubin, UA: NEGATIVE
Glucose, UA: NEGATIVE
Ketones, UA: NEGATIVE
Leukocytes,UA: NEGATIVE
Nitrite, UA: NEGATIVE
Protein,UA: NEGATIVE
RBC, UA: NEGATIVE
Specific Gravity, UA: 1.02 (ref 1.005–1.030)
Urobilinogen, Ur: 1 mg/dL (ref 0.2–1.0)
pH, UA: 7 (ref 5.0–7.5)

## 2021-11-04 LAB — MICROALBUMIN, URINE WAIVED
Creatinine, Urine Waived: 100 mg/dL (ref 10–300)
Microalb, Ur Waived: 10 mg/L (ref 0–19)
Microalb/Creat Ratio: <30 mg/g (ref <30)

## 2021-11-04 LAB — BAYER DCA HB A1C WAIVED: HB A1C (BAYER DCA - WAIVED): 6.1 % — ABNORMAL HIGH (ref 4.8–5.6)

## 2021-11-04 MED ORDER — TRAZODONE HCL 50 MG PO TABS: 50.0000 mg | ORAL_TABLET | Freq: Every day | ORAL | 1 refills | Status: DC

## 2021-11-04 MED ORDER — NITROGLYCERIN 0.4 MG SL SUBL: 0.4000 mg | SUBLINGUAL_TABLET | SUBLINGUAL | 12 refills | Status: AC | PRN

## 2021-11-04 MED ORDER — PAROXETINE HCL 30 MG PO TABS: 60.0000 mg | ORAL_TABLET | Freq: Every day | ORAL | 1 refills | Status: DC

## 2021-11-04 MED ORDER — METOPROLOL SUCCINATE ER 25 MG PO TB24: 25.0000 mg | ORAL_TABLET | Freq: Every day | ORAL | 1 refills | Status: DC

## 2021-11-04 MED ORDER — TRIAMCINOLONE ACETONIDE 0.1 % EX CREA: 1.0000 "application " | TOPICAL_CREAM | Freq: Every day | CUTANEOUS | 2 refills | Status: AC | PRN

## 2021-11-04 MED ORDER — ISOSORBIDE MONONITRATE ER 60 MG PO TB24: 60.0000 mg | ORAL_TABLET | Freq: Every day | ORAL | 1 refills | Status: DC

## 2021-11-04 MED ORDER — OMEPRAZOLE 40 MG PO CPDR: 40.0000 mg | DELAYED_RELEASE_CAPSULE | Freq: Every day | ORAL | 1 refills | Status: DC

## 2021-11-04 MED ORDER — FENOFIBRATE 145 MG PO TABS: 145.0000 mg | ORAL_TABLET | Freq: Every day | ORAL | 1 refills | Status: DC

## 2021-11-04 MED ORDER — LOSARTAN POTASSIUM 50 MG PO TABS: 50.0000 mg | ORAL_TABLET | Freq: Every day | ORAL | 1 refills | Status: DC

## 2021-11-04 MED ORDER — HYDROCHLOROTHIAZIDE 25 MG PO TABS: 25.0000 mg | ORAL_TABLET | Freq: Every day | ORAL | 1 refills | Status: DC

## 2021-11-04 MED ORDER — CLOPIDOGREL BISULFATE 75 MG PO TABS: 75.0000 mg | ORAL_TABLET | Freq: Every day | ORAL | 3 refills | Status: DC

## 2021-11-04 MED ORDER — METFORMIN HCL 500 MG PO TABS: ORAL_TABLET | ORAL | 1 refills | Status: DC

## 2021-11-04 MED ORDER — PRAVASTATIN SODIUM 40 MG PO TABS: 40.0000 mg | ORAL_TABLET | ORAL | 1 refills | Status: DC

## 2021-11-04 NOTE — Patient Instructions (Signed)
Please call to schedule your mammogram and/or bone density: ?[facility]  ?Address: [address], [place], [place] [address] ?Phone: [phone]  ?

## 2021-11-04 NOTE — Assessment & Plan Note (Signed)
Under good control on current regimen. Continue current regimen. Continue to monitor. Call with any concerns. Refills given. Labs drawn today.   

## 2021-11-04 NOTE — Assessment & Plan Note (Signed)
Reassured patient. Continue to monitor. Call with any concerns.  

## 2021-11-04 NOTE — Assessment & Plan Note (Signed)
Doing well with A1c of 6.1. Continue current regimen. Continue to monitor. Call with any concerns. Refills given.

## 2021-11-04 NOTE — Progress Notes (Signed)
BP 135/65   Pulse (!) 53   Temp 97.9 F (36.6 C)   Wt 174 lb 9.6 oz (79.2 kg)   SpO2 96%   BMI 30.93 kg/m    Subjective:    Patient ID: Susan Allen, female    DOB: Feb 09, 1941, 81 y.o.   MRN: [id number]  HPI: Susan Allen is a 81 y.o. female presenting on 11/04/2021 for comprehensive medical examination. Current medical complaints include:  HYPERTENSION / HYPERLIPIDEMIA Satisfied with current treatment? yes Duration of hypertension: chronic BP monitoring frequency: not checking BP medication side effects: no Past BP meds: hctz, imdur, losartan, metoprolol Duration of hyperlipidemia: chronic Cholesterol medication side effects: no Cholesterol supplements: none Past cholesterol medications: pravastatin, fenofibrate Medication compliance: excellent compliance Aspirin: no Recent stressors: no Recurrent headaches: no Visual changes: no Palpitations: no Dyspnea: no Chest pain: no Lower extremity edema: no Dizzy/lightheaded: no  DIABETES Hypoglycemic episodes:no Polydipsia/polyuria: yes Visual disturbance: no Chest pain: no Paresthesias: no Glucose Monitoring: no  Accucheck frequency: Not Checking Taking Insulin?: no Blood Pressure Monitoring: not checking Retinal Examination: Up to Date Foot Exam: Up to Date Diabetic Education: Completed Pneumovax: Up to Date Influenza: Up to Date Aspirin: no  DEPRESSION Mood status: controlled Satisfied with current treatment?: yes Symptom severity: mild  Duration of current treatment : chronic Side effects: no Medication compliance: excellent compliance Psychotherapy/counseling: no  Previous psychiatric medications: paxil Depressed mood: no Anxious mood: no Anhedonia: no Significant weight loss or gain: no Insomnia: no  Fatigue: no Feelings of worthlessness or guilt: no Impaired concentration/indecisiveness: no Suicidal ideations: no Hopelessness: no Crying spells: no    11/04/2021   10:39 AM 03/31/2021    8:37 AM  02/03/2021   10:37 AM 11/27/2020    9:48 AM 10/23/2020    8:19 AM  Depression screen PHQ 2/9  Decreased Interest 3 1 0 3 3  Down, Depressed, Hopeless 0 1 0 3 3  PHQ - 2 Score 3 2 0 6 6  Altered sleeping 0 0  0 3  Tired, decreased energy 1 0  3 3  Change in appetite 0 0  3 3  Feeling bad or failure about yourself  0 0  0 0  Trouble concentrating 0 0  1 1  Moving slowly or fidgety/restless 0 0  0 0  Suicidal thoughts 0 0  0 0  PHQ-9 Score '4 2  13 16  '$ Difficult doing work/chores Not difficult at all   Not difficult at all     She currently lives with: husband Menopausal Symptoms: no  Depression Screen done today and results listed below:     11/04/2021   10:39 AM 03/31/2021    8:37 AM 02/03/2021   10:37 AM 11/27/2020    9:48 AM 10/23/2020    8:19 AM  Depression screen PHQ 2/9  Decreased Interest 3 1 0 3 3  Down, Depressed, Hopeless 0 1 0 3 3  PHQ - 2 Score 3 2 0 6 6  Altered sleeping 0 0  0 3  Tired, decreased energy 1 0  3 3  Change in appetite 0 0  3 3  Feeling bad or failure about yourself  0 0  0 0  Trouble concentrating 0 0  1 1  Moving slowly or fidgety/restless 0 0  0 0  Suicidal thoughts 0 0  0 0  PHQ-9 Score '4 2  13 16  '$ Difficult doing work/chores Not difficult at all   Not difficult at all  Past Medical History:  Past Medical History:  Diagnosis Date   Allergy    Arthritis    Bronchitis    Coronary artery disease    Depression    Diabetes mellitus without complication (HCC)    GERD (gastroesophageal reflux disease)    Headache    Hypertension    Myocardial infarction [facility]) June 2014, July 2014    Surgical History:  Past Surgical History:  Procedure Laterality Date   APPENDECTOMY     CATARACT EXTRACTION W/PHACO Right 04/22/2021   Procedure: CATARACT EXTRACTION PHACO AND INTRAOCULAR LENS PLACEMENT (IOC) RIGHT DIABETIC 23.38 02:10.8;  Surgeon: Birder Robson, MD;  Location: [place];  Service: Ophthalmology;  Laterality: Right;  Diabetic    CATARACT EXTRACTION W/PHACO Left 06/17/2021   Procedure: CATARACT EXTRACTION PHACO AND INTRAOCULAR LENS PLACEMENT (IOC) LEFT DIABETIC 6.39 00:38.2;  Surgeon: Birder Robson, MD;  Location: [place];  Service: Ophthalmology;  Laterality: Left;   CORONARY ANGIOPLASTY     with stents    DIGIT NAIL REMOVAL Bilateral 08/30/2015   Procedure: REMOVAL OF DIGIT NAILS/ BIL. 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   Procedure: EXCISION MORTON'S NEUROMA;  Surgeon: Albertine Patricia, DPM;  Location: [facility];  Service: Podiatry;  Laterality: Right;   JOINT REPLACEMENT Bilateral    Total Knee Replacement, Dr Burnis Kingfisher, [place] CATH AND CORONARY ANGIOGRAPHY N/A 02/06/2021   Procedure: LEFT HEART CATH AND CORONARY ANGIOGRAPHY;  Surgeon: Andrez Grime, MD;  Location: [place] [facility];  Service: Cardiovascular;  Laterality: N/A;   TONSILLECTOMY     TOTAL ABDOMINAL HYSTERECTOMY      Medications:  Current Outpatient Medications on File Prior to Visit  Medication Sig   Blood Glucose Calibration (OT ULTRA/FASTTK CNTRL SOLN) SOLN See admin instructions.   Blood Glucose Monitoring Suppl (FREESTYLE LITE) DEVI USE TO CHECK GLUCOSE ONCE DAILY   cetirizine (ZYRTEC) 10 MG tablet Take 10 mg by mouth daily.   diclofenac Sodium (VOLTAREN) 1 % GEL APPLY 4 GRAMS TOPICALLY 4 TIMES DAILY (Patient taking differently: Apply 4 g topically 4 (four) times daily as needed (pain).)   Easy Comfort Lancets MISC 3 (three) times daily. for testing   EPINEPHrine 0.3 mg/0.3 mL IJ SOAJ injection See admin instructions.   FREESTYLE LITE test strip SMARTSIG:Strip(s) Via Meter Daily   Lancet Devices (EASY MINI EJECT LANCING DEVICE) MISC See admin instructions.   Simethicone (GAS-X PO) Take 1 tablet by mouth daily as needed (flatulence).   No  current facility-administered medications on file prior to visit.    Allergies:  Allergies  Allergen Reactions   Alpha-Gal Hives    Red meat   Aspirin Hives and Other (See Comments)    "fainting"   Lisinopril Cough   Statins Other (See Comments)    Leg cramps    Social History:  Social History   Socioeconomic History   Marital status: Married    Spouse name: Not on file   Number of children: Not on file   Years of education: Not on file   Highest education level: Not on file  Occupational History   Occupation: retired  Tobacco Use   Smoking status: Former    Packs/day: 0.25    Types: Cigarettes    Quit date: 02/27/2004    Years since quitting: 17.6   Smokeless tobacco: Never  Vaping  Use   Vaping Use: Former  Substance and Sexual Activity   Alcohol use: No   Drug use: No   Sexual activity: Not Currently  Other Topics Concern   Not on file  Social History Narrative   Not on file   Social Determinants of Health   Financial Resource Strain: Low Risk  (02/03/2021)   Overall Financial Resource Strain (CARDIA)    Difficulty of Paying Living Expenses: Not hard at all  Food Insecurity: No Food Insecurity (02/03/2021)   Hunger Vital Sign    Worried About Running Out of Food in the Last Year: Never true    Ran Out of Food in the Last Year: Never true  Transportation Needs: No Transportation Needs (02/03/2021)   PRAPARE - Hydrologist (Medical): No    Lack of Transportation (Non-Medical): No  Physical Activity: Inactive (02/03/2021)   Exercise Vital Sign    Days of Exercise per Week: 0 days    Minutes of Exercise per Session: 0 min  Stress: No Stress Concern Present (02/03/2021)   [facility]    Feeling of Stress : Not at all  Social Connections: Not on file  Intimate Partner Violence: Not on file   Social History   Tobacco Use  Smoking Status Former   Packs/day: 0.25    Types: Cigarettes   Quit date: 02/27/2004   Years since quitting: 17.6  Smokeless Tobacco Never   Social History   Substance and Sexual Activity  Alcohol Use No    Family History:  Family History  Problem Relation Age of Onset   Colon cancer Mother 74   Cancer Mother        colon   Cancer Father        lung   Cancer Sister        ovarian   Breast cancer Neg Hx     Past medical history, surgical history, medications, allergies, family history and social history reviewed with patient today and changes made to appropriate areas of the chart.   Review of Systems  Constitutional: Negative.   HENT: Negative.    Eyes: Negative.   Respiratory: Negative.    Cardiovascular:  Positive for leg swelling. Negative for chest pain, palpitations, orthopnea, claudication and PND.  Gastrointestinal:  Positive for constipation. Negative for abdominal pain, blood in stool, diarrhea, heartburn, melena, nausea and vomiting.  Genitourinary: Negative.   Musculoskeletal: Negative.   Skin: Negative.   Neurological:  Positive for tingling. Negative for dizziness, tremors, sensory change, speech change, focal weakness, seizures, loss of consciousness, weakness and headaches.  Endo/Heme/Allergies:  Positive for polydipsia. Negative for environmental allergies. Bruises/bleeds easily.  Psychiatric/Behavioral: Negative.     All other ROS negative except what is listed above and in the HPI.      Objective:    BP 135/65   Pulse (!) 53   Temp 97.9 F (36.6 C)   Wt 174 lb 9.6 oz (79.2 kg)   SpO2 96%   BMI 30.93 kg/m   Wt Readings from Last 3 Encounters:  11/04/21 174 lb 9.6 oz (79.2 kg)  06/17/21 178 lb (80.7 kg)  04/22/21 172 lb (78 kg)    Physical Exam Vitals and nursing note reviewed.  Constitutional:      General: She is not in acute distress.    Appearance: Normal appearance. She is not ill-appearing, toxic-appearing or diaphoretic.  HENT:     Head: Normocephalic and atraumatic.  Right Ear: Tympanic membrane, ear canal and external ear normal. There is no impacted cerumen.     Left Ear: Tympanic membrane, ear canal and external ear normal. There is no impacted cerumen.     Nose: Nose normal. No congestion or rhinorrhea.     Mouth/Throat:     Mouth: Mucous membranes are moist.     Pharynx: Oropharynx is clear. No oropharyngeal exudate or posterior oropharyngeal erythema.  Eyes:     General: No scleral icterus.       Right eye: No discharge.        Left eye: No discharge.     Extraocular Movements: Extraocular movements intact.     Conjunctiva/sclera: Conjunctivae normal.     Pupils: Pupils are equal, round, and reactive to light.  Neck:     Vascular: No carotid bruit.  Cardiovascular:     Rate and Rhythm: Normal rate and regular rhythm.     Pulses: Normal pulses.     Heart sounds: No murmur heard.    No friction rub. No gallop.  Pulmonary:     Effort: Pulmonary effort is normal. No respiratory distress.     Breath sounds: Normal breath sounds. No stridor. No wheezing, rhonchi or rales.  Chest:     Chest wall: No tenderness.  Abdominal:     General: Abdomen is flat. Bowel sounds are normal. There is no distension.     Palpations: Abdomen is soft. There is no mass.     Tenderness: There is no abdominal tenderness. There is no right CVA tenderness, left CVA tenderness, guarding or rebound.     Hernia: No hernia is present.  Genitourinary:    Comments: Breast and pelvic exams deferred with shared decision making Musculoskeletal:        General: No swelling, tenderness, deformity or signs of injury.     Cervical back: Normal range of motion and neck supple. No rigidity. No muscular tenderness.     Right lower leg: No edema.     Left lower leg: No edema.  Lymphadenopathy:     Cervical: No cervical adenopathy.  Skin:    General: Skin is warm and dry.     Capillary Refill: Capillary refill takes less than 2 seconds.     Coloration: Skin is not jaundiced or  pale.     Findings: No bruising, erythema, lesion or rash.     Comments: Small well healing wound on the L arm  Neurological:     General: No focal deficit present.     Mental Status: She is alert and oriented to person, place, and time. Mental status is at baseline.     Cranial Nerves: No cranial nerve deficit.     Sensory: No sensory deficit.     Motor: No weakness.     Coordination: Coordination normal.     Gait: Gait normal.     Deep Tendon Reflexes: Reflexes normal.  Psychiatric:        Mood and Affect: Mood normal.        Behavior: Behavior normal.        Thought Content: Thought content normal.        Judgment: Judgment normal.     Results for orders placed or performed in visit on 11/04/21  Urinalysis, Routine w reflex microscopic  Result Value Ref Range   Specific Gravity, UA 1.020 1.005 - 1.030   pH, UA 7.0 5.0 - 7.5   Color, UA Yellow Yellow   Appearance Ur Clear  Clear   Leukocytes,UA Negative Negative   Protein,UA Negative Negative/Trace   Glucose, UA Negative Negative   Ketones, UA Negative Negative   RBC, UA Negative Negative   Bilirubin, UA Negative Negative   Urobilinogen, Ur 1.0 0.2 - 1.0 mg/dL   Nitrite, UA Negative Negative  Bayer DCA Hb A1c Waived  Result Value Ref Range   HB A1C (BAYER DCA - WAIVED) 6.1 (H) 4.8 - 5.6 %  Microalbumin, Urine Waived  Result Value Ref Range   Microalb, Ur Waived 10 0 - 19 mg/L   Creatinine, Urine Waived 100 10 - 300 mg/dL   Microalb/Creat Ratio <30 <30 mg/g      Assessment & Plan:   Problem List Items Addressed This Visit       Cardiovascular and Mediastinum   Essential hypertension    Under good control on current regimen. Continue current regimen. Continue to monitor. Call with any concerns. Refills given. Labs drawn today.       Relevant Medications   pravastatin (PRAVACHOL) 40 MG tablet   nitroGLYCERIN (NITROSTAT) 0.4 MG SL tablet   metoprolol succinate (TOPROL XL) 25 MG 24 hr tablet   losartan (COZAAR)  50 MG tablet   isosorbide mononitrate (IMDUR) 60 MG 24 hr tablet   hydrochlorothiazide (HYDRODIURIL) 25 MG tablet   fenofibrate (TRICOR) 145 MG tablet   Other Relevant Orders   CBC with Differential/Platelet   Comprehensive metabolic panel   TSH   Microalbumin, Urine Waived (Completed)   Coronary artery disease of native artery of native heart with stable angina pectoris ([place])    Continue to follow with cardiology. Call with any concerns. Will keep BP and cholesterol under good control. Continue to monitor.       Relevant Medications   pravastatin (PRAVACHOL) 40 MG tablet   PARoxetine (PAXIL) 30 MG tablet   traZODone (DESYREL) 50 MG tablet   nitroGLYCERIN (NITROSTAT) 0.4 MG SL tablet   metoprolol succinate (TOPROL XL) 25 MG 24 hr tablet   losartan (COZAAR) 50 MG tablet   isosorbide mononitrate (IMDUR) 60 MG 24 hr tablet   hydrochlorothiazide (HYDRODIURIL) 25 MG tablet   fenofibrate (TRICOR) 145 MG tablet   Senile purpura (HCC)    Reassured patient. Continue to monitor. Call with any concerns.       Relevant Medications   pravastatin (PRAVACHOL) 40 MG tablet   nitroGLYCERIN (NITROSTAT) 0.4 MG SL tablet   metoprolol succinate (TOPROL XL) 25 MG 24 hr tablet   losartan (COZAAR) 50 MG tablet   isosorbide mononitrate (IMDUR) 60 MG 24 hr tablet   hydrochlorothiazide (HYDRODIURIL) 25 MG tablet   fenofibrate (TRICOR) 145 MG tablet     Endocrine   Type 2 diabetes mellitus with complication, without long-term current use of insulin (HCC)    Doing well with A1c of 6.1. Continue current regimen. Continue to monitor. Call with any concerns. Refills given.       Relevant Medications   pravastatin (PRAVACHOL) 40 MG tablet   metFORMIN (GLUCOPHAGE) 500 MG tablet   losartan (COZAAR) 50 MG tablet   Other Relevant Orders   CBC with Differential/Platelet   Comprehensive metabolic panel   Urinalysis, Routine w reflex microscopic (Completed)   Bayer DCA Hb A1c Waived (Completed)    Microalbumin, Urine Waived (Completed)     Other   Hyperlipidemia    Under good control on current regimen. Continue current regimen. Continue to monitor. Call with any concerns. Refills given. Labs drawn today.  Relevant Medications   pravastatin (PRAVACHOL) 40 MG tablet   nitroGLYCERIN (NITROSTAT) 0.4 MG SL tablet   metoprolol succinate (TOPROL XL) 25 MG 24 hr tablet   losartan (COZAAR) 50 MG tablet   isosorbide mononitrate (IMDUR) 60 MG 24 hr tablet   hydrochlorothiazide (HYDRODIURIL) 25 MG tablet   fenofibrate (TRICOR) 145 MG tablet   Other Relevant Orders   CBC with Differential/Platelet   Comprehensive metabolic panel   Lipid Panel w/o Chol/HDL Ratio   Moderate episode of recurrent major depressive disorder (HCC)    Under good control on current regimen. Continue current regimen. Continue to monitor. Call with any concerns. Refills given. Labs drawn today.       Relevant Medications   PARoxetine (PAXIL) 30 MG tablet   traZODone (DESYREL) 50 MG tablet   Other Relevant Orders   CBC with Differential/Platelet   Comprehensive metabolic panel   Vitamin D deficiency    Rechecking labs today. Await results. Treat as needed.       Relevant Orders   CBC with Differential/Platelet   Comprehensive metabolic panel   VITAMIN D 25 Hydroxy (Vit-D Deficiency, Fractures)   Other Visit Diagnoses     Routine general medical examination at a health care facility    -  Primary   Vaccines up to date. Screening labs checked today. DEXA ordered. Continue to monitor. Call with any concerns.    Open wound of left forearm, initial encounter       Healing well. Due for Td. Given today.   Relevant Orders   Td : Tetanus/diphtheria >7yo Preservative  free (Completed)   Screening for osteoporosis       DEXA ordered today.   Relevant Orders   DG Bone Density        Follow up plan: Return in about 6 months (around 05/06/2022).   LABORATORY TESTING:  - Pap smear: not  applicable  IMMUNIZATIONS:   - Tdap: Tetanus vaccination status reviewed: Td vaccination indicated and given today. - Influenza: Up to date - Pneumovax: Up to date - Prevnar: Up to date - COVID: Up to date - HPV: Not applicable - Shingrix vaccine: Not applicable  SCREENING: -Mammogram: Not applicable  - Colonoscopy: Not applicable  - Bone Density: Ordered today    PATIENT COUNSELING:   Advised to take 1 mg of folate supplement per day if capable of pregnancy.   Sexuality: Discussed sexually transmitted diseases, partner selection, use of condoms, avoidance of unintended pregnancy  and contraceptive alternatives.   Advised to avoid cigarette smoking.  I discussed with the patient that most people either abstain from alcohol or drink within safe limits (<=14/week and <=4 drinks/occasion for males, <=7/weeks and <= 3 drinks/occasion for females) and that the risk for alcohol disorders and other health effects rises proportionally with the number of drinks per week and how often a drinker exceeds daily limits.  Discussed cessation/primary prevention of drug use and availability of treatment for abuse.   Diet: Encouraged to adjust caloric intake to maintain  or achieve ideal body weight, to reduce intake of dietary saturated fat and total fat, to limit sodium intake by avoiding high sodium foods and not adding table salt, and to maintain adequate dietary potassium and calcium preferably from fresh fruits, vegetables, and low-fat dairy products.    stressed the importance of regular exercise  Injury prevention: Discussed safety belts, safety helmets, smoke detector, smoking near bedding or upholstery.   Dental health: Discussed importance of regular tooth brushing, flossing, and  dental visits.    NEXT PREVENTATIVE PHYSICAL DUE IN 1 YEAR. Return in about 6 months (around 05/06/2022).

## 2021-11-04 NOTE — Assessment & Plan Note (Signed)
Rechecking labs today. Await results. Treat as needed.  °

## 2021-11-04 NOTE — Assessment & Plan Note (Signed)
Continue to follow with cardiology. Call with any concerns. Will keep BP and cholesterol under good control. Continue to monitor.

## 2021-11-05 LAB — COMPREHENSIVE METABOLIC PANEL
ALT: 7 IU/L (ref 0–32)
AST: 17 IU/L (ref 0–40)
Albumin/Globulin Ratio: 2 (ref 1.2–2.2)
Albumin: 4 g/dL (ref 3.7–4.7)
Alkaline Phosphatase: 37 IU/L — ABNORMAL LOW (ref 44–121)
BUN/Creatinine Ratio: 14 (ref 12–28)
BUN: 12 mg/dL (ref 8–27)
Bilirubin Total: 0.4 mg/dL (ref 0.0–1.2)
CO2: 26 mmol/L (ref 20–29)
Calcium: 9.2 mg/dL (ref 8.7–10.3)
Chloride: 89 mmol/L — ABNORMAL LOW (ref 96–106)
Creatinine, Ser: 0.83 mg/dL (ref 0.57–1.00)
Globulin, Total: 2 g/dL (ref 1.5–4.5)
Glucose: 108 mg/dL — ABNORMAL HIGH (ref 70–99)
Potassium: 4.4 mmol/L (ref 3.5–5.2)
Sodium: 129 mmol/L — ABNORMAL LOW (ref 134–144)
Total Protein: 6 g/dL (ref 6.0–8.5)
eGFR: 71 mL/min/{1.73_m2} (ref >59)

## 2021-11-05 LAB — CBC WITH DIFFERENTIAL/PLATELET
Basophils Absolute: 0 10*3/uL (ref 0.0–0.2)
Basos: 1 %
EOS (ABSOLUTE): 0.2 10*3/uL (ref 0.0–0.4)
Eos: 4 %
Hematocrit: 35.5 % (ref 34.0–46.6)
Hemoglobin: 11.6 g/dL (ref 11.1–15.9)
Immature Grans (Abs): 0 10*3/uL (ref 0.0–0.1)
Immature Granulocytes: 1 %
Lymphocytes Absolute: 1.4 10*3/uL (ref 0.7–3.1)
Lymphs: 32 %
MCH: 27.8 pg (ref 26.6–33.0)
MCHC: 32.7 g/dL (ref 31.5–35.7)
MCV: 85 fL (ref 79–97)
Monocytes Absolute: 0.5 10*3/uL (ref 0.1–0.9)
Monocytes: 12 %
Neutrophils Absolute: 2.2 10*3/uL (ref 1.4–7.0)
Neutrophils: 50 %
RBC: 4.18 x10E6/uL (ref 3.77–5.28)
RDW: 12.8 % (ref 11.7–15.4)
WBC: 4.3 10*3/uL (ref 3.4–10.8)

## 2021-11-05 LAB — LIPID PANEL W/O CHOL/HDL RATIO
Cholesterol, Total: 191 mg/dL (ref 100–199)
HDL: 59 mg/dL (ref >39)
LDL Chol Calc (NIH): 116 mg/dL — ABNORMAL HIGH (ref 0–99)
Triglycerides: 88 mg/dL (ref 0–149)
VLDL Cholesterol Cal: 16 mg/dL (ref 5–40)

## 2021-11-05 LAB — TSH: TSH: 2.88 u[IU]/mL (ref 0.450–4.500)

## 2021-11-05 LAB — VITAMIN D 25 HYDROXY (VIT D DEFICIENCY, FRACTURES): Vit D, 25-Hydroxy: 33.9 ng/mL (ref 30.0–100.0)

## 2021-12-19 DIAGNOSIS — Z961 Presence of intraocular lens: Secondary | ICD-10-CM | POA: Diagnosis not present

## 2021-12-19 LAB — HM DIABETES EYE EXAM

## 2021-12-31 ENCOUNTER — Other Ambulatory Visit: Payer: Medicare HMO

## 2022-01-25 IMAGING — MG MM DIGITAL DIAGNOSTIC UNILAT*L* W/ TOMO W/ CAD
4 series · 4 of 12 positions shown · non-contrast
Comparison: Previous exams.

CLINICAL DATA: Screening recall for asymmetry seen in the left
breast on the MLO view only.

EXAM:
DIGITAL DIAGNOSTIC UNILATERAL LEFT MAMMOGRAM WITH TOMO AND CAD

[L MLO synth-2D]
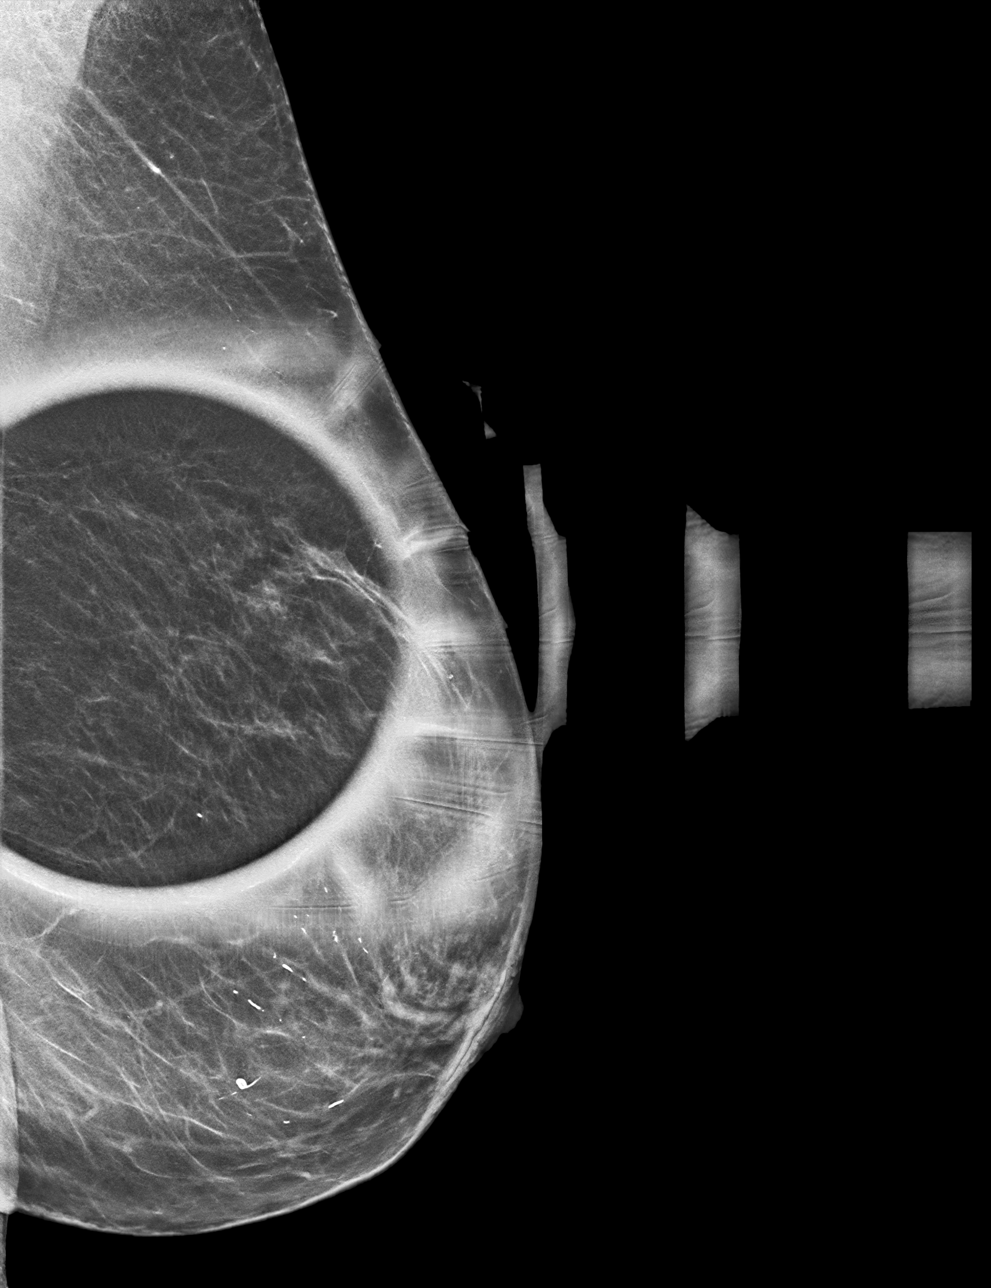

[L ML synth-2D]
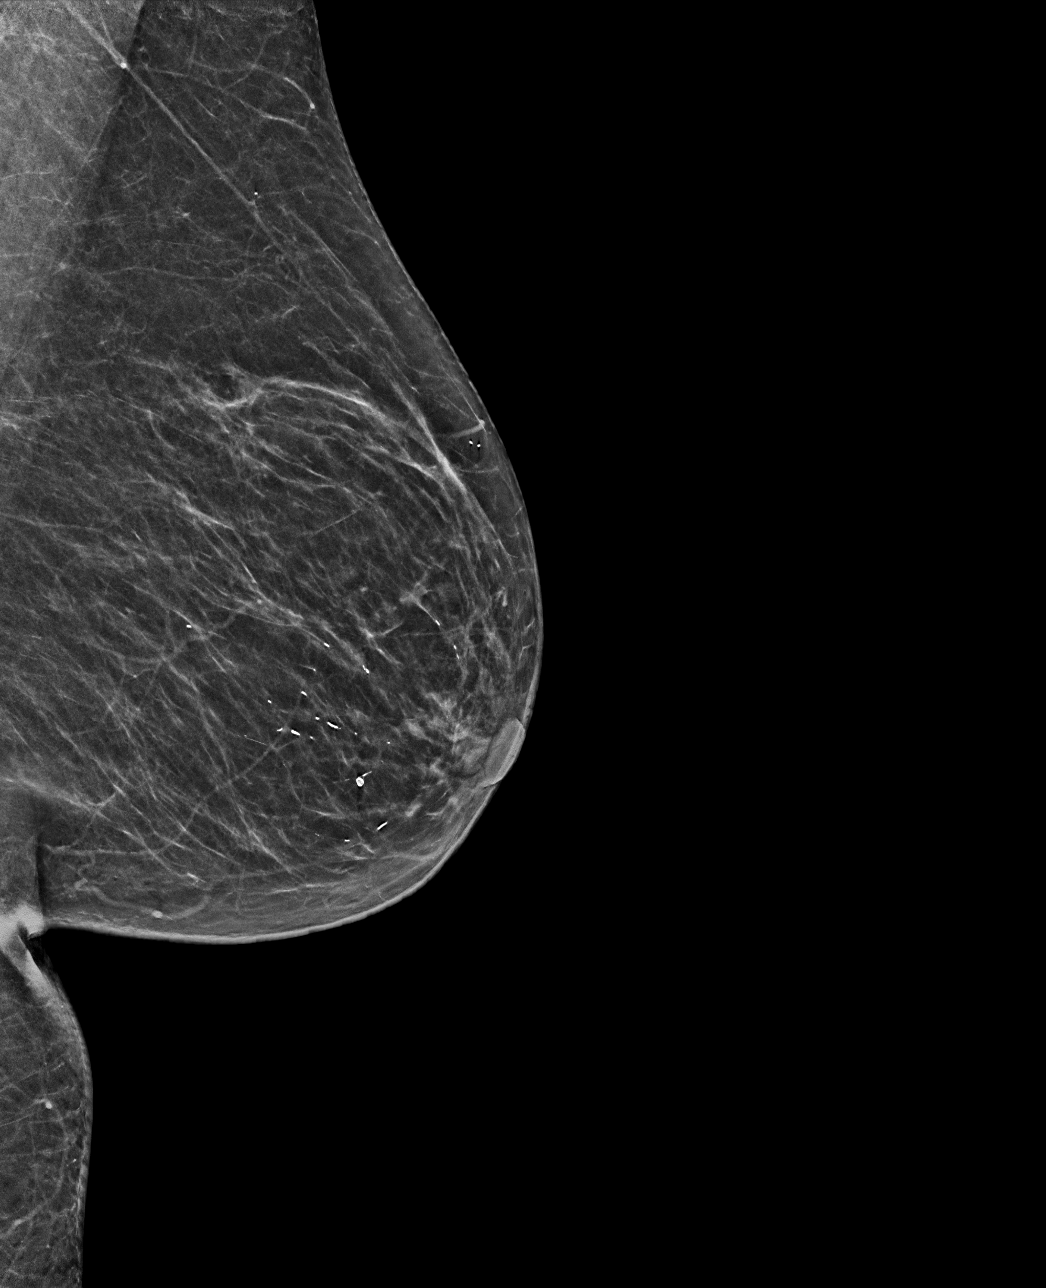

[L ML tomo · tomo slice 28/55.0]
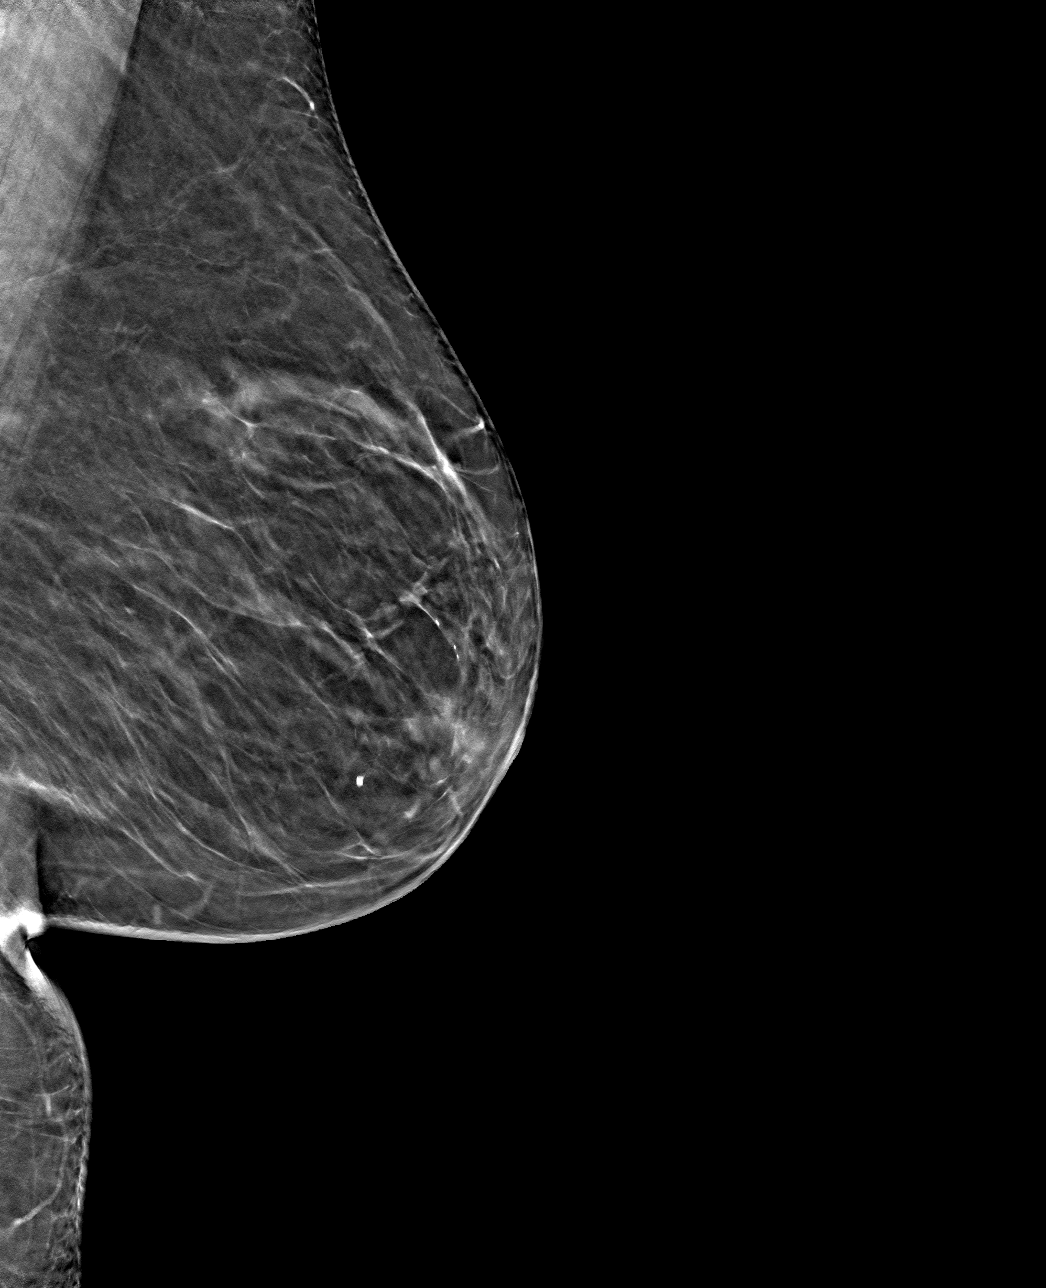

[L MLO tomo · tomo slice 25/49.0]
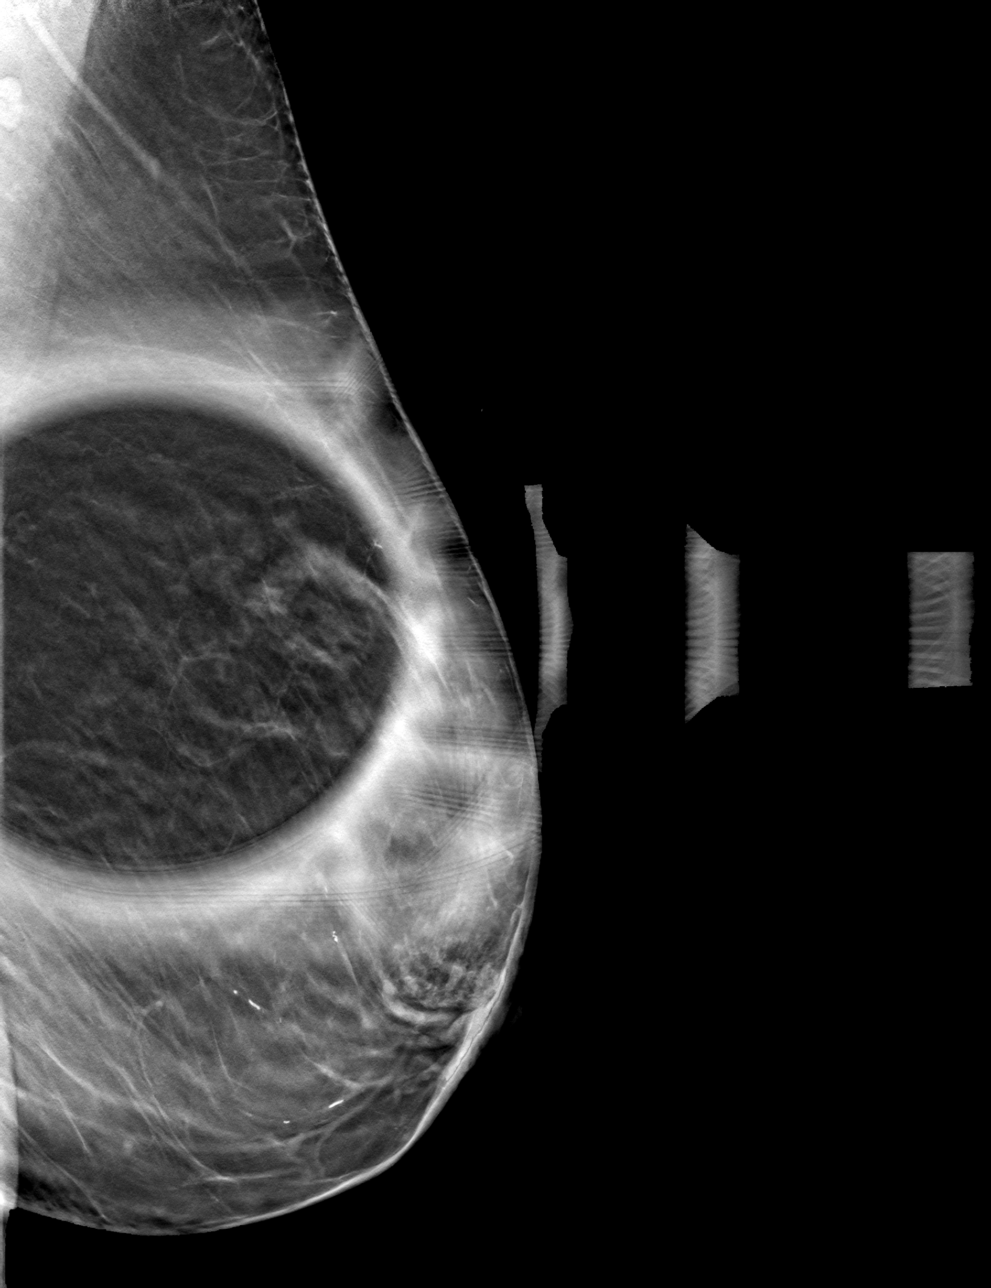

[4 of 12 positions shown; findings below may reference images not displayed]

ACR Breast Density Category b: There are scattered areas of
fibroglandular density.
FINDINGS: Additional tomograms were performed of the left breast. The
initially questioned possible left breast asymmetry resolves on the
additional imaging with findings compatible with an area of
overlapping fibroglandular tissue. There is no mammographic evidence
of malignancy in the left breast.

Mammographic images were processed with CAD.
IMPRESSION: No mammographic evidence of malignancy in the left breast.

RECOMMENDATION:
Screening mammogram in one year.(Code:R2-R-NLF)

I have discussed the findings and recommendations with the patient.
If applicable, a reminder letter will be sent to the patient
regarding the next appointment.

BI-RADS CATEGORY  1: Negative.

## 2022-02-06 ENCOUNTER — Ambulatory Visit: Payer: Medicare HMO

## 2022-02-10 ENCOUNTER — Ambulatory Visit (INDEPENDENT_AMBULATORY_CARE_PROVIDER_SITE_OTHER): Payer: Medicare HMO | Admitting: *Deleted

## 2022-02-10 DIAGNOSIS — Z0000 Encounter for general adult medical examination without abnormal findings: Secondary | ICD-10-CM

## 2022-02-10 NOTE — Patient Instructions (Signed)
Susan Allen , Thank you for taking time to come for your Medicare Wellness Visit. I appreciate your ongoing commitment to your health goals. Please review the following plan we discussed and let me know if I can assist you in the future.   Screening recommendations/referrals: Colonoscopy: no longer required Mammogram: no longer required Bone Density: Education provided Recommended yearly ophthalmology/optometry visit for glaucoma screening and checkup Recommended yearly dental visit for hygiene and checkup  Vaccinations: Influenza vaccine: up to date Pneumococcal vaccine: up to date Tdap vaccine: Education provided Shingles vaccine: Education provided    Advanced directives: Education provided  Conditions/risks identified:      Preventive Care 73 Years and Older, Female Preventive care refers to lifestyle choices and visits with your health care provider that can promote health and wellness. What does preventive care include? A yearly physical exam. This is also called an annual well check. Dental exams once or twice a year. Routine eye exams. Ask your health care provider how often you should have your eyes checked. Personal lifestyle choices, including: Daily care of your teeth and gums. Regular physical activity. Eating a healthy diet. Avoiding tobacco and drug use. Limiting alcohol use. Practicing safe sex. Taking low-dose aspirin every day. Taking vitamin and mineral supplements as recommended by your health care provider. What happens during an annual well check? The services and screenings done by your health care provider during your annual well check will depend on your age, overall health, lifestyle risk factors, and family history of disease. Counseling  Your health care provider may ask you questions about your: Alcohol use. Tobacco use. Drug use. Emotional well-being. Home and relationship well-being. Sexual activity. Eating habits. History of falls. Memory  and ability to understand (cognition). Work and work Statistician. Reproductive health. Screening  You may have the following tests or measurements: Height, weight, and BMI. Blood pressure. Lipid and cholesterol levels. These may be checked every 5 years, or more frequently if you are over 2 years old. Skin check. Lung cancer screening. You may have this screening every year starting at age 22 if you have a 30-pack-year history of smoking and currently smoke or have quit within the past 15 years. Fecal occult blood test (FOBT) of the stool. You may have this test every year starting at age 60. Flexible sigmoidoscopy or colonoscopy. You may have a sigmoidoscopy every 5 years or a colonoscopy every 10 years starting at age 94. Hepatitis C blood test. Hepatitis B blood test. Sexually transmitted disease (STD) testing. Diabetes screening. This is done by checking your blood sugar (glucose) after you have not eaten for a while (fasting). You may have this done every 1-3 years. Bone density scan. This is done to screen for osteoporosis. You may have this done starting at age 65. Mammogram. This may be done every 1-2 years. Talk to your health care provider about how often you should have regular mammograms. Talk with your health care provider about your test results, treatment options, and if necessary, the need for more tests. Vaccines  Your health care provider may recommend certain vaccines, such as: Influenza vaccine. This is recommended every year. Tetanus, diphtheria, and acellular pertussis (Tdap, Td) vaccine. You may need a Td booster every 10 years. Zoster vaccine. You may need this after age 38. Pneumococcal 13-valent conjugate (PCV13) vaccine. One dose is recommended after age 63. Pneumococcal polysaccharide (PPSV23) vaccine. One dose is recommended after age 3. Talk to your health care provider about which screenings and vaccines you need  and how often you need them. This  information is not intended to replace advice given to you by your health care provider. Make sure you discuss any questions you have with your health care provider. Document Released: 06/07/2015 Document Revised: 01/29/2016 Document Reviewed: 03/12/2015 Elsevier Interactive Patient Education  2017 [employer] Prevention in the Home Falls can cause injuries. They can happen to people of all ages. There are many things you can do to make your home safe and to help prevent falls. What can I do on the outside of my home? Regularly fix the edges of walkways and driveways and fix any cracks. Remove anything that might make you trip as you walk through a door, such as a raised step or threshold. Trim any bushes or trees on the path to your home. Use bright outdoor lighting. Clear any walking paths of anything that might make someone trip, such as rocks or tools. Regularly check to see if handrails are loose or broken. Make sure that both sides of any steps have handrails. Any raised decks and porches should have guardrails on the edges. Have any leaves, snow, or ice cleared regularly. Use sand or salt on walking paths during winter. Clean up any spills in your garage right away. This includes oil or grease spills. What can I do in the bathroom? Use night lights. Install grab bars by the toilet and in the tub and shower. Do not use towel bars as grab bars. Use non-skid mats or decals in the tub or shower. If you need to sit down in the shower, use a plastic, non-slip stool. Keep the floor dry. Clean up any water that spills on the floor as soon as it happens. Remove soap buildup in the tub or shower regularly. Attach bath mats securely with double-sided non-slip rug tape. Do not have throw rugs and other things on the floor that can make you trip. What can I do in the bedroom? Use night lights. Make sure that you have a light by your bed that is easy to reach. Do not use any sheets or  blankets that are too big for your bed. They should not hang down onto the floor. Have a firm chair that has side arms. You can use this for support while you get dressed. Do not have throw rugs and other things on the floor that can make you trip. What can I do in the kitchen? Clean up any spills right away. Avoid walking on wet floors. Keep items that you use a lot in easy-to-reach places. If you need to reach something above you, use a strong step stool that has a grab bar. Keep electrical cords out of the way. Do not use floor polish or wax that makes floors slippery. If you must use wax, use non-skid floor wax. Do not have throw rugs and other things on the floor that can make you trip. What can I do with my stairs? Do not leave any items on the stairs. Make sure that there are handrails on both sides of the stairs and use them. Fix handrails that are broken or loose. Make sure that handrails are as long as the stairways. Check any carpeting to make sure that it is firmly attached to the stairs. Fix any carpet that is loose or worn. Avoid having throw rugs at the top or bottom of the stairs. If you do have throw rugs, attach them to the floor with carpet tape. Make sure that you  have a light switch at the top of the stairs and the bottom of the stairs. If you do not have them, ask someone to add them for you. What else can I do to help prevent falls? Wear shoes that: Do not have high heels. Have rubber bottoms. Are comfortable and fit you well. Are closed at the toe. Do not wear sandals. If you use a stepladder: Make sure that it is fully opened. Do not climb a closed stepladder. Make sure that both sides of the stepladder are locked into place. Ask someone to hold it for you, if possible. Clearly mark and make sure that you can see: Any grab bars or handrails. First and last steps. Where the edge of each step is. Use tools that help you move around (mobility aids) if they are  needed. These include: Canes. Walkers. Scooters. Crutches. Turn on the lights when you go into a dark area. Replace any light bulbs as soon as they burn out. Set up your furniture so you have a clear path. Avoid moving your furniture around. If any of your floors are uneven, fix them. If there are any pets around you, be aware of where they are. Review your medicines with your doctor. Some medicines can make you feel dizzy. This can increase your chance of falling. Ask your doctor what other things that you can do to help prevent falls. This information is not intended to replace advice given to you by your health care provider. Make sure you discuss any questions you have with your health care provider. Document Released: 03/07/2009 Document Revised: 10/17/2015 Document Reviewed: 06/15/2014 Elsevier Interactive Patient Education  2017 [employer].

## 2022-02-10 NOTE — Progress Notes (Signed)
Subjective:   Susan Allen is a 81 y.o. female who presents for Medicare Annual (Subsequent) preventive examination.  I connected with  Wilhelmina Mcardle on 02/10/22 by a telephone enabled telemedicine application and verified that I am speaking with the correct person using two identifiers.   I discussed the limitations of evaluation and management by telemedicine. The patient expressed understanding and agreed to proceed.  Patient location: home  Provider location: Tele-health-home     Review of Systems     Cardiac Risk Factors include: advanced age (>78mn, >>54women);diabetes mellitus;hypertension;family history of premature cardiovascular disease     Objective:    Today's Vitals   There is no height or weight on file to calculate BMI.     02/10/2022   10:36 AM 06/17/2021    6:53 AM 04/22/2021    6:35 AM 02/06/2021    7:06 AM 02/03/2021   10:36 AM 09/15/2020    1:50 AM 02/02/2020   10:35 AM  Advanced Directives  Does Patient Have a Medical Advance Directive? Yes Yes Yes No No No No  Type of [place]     Does patient want to make changes to medical advance directive?  No - Patient declined No - Patient declined      Copy of HWheatonin Chart? No - copy requested No - copy requested No - copy requested      Would patient like information on creating a medical advance directive?    No - Patient declined       Current Medications (verified) Outpatient Encounter Medications as of 02/10/2022  Medication Sig   Blood Glucose Calibration (OT ULTRA/FASTTK CNTRL SOLN) SOLN See admin instructions.   Blood Glucose Monitoring Suppl (FREESTYLE LITE) DEVI USE TO CHECK GLUCOSE ONCE DAILY   cetirizine (ZYRTEC) 10 MG tablet Take 10 mg by mouth daily.   clopidogrel (PLAVIX) 75 MG tablet Take 1 tablet (75 mg total) by mouth daily.   diclofenac Sodium (VOLTAREN) 1 % GEL  APPLY 4 GRAMS TOPICALLY 4 TIMES DAILY (Patient taking differently: Apply 4 g topically 4 (four) times daily as needed (pain).)   Easy Comfort Lancets MISC 3 (three) times daily. for testing   EPINEPHrine 0.3 mg/0.3 mL IJ SOAJ injection See admin instructions.   fenofibrate (TRICOR) 145 MG tablet Take 1 tablet (145 mg total) by mouth at bedtime.   FREESTYLE LITE test strip SMARTSIG:Strip(s) Via Meter Daily   hydrochlorothiazide (HYDRODIURIL) 25 MG tablet Take 1 tablet (25 mg total) by mouth daily.   isosorbide mononitrate (IMDUR) 60 MG 24 hr tablet Take 1 tablet (60 mg total) by mouth daily.   Lancet Devices (EASY MINI EJECT LANCING DEVICE) MISC See admin instructions.   losartan (COZAAR) 50 MG tablet Take 1 tablet (50 mg total) by mouth daily.   metFORMIN (GLUCOPHAGE) 500 MG tablet TAKE 2 TABLETS BY MOUTH TWICE DAILY WITH A MEAL   metoprolol succinate (TOPROL XL) 25 MG 24 hr tablet Take 1 tablet (25 mg total) by mouth daily.   nitroGLYCERIN (NITROSTAT) 0.4 MG SL tablet Place 1 tablet (0.4 mg total) under the tongue every 5 (five) minutes as needed for chest pain.   omeprazole (PRILOSEC) 40 MG capsule Take 1 capsule (40 mg total) by mouth daily.   PARoxetine (PAXIL) 30 MG tablet Take 2 tablets (60 mg total) by mouth at bedtime.   Simethicone (GAS-X PO) Take 1 tablet  by mouth daily as needed (flatulence).   traZODone (DESYREL) 50 MG tablet Take 1 tablet (50 mg total) by mouth at bedtime.   triamcinolone cream (KENALOG) 0.1 % Apply 1 application  topically daily as needed (irritation).   pravastatin (PRAVACHOL) 40 MG tablet Take 1 tablet (40 mg total) by mouth every other day. (Patient not taking: Reported on 02/10/2022)   No facility-administered encounter medications on file as of 02/10/2022.    Allergies (verified) Alpha-gal, Aspirin, Lisinopril, and Statins   History: Past Medical History:  Diagnosis Date   Allergy    Arthritis    Bronchitis    Coronary artery disease    Depression     Diabetes mellitus without complication ([place])    GERD (gastroesophageal reflux disease)    Headache    Hypertension    Myocardial infarction [facility]) June 2014, July 2014   Past Surgical History:  Procedure Laterality Date   APPENDECTOMY     CATARACT EXTRACTION W/PHACO Right 04/22/2021   Procedure: CATARACT EXTRACTION PHACO AND INTRAOCULAR LENS PLACEMENT (IOC) RIGHT DIABETIC 23.38 02:10.8;  Surgeon: Birder Robson, MD;  Location: [place];  Service: Ophthalmology;  Laterality: Right;  Diabetic   CATARACT EXTRACTION W/PHACO Left 06/17/2021   Procedure: CATARACT EXTRACTION PHACO AND INTRAOCULAR LENS PLACEMENT (IOC) LEFT DIABETIC 6.39 00:38.2;  Surgeon: Birder Robson, MD;  Location: [place];  Service: Ophthalmology;  Laterality: Left;   CORONARY ANGIOPLASTY     with stents    DIGIT NAIL REMOVAL Bilateral 08/30/2015   Procedure: REMOVAL OF DIGIT NAILS/ BIL. 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   Procedure: EXCISION MORTON'S NEUROMA;  Surgeon: Albertine Patricia, DPM;  Location: [facility];  Service: Podiatry;  Laterality: Right;   JOINT REPLACEMENT Bilateral    Total Knee Replacement, Dr Burnis Kingfisher, [place] CATH AND CORONARY ANGIOGRAPHY N/A 02/06/2021   Procedure: LEFT HEART CATH AND CORONARY ANGIOGRAPHY;  Surgeon: Andrez Grime, MD;  Location: [place] CV LAB;  Service: Cardiovascular;  Laterality: N/A;   TONSILLECTOMY     TOTAL ABDOMINAL HYSTERECTOMY     Family History  Problem Relation Age of Onset   Colon cancer Mother 1   Cancer Mother        colon   Cancer Father        lung   Cancer Sister        ovarian   Breast cancer Neg Hx    Social History   Socioeconomic History   Marital status: Married    Spouse name: Not on file   Number of children: Not on file   Years of  education: Not on file   Highest education level: Not on file  Occupational History   Occupation: retired  Tobacco Use   Smoking status: Former    Packs/day: 0.25    Types: Cigarettes    Quit date: 02/27/2004    Years since quitting: 17.9   Smokeless tobacco: Never  Vaping Use   Vaping Use: Former  Substance and Sexual Activity   Alcohol use: No   Drug use: No   Sexual activity: Not Currently  Other Topics Concern   Not on file  Social History Narrative   Not on file   Social Determinants of Health   Financial Resource Strain: Low Risk  (02/10/2022)   Overall Financial Resource Strain ([place])  Difficulty of Paying Living Expenses: Not hard at all  Food Insecurity: No Food Insecurity (02/10/2022)   Hunger Vital Sign    Worried About Running Out of Food in the Last Year: Never true    Ran Out of Food in the Last Year: Never true  Transportation Needs: No Transportation Needs (02/10/2022)   PRAPARE - Hydrologist (Medical): No    Lack of Transportation (Non-Medical): No  Physical Activity: Insufficiently Active (02/10/2022)   Exercise Vital Sign    Days of Exercise per Week: 3 days    Minutes of Exercise per Session: 40 min  Stress: No Stress Concern Present (02/10/2022)   [place]    Feeling of Stress : Not at all  Social Connections: [place] (02/10/2022)   Social Connection and Isolation Panel [NHANES]    Frequency of Communication with Friends and Family: More than three times a week    Frequency of Social Gatherings with Friends and Family: Three times a week    Attends Religious Services: More than 4 times per year    Active Member of Clubs or Organizations: Yes    Attends Music therapist: More than 4 times per year    Marital Status: Married    Tobacco Counseling Counseling given: Not Answered   Clinical Intake:  Pre-visit preparation  completed: Yes  Pain : No/denies pain     Diabetes: Yes CBG done?: No Did pt. bring in CBG monitor from home?: No  How often do you need to have someone help you when you read instructions, pamphlets, or other written materials from your doctor or pharmacy?: 1 - Never  Diabetic?  Yes  Nutrition Risk Assessment:  Has the patient had any N/V/D within the last 2 months?  No  Does the patient have any non-healing wounds?  No  Has the patient had any unintentional weight loss or weight gain?  No   Diabetes:  Is the patient diabetic?  Yes  If diabetic, was a CBG obtained today?  No  Did the patient bring in their glucometer from home?  No  How often do you monitor your CBG's?   1 x DAY .   Financial Strains and Diabetes Management:  Are you having any financial strains with the device, your supplies or your medication? No .  Does the patient want to be seen by Chronic Care Management for management of their diabetes?  No  Would the patient like to be referred to a Nutritionist or for Diabetic Management?  No   Diabetic Exams:  Diabetic Eye Exam:  Pt has been advised about the importance in completing this exam.   Diabetic Foot Exam: Pt has been advised about the importance in completing this exam.  Interpreter Needed?: No  Information entered by :: Leroy Kennedy LPN   Activities of Daily Living    02/10/2022   11:00 AM 06/17/2021    6:49 AM  In your present state of health, do you have any difficulty performing the following activities:  Hearing? 0 0  Vision? 0 0  Difficulty concentrating or making decisions? 0 0  Walking or climbing stairs? 0 0  Dressing or bathing? 0 0  Doing errands, shopping? 0   Preparing Food and eating ? N   Using the Toilet? N   In the past six months, have you accidently leaked urine? Y   Do you have problems with loss of bowel  control? N   Managing your Medications? N   Managing your Finances? N   Housekeeping or managing your  Housekeeping? N     Patient Care Team: Valerie Roys, DO as PCP - General (Family Medicine)  Indicate any recent Medical Services you may have received from other than [facility] providers in the past year (date may be approximate).     Assessment:   This is a routine wellness examination for Layana.  Hearing/Vision screen Hearing Screening - Comments:: No trouble hearing Vision Screening - Comments:: [facility] Up to date Cataract surgery 2023  Dietary issues and exercise activities discussed: Current Exercise Habits: Structured exercise class, Type of exercise: strength training/weights (pool [facility] the [employer]), Time (Minutes): 30, Frequency (Times/Week): 2, Weekly Exercise (Minutes/Week): 60, Intensity: Mild   Goals Addressed             This Visit's Progress    Patient Stated   Not on track    02/02/2020, wants to weigh 140 pounds     Patient Stated   On track    02/03/2021, no goals     Patient Stated       Increase physical activity        Depression Screen    02/10/2022   10:48 AM 11/04/2021   10:39 AM 03/31/2021    8:37 AM 02/03/2021   10:37 AM 11/27/2020    9:48 AM 10/23/2020    8:19 AM 02/02/2020   10:36 AM  PHQ 2/9 Scores  PHQ - 2 Score '3 3 2 '$ 0 6 6 0  PHQ- 9 Score '5 4 2  13 16 '$ 0    Fall Risk    02/10/2022   10:37 AM 11/04/2021   10:39 AM 02/03/2021   10:37 AM 10/23/2020    8:20 AM 02/02/2020   10:36 AM  Fall Risk   Falls in the past year? 1 0 0 0 0  Number falls in past yr: 0 0  0   Injury with Fall? 1 0  0   Risk for fall due to :  No Fall Risks Medication side effect No Fall Risks Medication side effect  Follow up Falls evaluation completed;Education provided;Falls prevention discussed Falls evaluation completed Falls evaluation completed;Education provided;Falls prevention discussed Falls evaluation completed Falls evaluation completed;Education provided;Falls prevention discussed    FALL RISK PREVENTION PERTAINING TO THE HOME:  Any stairs in or around  the home? Yes  If so, are there any without handrails? No  Home free of loose throw rugs in walkways, pet beds, electrical cords, etc? Yes  Adequate lighting in your home to reduce risk of falls? Yes   ASSISTIVE DEVICES UTILIZED TO PREVENT FALLS:  Life alert? No  Use of a cane, walker or w/c? No  Grab bars in the bathroom? Yes  Shower chair or bench in shower? No  Elevated toilet seat or a handicapped toilet? Yes   TIMED UP AND GO:  Was the test performed? No .    Cognitive Function:        02/10/2022   10:39 AM 02/03/2021   10:39 AM 02/02/2020   10:38 AM 01/20/2019    8:52 AM  6CIT Screen  What Year?  0 points 0 points 0 points  What month?  0 points 0 points 0 points  What time? 0 points 0 points 0 points 0 points  Count back from 20 0 points 0 points 0 points 0 points  Months in reverse 0 points 0 points 0 points  0 points  Repeat phrase 0 points 2 points 0 points 2 points  Total Score  2 points 0 points 2 points    Immunizations Immunization History  Administered Date(s) Administered   Fluad Quad(high Dose 65+) 04/26/2019, 04/23/2020, 03/31/2021   Influenza, High Dose Seasonal PF 03/23/2017   Influenza-Unspecified 03/26/2015, 03/31/2016, 02/23/2018   PFIZER(Purple Top)SARS-COV-2 Vaccination 07/31/2019, 08/30/2019, 10/24/2020   Pneumococcal Conjugate-13 03/18/2015   Pneumococcal Polysaccharide-23 03/31/2016, 10/24/2020   Td 11/04/2021   Tdap 12/25/2011    TDAP status: Up to date  Flu Vaccine status: Up to date  Pneumococcal vaccine status: Up to date  Covid-19 vaccine status: Information provided on how to obtain vaccines.   Qualifies for Shingles Vaccine? Yes   Zostavax completed No   Shingrix Completed?: No.    Education has been provided regarding the importance of this vaccine. Patient has been advised to call insurance company to determine out of pocket expense if they have not yet received this vaccine. Advised may also receive vaccine at local pharmacy  or Health Dept. Verbalized acceptance and understanding.  Screening Tests Health Maintenance  Topic Date Due   INFLUENZA VACCINE  12/23/2021   COVID-19 Vaccine (4 - Pfizer series) 02/26/2022 (Originally 12/19/2020)   Zoster Vaccines- Shingrix (1 of 2) 05/12/2022 (Originally [birth year]10/1991)   FOOT EXAM  03/31/2022   HEMOGLOBIN A1C  05/06/2022   Diabetic kidney evaluation - GFR measurement  11/05/2022   Diabetic kidney evaluation - Urine ACR  11/05/2022   OPHTHALMOLOGY EXAM  12/20/2022   TETANUS/TDAP  11/05/2031   Pneumonia Vaccine 42+ Years old  Completed   DEXA SCAN  Completed   HPV VACCINES  Aged Out    Health Maintenance  Health Maintenance Due  Topic Date Due   INFLUENZA VACCINE  12/23/2021    Colorectal cancer screening: No longer required.   Mammogram status: No longer required due to  .  Bone Density status: Ordered  . Pt provided with contact info and advised to call to schedule appt.  Lung Cancer Screening: (Low Dose CT Chest recommended if Age 55-80 years, 30 pack-year currently smoking OR have quit w/in 15years.) does not qualify.   Lung Cancer Screening Referral:   Additional Screening:  Hepatitis C Screening: does not qualify; Completed 2021  Vision Screening: Recommended annual ophthalmology exams for early detection of glaucoma and other disorders of the eye. Is the patient up to date with their annual eye exam?  Yes  Who is the provider or what is the name of the office in which the patient attends annual eye exams?  [place] If pt is not established with a provider, would they like to be referred to a provider to establish care? No .   Dental Screening: Recommended annual dental exams for proper oral hygiene  Community Resource Referral / Chronic Care Management: CRR required this visit?  No   CCM required this visit?  No      Plan:     I have personally reviewed and noted the following in the patient's chart:   Medical and social  history Use of alcohol, tobacco or illicit drugs  Current medications and supplements including opioid prescriptions. Patient is not currently taking opioid prescriptions. Functional ability and status Nutritional status Physical activity Advanced directives List of other physicians Hospitalizations, surgeries, and ER visits in previous 12 months Vitals Screenings to include cognitive, depression, and falls Referrals and appointments  In addition, I have reviewed and discussed with patient certain preventive protocols, quality metrics, and  best practice recommendations. A written personalized care plan for preventive services as well as general preventive health recommendations were provided to patient.     Leroy Kennedy, LPN   [birth year]   Nurse Notes:

## 2022-02-26 DIAGNOSIS — E782 Mixed hyperlipidemia: Secondary | ICD-10-CM | POA: Diagnosis not present

## 2022-02-26 DIAGNOSIS — R079 Chest pain, unspecified: Secondary | ICD-10-CM | POA: Diagnosis not present

## 2022-02-26 DIAGNOSIS — I10 Essential (primary) hypertension: Secondary | ICD-10-CM | POA: Diagnosis not present

## 2022-02-26 DIAGNOSIS — R0602 Shortness of breath: Secondary | ICD-10-CM | POA: Diagnosis not present

## 2022-02-26 DIAGNOSIS — I2510 Atherosclerotic heart disease of native coronary artery without angina pectoris: Secondary | ICD-10-CM | POA: Diagnosis not present

## 2022-02-26 DIAGNOSIS — Z955 Presence of coronary angioplasty implant and graft: Secondary | ICD-10-CM | POA: Diagnosis not present

## 2022-02-26 DIAGNOSIS — I6529 Occlusion and stenosis of unspecified carotid artery: Secondary | ICD-10-CM | POA: Diagnosis not present

## 2022-02-26 DIAGNOSIS — E118 Type 2 diabetes mellitus with unspecified complications: Secondary | ICD-10-CM | POA: Diagnosis not present

## 2022-03-23 ENCOUNTER — Encounter (INDEPENDENT_AMBULATORY_CARE_PROVIDER_SITE_OTHER): Payer: Self-pay

## 2022-05-06 ENCOUNTER — Encounter: Payer: Self-pay | Admitting: Family Medicine

## 2022-05-06 ENCOUNTER — Ambulatory Visit (INDEPENDENT_AMBULATORY_CARE_PROVIDER_SITE_OTHER): Payer: Medicare HMO | Admitting: Family Medicine

## 2022-05-06 VITALS — BP 128/64 | HR 63 | Temp 98.0°F | Wt 169.3 lb

## 2022-05-06 DIAGNOSIS — Z23 Encounter for immunization: Secondary | ICD-10-CM | POA: Diagnosis not present

## 2022-05-06 DIAGNOSIS — R252 Cramp and spasm: Secondary | ICD-10-CM | POA: Diagnosis not present

## 2022-05-06 DIAGNOSIS — E782 Mixed hyperlipidemia: Secondary | ICD-10-CM

## 2022-05-06 DIAGNOSIS — I25118 Atherosclerotic heart disease of native coronary artery with other forms of angina pectoris: Secondary | ICD-10-CM

## 2022-05-06 DIAGNOSIS — T466X5A Adverse effect of antihyperlipidemic and antiarteriosclerotic drugs, initial encounter: Secondary | ICD-10-CM | POA: Diagnosis not present

## 2022-05-06 DIAGNOSIS — E559 Vitamin D deficiency, unspecified: Secondary | ICD-10-CM | POA: Diagnosis not present

## 2022-05-06 DIAGNOSIS — F331 Major depressive disorder, recurrent, moderate: Secondary | ICD-10-CM | POA: Diagnosis not present

## 2022-05-06 DIAGNOSIS — I700 Atherosclerosis of aorta: Secondary | ICD-10-CM

## 2022-05-06 DIAGNOSIS — E118 Type 2 diabetes mellitus with unspecified complications: Secondary | ICD-10-CM | POA: Diagnosis not present

## 2022-05-06 DIAGNOSIS — I10 Essential (primary) hypertension: Secondary | ICD-10-CM | POA: Diagnosis not present

## 2022-05-06 DIAGNOSIS — D692 Other nonthrombocytopenic purpura: Secondary | ICD-10-CM

## 2022-05-06 DIAGNOSIS — R69 Illness, unspecified: Secondary | ICD-10-CM | POA: Diagnosis not present

## 2022-05-06 DIAGNOSIS — G720 Drug-induced myopathy: Secondary | ICD-10-CM

## 2022-05-06 LAB — URINALYSIS, ROUTINE W REFLEX MICROSCOPIC
Bilirubin, UA: NEGATIVE
Glucose, UA: NEGATIVE
Ketones, UA: NEGATIVE
Nitrite, UA: NEGATIVE
Protein,UA: NEGATIVE
RBC, UA: NEGATIVE
Specific Gravity, UA: 1.015 (ref 1.005–1.030)
Urobilinogen, Ur: 0.2 mg/dL (ref 0.2–1.0)
pH, UA: 7 (ref 5.0–7.5)

## 2022-05-06 LAB — BAYER DCA HB A1C WAIVED: HB A1C (BAYER DCA - WAIVED): 5.8 % — ABNORMAL HIGH (ref 4.8–5.6)

## 2022-05-06 MED ORDER — FENOFIBRATE 145 MG PO TABS: 145.0000 mg | ORAL_TABLET | Freq: Every day | ORAL | 1 refills | Status: DC

## 2022-05-06 MED ORDER — METFORMIN HCL 500 MG PO TABS: ORAL_TABLET | ORAL | 1 refills | Status: DC

## 2022-05-06 MED ORDER — PAROXETINE HCL 30 MG PO TABS: 60.0000 mg | ORAL_TABLET | Freq: Every day | ORAL | 1 refills | Status: DC

## 2022-05-06 MED ORDER — LOSARTAN POTASSIUM 50 MG PO TABS: 50.0000 mg | ORAL_TABLET | Freq: Every day | ORAL | 1 refills | Status: DC

## 2022-05-06 MED ORDER — OMEPRAZOLE 40 MG PO CPDR: 40.0000 mg | DELAYED_RELEASE_CAPSULE | Freq: Every day | ORAL | 1 refills | Status: DC

## 2022-05-06 MED ORDER — ISOSORBIDE MONONITRATE ER 60 MG PO TB24: 60.0000 mg | ORAL_TABLET | Freq: Every day | ORAL | 1 refills | Status: DC

## 2022-05-06 MED ORDER — TRAZODONE HCL 50 MG PO TABS: 50.0000 mg | ORAL_TABLET | Freq: Every day | ORAL | 1 refills | Status: DC

## 2022-05-06 MED ORDER — HYDROCHLOROTHIAZIDE 25 MG PO TABS: 25.0000 mg | ORAL_TABLET | Freq: Every day | ORAL | 1 refills | Status: DC

## 2022-05-06 MED ORDER — METOPROLOL SUCCINATE ER 25 MG PO TB24: 25.0000 mg | ORAL_TABLET | Freq: Every day | ORAL | 1 refills | Status: DC

## 2022-05-06 NOTE — Assessment & Plan Note (Signed)
Rechecking labs today. Cardiology attempting to get repatha covered. Continue to monitor.

## 2022-05-06 NOTE — Assessment & Plan Note (Signed)
Unable to tolerate statins. Cardiology attempting to get repatha covered. Continue to monitor.

## 2022-05-06 NOTE — Assessment & Plan Note (Signed)
Doing well with A1c of 5.8. Continue current regimen. Continue to monitor. Call with any concerns. Recheck 6 months. Would like to see nutrition. Referral generated today.

## 2022-05-06 NOTE — Patient Instructions (Addendum)
Nutritionist number: [phone] Vein Doctor: [phone]

## 2022-05-06 NOTE — Assessment & Plan Note (Signed)
Under good control on current regimen. Continue current regimen. Continue to monitor. Call with any concerns. Refills given. Labs drawn today.   

## 2022-05-06 NOTE — Assessment & Plan Note (Signed)
Under good control on current regimen. Continue current regimen. Continue to monitor. Call with any concerns. Refills given.   

## 2022-05-06 NOTE — Assessment & Plan Note (Signed)
Will keep BP and cholesterol under good control. Continue to monitor. Call with any concerns.  

## 2022-05-06 NOTE — Assessment & Plan Note (Signed)
Will keep BP and cholesterol under good control. Continue to follow with cardiology. Call with any concerns.

## 2022-05-06 NOTE — Assessment & Plan Note (Signed)
Stable. Continue to monitor. Call with any concerns.  ?

## 2022-05-06 NOTE — Assessment & Plan Note (Signed)
Rechecking labs today. Await results.  

## 2022-05-06 NOTE — Progress Notes (Signed)
BP 128/64   Pulse 63   Temp 98 F (36.7 C) (Oral)   Wt 169 lb 4.8 oz (76.8 kg)   SpO2 98%   BMI 29.99 kg/m    Subjective:    Patient ID: Susan Allen, female    DOB: May 29, 1940, 81 y.o.   MRN: [id number]  HPI: Susan Allen is a 81 y.o. female  Chief Complaint  Patient presents with   Diabetes   Hyperlipidemia   Hypertension   Coronary Artery Disease   DIABETES Hypoglycemic episodes:no Polydipsia/polyuria: no Visual disturbance: no Chest pain: no Paresthesias: no Glucose Monitoring: yes  Accucheck frequency:  occasionally Taking Insulin?: no Blood Pressure Monitoring: not checking Retinal Examination: Up to Date Foot Exam: Up to Date Diabetic Education: Completed Pneumovax: Up to Date Influenza: Up to Date Aspirin: no  HYPERTENSION / HYPERLIPIDEMIA Satisfied with current treatment? yes Duration of hypertension: chronic BP monitoring frequency: not checking BP medication side effects: no Past BP meds: losartan, imdur, HCTZ, metoprolol Duration of hyperlipidemia: chronic Cholesterol medication side effects: yes- myalgias with pravastatin Cholesterol supplements: none Past cholesterol medications: pravastatin, simvastatin, crestor, atorvastatin, fenofibrate Medication compliance: excellent compliance Aspirin: no Recent stressors: no Recurrent headaches: no Visual changes: no Palpitations: no Dyspnea: no Chest pain: no Lower extremity edema: no Dizzy/lightheaded: no  DEPRESSION Mood status: controlled Satisfied with current treatment?: yes Symptom severity: mild  Duration of current treatment : chronic Side effects: no Medication compliance: excellent compliance Psychotherapy/counseling: no  Previous psychiatric medications: paxil Depressed mood: yes Anxious mood: no Anhedonia: no Significant weight loss or gain: no Insomnia: no  Fatigue: no Feelings of worthlessness or guilt: no Impaired concentration/indecisiveness: no Suicidal ideations:  no Hopelessness: no Crying spells: no    05/06/2022    9:39 AM 02/10/2022   10:48 AM 11/04/2021   10:39 AM 03/31/2021    8:37 AM 02/03/2021   10:37 AM  Depression screen PHQ 2/9  Decreased Interest '2 2 3 1 '$ 0  Down, Depressed, Hopeless 2 1 0 1 0  PHQ - 2 Score '4 3 3 2 '$ 0  Altered sleeping 0 0 0 0   Tired, decreased energy 0 2 1 0   Change in appetite 0 0 0 0   Feeling bad or failure about yourself  0 0 0 0   Trouble concentrating 0 0 0 0   Moving slowly or fidgety/restless 0 0 0 0   Suicidal thoughts 0 0 0 0   PHQ-9 Score '4 5 4 2   '$ Difficult doing work/chores Not difficult at all Not difficult at all Not difficult at all      Relevant past medical, surgical, family and social history reviewed and updated as indicated. Interim medical history since our last visit reviewed. Allergies and medications reviewed and updated.  Review of Systems  Constitutional: Negative.   Respiratory: Negative.    Cardiovascular: Negative.   Gastrointestinal: Negative.   Musculoskeletal: Negative.        Leg cramps  Neurological: Negative.   Psychiatric/Behavioral: Negative.      Per HPI unless specifically indicated above     Objective:    BP 128/64   Pulse 63   Temp 98 F (36.7 C) (Oral)   Wt 169 lb 4.8 oz (76.8 kg)   SpO2 98%   BMI 29.99 kg/m   Wt Readings from Last 3 Encounters:  05/06/22 169 lb 4.8 oz (76.8 kg)  11/04/21 174 lb 9.6 oz (79.2 kg)  06/17/21 178 lb (80.7 kg)  Physical Exam Vitals and nursing note reviewed.  Constitutional:      General: She is not in acute distress.    Appearance: Normal appearance. She is not ill-appearing, toxic-appearing or diaphoretic.  HENT:     Head: Normocephalic and atraumatic.     Right Ear: External ear normal.     Left Ear: External ear normal.     Nose: Nose normal.     Mouth/Throat:     Mouth: Mucous membranes are moist.     Pharynx: Oropharynx is clear.  Eyes:     General: No scleral icterus.       Right eye: No discharge.         Left eye: No discharge.     Extraocular Movements: Extraocular movements intact.     Conjunctiva/sclera: Conjunctivae normal.     Pupils: Pupils are equal, round, and reactive to light.  Cardiovascular:     Rate and Rhythm: Normal rate and regular rhythm.     Pulses: Normal pulses.     Heart sounds: Normal heart sounds. No murmur heard.    No friction rub. No gallop.  Pulmonary:     Effort: Pulmonary effort is normal. No respiratory distress.     Breath sounds: Normal breath sounds. No stridor. No wheezing, rhonchi or rales.  Chest:     Chest wall: No tenderness.  Musculoskeletal:        General: Normal range of motion.     Cervical back: Normal range of motion and neck supple.  Skin:    General: Skin is warm and dry.     Capillary Refill: Capillary refill takes less than 2 seconds.     Coloration: Skin is not jaundiced or pale.     Findings: No bruising, erythema, lesion or rash.  Neurological:     General: No focal deficit present.     Mental Status: She is alert and oriented to person, place, and time. Mental status is at baseline.  Psychiatric:        Mood and Affect: Mood normal.        Behavior: Behavior normal.        Thought Content: Thought content normal.        Judgment: Judgment normal.     Results for orders placed or performed in visit on 01/06/22  HM DIABETES EYE EXAM  Result Value Ref Range   HM Diabetic Eye Exam No Retinopathy No Retinopathy      Assessment & Plan:   Problem List Items Addressed This Visit       Cardiovascular and Mediastinum   Essential hypertension    Under good control on current regimen. Continue current regimen. Continue to monitor. Call with any concerns. Refills given. Labs drawn today.        Relevant Medications   fenofibrate (TRICOR) 145 MG tablet   hydrochlorothiazide (HYDRODIURIL) 25 MG tablet   isosorbide mononitrate (IMDUR) 60 MG 24 hr tablet   losartan (COZAAR) 50 MG tablet   metoprolol succinate (TOPROL  XL) 25 MG 24 hr tablet   Other Relevant Orders   CBC with Differential/Platelet   Comprehensive metabolic panel   Coronary artery disease of native artery of native heart with stable angina pectoris (HCC)    Will keep BP and cholesterol under good control. Continue to follow with cardiology. Call with any concerns.       Relevant Medications   fenofibrate (TRICOR) 145 MG tablet   hydrochlorothiazide (HYDRODIURIL) 25 MG tablet   isosorbide mononitrate (  IMDUR) 60 MG 24 hr tablet   losartan (COZAAR) 50 MG tablet   metoprolol succinate (TOPROL XL) 25 MG 24 hr tablet   PARoxetine (PAXIL) 30 MG tablet   traZODone (DESYREL) 50 MG tablet   Other Relevant Orders   Ambulatory referral to Vascular Surgery   Senile purpura (HCC)    Stable. Continue to monitor. Call with any concerns.       Relevant Medications   fenofibrate (TRICOR) 145 MG tablet   hydrochlorothiazide (HYDRODIURIL) 25 MG tablet   isosorbide mononitrate (IMDUR) 60 MG 24 hr tablet   losartan (COZAAR) 50 MG tablet   metoprolol succinate (TOPROL XL) 25 MG 24 hr tablet   Aortic atherosclerosis (HCC)    Will keep BP and cholesterol under good control. Continue to monitor. Call with any concerns.       Relevant Medications   fenofibrate (TRICOR) 145 MG tablet   hydrochlorothiazide (HYDRODIURIL) 25 MG tablet   isosorbide mononitrate (IMDUR) 60 MG 24 hr tablet   losartan (COZAAR) 50 MG tablet   metoprolol succinate (TOPROL XL) 25 MG 24 hr tablet     Endocrine   Type 2 diabetes mellitus with complication, without long-term current use of insulin (HCC) - Primary    Doing well with A1c of 5.8. Continue current regimen. Continue to monitor. Call with any concerns. Recheck 6 months. Would like to see nutrition. Referral generated today.      Relevant Medications   losartan (COZAAR) 50 MG tablet   metFORMIN (GLUCOPHAGE) 500 MG tablet   Other Relevant Orders   Bayer DCA Hb A1c Waived   CBC with Differential/Platelet    Comprehensive metabolic panel   Lipid Panel w/o Chol/HDL Ratio   TSH   Urinalysis, Routine w reflex microscopic   Amb ref to Medical Nutrition Therapy-MNT     Musculoskeletal and Integument   Statin myopathy    Unable to tolerate statins. Cardiology attempting to get repatha covered. Continue to monitor.         Other   Hyperlipidemia    Rechecking labs today. Cardiology attempting to get repatha covered. Continue to monitor.       Relevant Medications   fenofibrate (TRICOR) 145 MG tablet   hydrochlorothiazide (HYDRODIURIL) 25 MG tablet   isosorbide mononitrate (IMDUR) 60 MG 24 hr tablet   losartan (COZAAR) 50 MG tablet   metoprolol succinate (TOPROL XL) 25 MG 24 hr tablet   Other Relevant Orders   CBC with Differential/Platelet   Comprehensive metabolic panel   Lipid Panel w/o Chol/HDL Ratio   Moderate episode of recurrent major depressive disorder (HCC)    Under good control on current regimen. Continue current regimen. Continue to monitor. Call with any concerns. Refills given.        Relevant Medications   PARoxetine (PAXIL) 30 MG tablet   traZODone (DESYREL) 50 MG tablet   Other Relevant Orders   CBC with Differential/Platelet   Comprehensive metabolic panel   Vitamin D deficiency    Rechecking labs today. Await results.       Relevant Orders   CBC with Differential/Platelet   Comprehensive metabolic panel   VITAMIN D 25 Hydroxy (Vit-D Deficiency, Fractures)   Other Visit Diagnoses     Leg cramps       Relevant Orders   Ambulatory referral to Vascular Surgery   Flu vaccine need       Relevant Orders   Flu Vaccine QUAD High Dose(Fluad) (Completed)        Follow  up plan: Return in about 6 months (around 11/05/2022) for physical.

## 2022-05-07 LAB — CBC WITH DIFFERENTIAL/PLATELET
Basophils Absolute: 0.1 10*3/uL (ref 0.0–0.2)
Basos: 1 %
EOS (ABSOLUTE): 0.2 10*3/uL (ref 0.0–0.4)
Eos: 3 %
Hematocrit: 36.2 % (ref 34.0–46.6)
Hemoglobin: 11.8 g/dL (ref 11.1–15.9)
Immature Grans (Abs): 0 10*3/uL (ref 0.0–0.1)
Immature Granulocytes: 0 %
Lymphocytes Absolute: 1.4 10*3/uL (ref 0.7–3.1)
Lymphs: 28 %
MCH: 27.4 pg (ref 26.6–33.0)
MCHC: 32.6 g/dL (ref 31.5–35.7)
MCV: 84 fL (ref 79–97)
Monocytes Absolute: 0.6 10*3/uL (ref 0.1–0.9)
Monocytes: 12 %
Neutrophils Absolute: 2.7 10*3/uL (ref 1.4–7.0)
Neutrophils: 56 %
RBC: 4.3 x10E6/uL (ref 3.77–5.28)
RDW: 13.2 % (ref 11.7–15.4)
WBC: 5 10*3/uL (ref 3.4–10.8)

## 2022-05-07 LAB — COMPREHENSIVE METABOLIC PANEL
ALT: 7 IU/L (ref 0–32)
AST: 14 IU/L (ref 0–40)
Albumin/Globulin Ratio: 1.9 (ref 1.2–2.2)
Albumin: 3.9 g/dL (ref 3.8–4.8)
Alkaline Phosphatase: 38 IU/L — ABNORMAL LOW (ref 44–121)
BUN/Creatinine Ratio: 20 (ref 12–28)
BUN: 18 mg/dL (ref 8–27)
Bilirubin Total: 0.2 mg/dL (ref 0.0–1.2)
CO2: 27 mmol/L (ref 20–29)
Calcium: 9.2 mg/dL (ref 8.7–10.3)
Chloride: 91 mmol/L — ABNORMAL LOW (ref 96–106)
Creatinine, Ser: 0.88 mg/dL (ref 0.57–1.00)
Globulin, Total: 2.1 g/dL (ref 1.5–4.5)
Glucose: 98 mg/dL (ref 70–99)
Potassium: 3.9 mmol/L (ref 3.5–5.2)
Sodium: 132 mmol/L — ABNORMAL LOW (ref 134–144)
Total Protein: 6 g/dL (ref 6.0–8.5)
eGFR: 66 mL/min/{1.73_m2} (ref >59)

## 2022-05-07 LAB — LIPID PANEL W/O CHOL/HDL RATIO
Cholesterol, Total: 206 mg/dL — ABNORMAL HIGH (ref 100–199)
HDL: 57 mg/dL (ref >39)
LDL Chol Calc (NIH): 122 mg/dL — ABNORMAL HIGH (ref 0–99)
Triglycerides: 153 mg/dL — ABNORMAL HIGH (ref 0–149)
VLDL Cholesterol Cal: 27 mg/dL (ref 5–40)

## 2022-05-07 LAB — TSH: TSH: 3.12 u[IU]/mL (ref 0.450–4.500)

## 2022-05-07 LAB — VITAMIN D 25 HYDROXY (VIT D DEFICIENCY, FRACTURES): Vit D, 25-Hydroxy: 34.6 ng/mL (ref 30.0–100.0)

## 2022-08-20 ENCOUNTER — Ambulatory Visit (INDEPENDENT_AMBULATORY_CARE_PROVIDER_SITE_OTHER): Payer: PPO | Admitting: Vascular Surgery

## 2022-08-27 DIAGNOSIS — E782 Mixed hyperlipidemia: Secondary | ICD-10-CM | POA: Diagnosis not present

## 2022-08-27 DIAGNOSIS — I2510 Atherosclerotic heart disease of native coronary artery without angina pectoris: Secondary | ICD-10-CM | POA: Diagnosis not present

## 2022-08-27 DIAGNOSIS — I700 Atherosclerosis of aorta: Secondary | ICD-10-CM | POA: Diagnosis not present

## 2022-08-27 DIAGNOSIS — E118 Type 2 diabetes mellitus with unspecified complications: Secondary | ICD-10-CM | POA: Diagnosis not present

## 2022-08-27 DIAGNOSIS — I6529 Occlusion and stenosis of unspecified carotid artery: Secondary | ICD-10-CM | POA: Diagnosis not present

## 2022-08-27 DIAGNOSIS — I10 Essential (primary) hypertension: Secondary | ICD-10-CM | POA: Diagnosis not present

## 2022-08-27 DIAGNOSIS — I25118 Atherosclerotic heart disease of native coronary artery with other forms of angina pectoris: Secondary | ICD-10-CM | POA: Diagnosis not present

## 2022-09-07 ENCOUNTER — Ambulatory Visit (INDEPENDENT_AMBULATORY_CARE_PROVIDER_SITE_OTHER): Payer: PPO | Admitting: Vascular Surgery

## 2022-10-12 ENCOUNTER — Ambulatory Visit (INDEPENDENT_AMBULATORY_CARE_PROVIDER_SITE_OTHER): Payer: PPO | Admitting: Vascular Surgery

## 2022-11-05 ENCOUNTER — Encounter: Payer: Medicare HMO | Admitting: Family Medicine

## 2022-11-18 ENCOUNTER — Encounter: Payer: Self-pay | Admitting: Family Medicine

## 2022-11-18 ENCOUNTER — Ambulatory Visit (INDEPENDENT_AMBULATORY_CARE_PROVIDER_SITE_OTHER): Payer: Medicare HMO | Admitting: Family Medicine

## 2022-11-18 VITALS — BP 120/69 | HR 66 | Temp 97.9°F | Ht 62.21 in | Wt 164.6 lb

## 2022-11-18 DIAGNOSIS — Z0000 Encounter for general adult medical examination without abnormal findings: Secondary | ICD-10-CM

## 2022-11-18 DIAGNOSIS — F331 Major depressive disorder, recurrent, moderate: Secondary | ICD-10-CM

## 2022-11-18 DIAGNOSIS — T466X5A Adverse effect of antihyperlipidemic and antiarteriosclerotic drugs, initial encounter: Secondary | ICD-10-CM | POA: Diagnosis not present

## 2022-11-18 DIAGNOSIS — Z7984 Long term (current) use of oral hypoglycemic drugs: Secondary | ICD-10-CM

## 2022-11-18 DIAGNOSIS — I25118 Atherosclerotic heart disease of native coronary artery with other forms of angina pectoris: Secondary | ICD-10-CM

## 2022-11-18 DIAGNOSIS — G720 Drug-induced myopathy: Secondary | ICD-10-CM

## 2022-11-18 DIAGNOSIS — Z13820 Encounter for screening for osteoporosis: Secondary | ICD-10-CM

## 2022-11-18 DIAGNOSIS — E559 Vitamin D deficiency, unspecified: Secondary | ICD-10-CM

## 2022-11-18 DIAGNOSIS — E118 Type 2 diabetes mellitus with unspecified complications: Secondary | ICD-10-CM

## 2022-11-18 DIAGNOSIS — I700 Atherosclerosis of aorta: Secondary | ICD-10-CM | POA: Diagnosis not present

## 2022-11-18 DIAGNOSIS — D692 Other nonthrombocytopenic purpura: Secondary | ICD-10-CM | POA: Diagnosis not present

## 2022-11-18 DIAGNOSIS — I10 Essential (primary) hypertension: Secondary | ICD-10-CM | POA: Diagnosis not present

## 2022-11-18 DIAGNOSIS — J40 Bronchitis, not specified as acute or chronic: Secondary | ICD-10-CM

## 2022-11-18 DIAGNOSIS — E8809 Other disorders of plasma-protein metabolism, not elsewhere classified: Secondary | ICD-10-CM | POA: Diagnosis not present

## 2022-11-18 DIAGNOSIS — E782 Mixed hyperlipidemia: Secondary | ICD-10-CM

## 2022-11-18 LAB — URINALYSIS, ROUTINE W REFLEX MICROSCOPIC
Bilirubin, UA: NEGATIVE
Glucose, UA: NEGATIVE
Ketones, UA: NEGATIVE
Leukocytes,UA: NEGATIVE
Nitrite, UA: NEGATIVE
Protein,UA: NEGATIVE
RBC, UA: NEGATIVE
Specific Gravity, UA: 1.015 (ref 1.005–1.030)
Urobilinogen, Ur: 2 mg/dL — ABNORMAL HIGH (ref 0.2–1.0)
pH, UA: 7.5 (ref 5.0–7.5)

## 2022-11-18 LAB — MICROALBUMIN, URINE WAIVED
Creatinine, Urine Waived: 50 mg/dL (ref 10–300)
Microalb, Ur Waived: 30 mg/L — ABNORMAL HIGH (ref 0–19)

## 2022-11-18 LAB — BAYER DCA HB A1C WAIVED: HB A1C (BAYER DCA - WAIVED): 6.2 % — ABNORMAL HIGH (ref 4.8–5.6)

## 2022-11-18 MED ORDER — LOSARTAN POTASSIUM 50 MG PO TABS: 50.0000 mg | ORAL_TABLET | Freq: Every day | ORAL | 1 refills | Status: DC

## 2022-11-18 MED ORDER — PREDNISONE 50 MG PO TABS: 50.0000 mg | ORAL_TABLET | Freq: Every day | ORAL | 0 refills | Status: DC

## 2022-11-18 MED ORDER — ISOSORBIDE MONONITRATE ER 60 MG PO TB24: 60.0000 mg | ORAL_TABLET | Freq: Every day | ORAL | 1 refills | Status: DC

## 2022-11-18 MED ORDER — HYDROCHLOROTHIAZIDE 25 MG PO TABS: 25.0000 mg | ORAL_TABLET | Freq: Every day | ORAL | 1 refills | Status: DC

## 2022-11-18 MED ORDER — OMEPRAZOLE 40 MG PO CPDR: 40.0000 mg | DELAYED_RELEASE_CAPSULE | Freq: Every day | ORAL | 1 refills | Status: DC

## 2022-11-18 MED ORDER — TRAZODONE HCL 50 MG PO TABS: 50.0000 mg | ORAL_TABLET | Freq: Every day | ORAL | 1 refills | Status: DC

## 2022-11-18 MED ORDER — PAROXETINE HCL 30 MG PO TABS: 60.0000 mg | ORAL_TABLET | Freq: Every day | ORAL | 1 refills | Status: DC

## 2022-11-18 MED ORDER — METFORMIN HCL 500 MG PO TABS: ORAL_TABLET | ORAL | 1 refills | Status: DC

## 2022-11-18 MED ORDER — TRIAMCINOLONE ACETONIDE 40 MG/ML IJ SUSP
40.0000 mg | Freq: Once | INTRAMUSCULAR | Status: AC
Start: 2022-11-18 — End: 2022-11-18
  Administered 2022-11-18: 40 mg via INTRAMUSCULAR

## 2022-11-18 MED ORDER — METOPROLOL SUCCINATE ER 25 MG PO TB24: 25.0000 mg | ORAL_TABLET | Freq: Every day | ORAL | 1 refills | Status: DC

## 2022-11-18 MED ORDER — CLOPIDOGREL BISULFATE 75 MG PO TABS: 75.0000 mg | ORAL_TABLET | Freq: Every day | ORAL | 3 refills | Status: DC

## 2022-11-18 MED ORDER — FENOFIBRATE 145 MG PO TABS: 145.0000 mg | ORAL_TABLET | Freq: Every day | ORAL | 1 refills | Status: DC

## 2022-11-18 NOTE — Assessment & Plan Note (Signed)
Rechecking labs today. Await results. Treat as needed.  °

## 2022-11-18 NOTE — Assessment & Plan Note (Signed)
Doing well with A1c of 6.2. Continue diet and exercise. Call with any concerns. Continue to monitor.

## 2022-11-18 NOTE — Assessment & Plan Note (Signed)
Will keep BP and cholesterol under good control. Continue to monitor. Continue to follow with cardiology. Call with any concerns.

## 2022-11-18 NOTE — Assessment & Plan Note (Signed)
Under good control on current regimen. Continue current regimen. Continue to monitor. Call with any concerns. Refills given. Labs drawn today.   

## 2022-11-18 NOTE — Assessment & Plan Note (Signed)
Will keep BP and cholesterol under good control. Continue to monitor. Continue to follow with cardiology. Call with any concerns.  

## 2022-11-18 NOTE — Assessment & Plan Note (Signed)
Unable to tolerate statins. Continue fenofibrate. Continue to monitor. Call with any concerns.

## 2022-11-18 NOTE — Assessment & Plan Note (Signed)
Reassured patient. Continue to monitor.  

## 2022-11-18 NOTE — Progress Notes (Signed)
BP 120/69   Pulse 66   Temp 97.9 F (36.6 C) (Oral)   Ht 5' 2.21" (1.58 m)   Wt 164 lb 9.6 oz (74.7 kg)   BMI 29.91 kg/m    Subjective:    Patient ID: Susan Allen, female    DOB: [birth year], 82 y.o.   MRN: [id number]  HPI: Susan Allen is a 82 y.o. female presenting on 11/18/2022 for comprehensive medical examination. Current medical complaints include:  UPPER RESPIRATORY TRACT INFECTION Duration: couple of day Worst symptom: chest congestion Fever: no Cough: yes Shortness of breath: no Wheezing: no Chest pain: yes, with cough Chest tightness: yes Chest congestion: yes Nasal congestion: no Runny nose: yes Post nasal drip: no Sneezing: no Sore throat: no Swollen glands: no Sinus pressure: no Headache: yes Face pain: no Toothache: no Ear pain: no  Ear pressure: no  Eyes red/itching:no Eye drainage/crusting: no  Vomiting: no Rash: no Fatigue: yes Sick contacts: no Strep contacts: no  Context: stable Recurrent sinusitis: no Relief with OTC cold/cough medications: no  Treatments attempted: mucinex   HYPERTENSION / HYPERLIPIDEMIA Satisfied with current treatment? yes Duration of hypertension: chronic BP monitoring frequency: not checking BP medication side effects: no Past BP meds: losartan, imdur, hydrochlorothiazide, metoprolol Duration of hyperlipidemia: chronic Cholesterol medication side effects: no Cholesterol supplements: none Past cholesterol medications: fenofibrate Medication compliance: excellent compliance Aspirin: no Recent stressors: no Recurrent headaches: no Visual changes: no Palpitations: no Dyspnea: no Chest pain: no Lower extremity edema: no Dizzy/lightheaded: no  DIABETES Hypoglycemic episodes:no Polydipsia/polyuria: no Visual disturbance: no Chest pain: no Paresthesias: no Glucose Monitoring: yes Taking Insulin?: no Blood Pressure Monitoring: not checking Retinal Examination: Up to Date Foot Exam: Up to Date Diabetic  Education: Completed Pneumovax: Up to Date Influenza: Up to Date Aspirin: no  DEPRESSION Mood status: controlled Satisfied with current treatment?: yes Symptom severity: mild  Duration of current treatment : chronic Side effects: no Medication compliance: excellent compliance Psychotherapy/counseling: no  Previous psychiatric medications: paxil Depressed mood: no Anxious mood: no Anhedonia: no Significant weight loss or gain: no Insomnia: no  Fatigue: yes Feelings of worthlessness or guilt: yes Impaired concentration/indecisiveness: no Suicidal ideations: no Hopelessness: no Crying spells: no    11/18/2022   11:17 AM 05/06/2022    9:39 AM 02/10/2022   10:48 AM 11/04/2021   10:39 AM 03/31/2021    8:37 AM  Depression screen PHQ 2/9  Decreased Interest 2 2 2 3 1   Down, Depressed, Hopeless 2 2 1  0 1  PHQ - 2 Score 4 4 3 3 2   Altered sleeping 1 0 0 0 0  Tired, decreased energy 2 0 2 1 0  Change in appetite 2 0 0 0 0  Feeling bad or failure about yourself  2 0 0 0 0  Trouble concentrating 0 0 0 0 0  Moving slowly or fidgety/restless 0 0 0 0 0  Suicidal thoughts 0 0 0 0 0  PHQ-9 Score 11 4 5 4 2   Difficult doing work/chores Somewhat difficult Not difficult at all Not difficult at all Not difficult at all    She currently lives with: husband Menopausal Symptoms: no  Depression Screen done today and results listed below:     11/18/2022   11:17 AM 05/06/2022    9:39 AM 02/10/2022   10:48 AM 11/04/2021   10:39 AM 03/31/2021    8:37 AM  Depression screen PHQ 2/9  Decreased Interest 2 2 2 3 1   Down, Depressed, Hopeless  2 2 1  0 1  PHQ - 2 Score 4 4 3 3 2   Altered sleeping 1 0 0 0 0  Tired, decreased energy 2 0 2 1 0  Change in appetite 2 0 0 0 0  Feeling bad or failure about yourself  2 0 0 0 0  Trouble concentrating 0 0 0 0 0  Moving slowly or fidgety/restless 0 0 0 0 0  Suicidal thoughts 0 0 0 0 0  PHQ-9 Score 11 4 5 4 2   Difficult doing work/chores Somewhat difficult  Not difficult at all Not difficult at all Not difficult at all     Past Medical History:  Past Medical History:  Diagnosis Date   Allergy    Arthritis    Bronchitis    Coronary artery disease    Depression    Diabetes mellitus without complication (HCC)    GERD (gastroesophageal reflux disease)    Headache    Hypertension    Myocardial infarction [facility]) June 2014, July 2014    Surgical History:  Past Surgical History:  Procedure Laterality Date   APPENDECTOMY     CATARACT EXTRACTION W/PHACO Right 04/22/2021   Procedure: CATARACT EXTRACTION PHACO AND INTRAOCULAR LENS PLACEMENT (IOC) RIGHT DIABETIC 23.38 02:10.8;  Surgeon: Galen Manila, MD;  Location: [facility];  Service: Ophthalmology;  Laterality: Right;  Diabetic   CATARACT EXTRACTION W/PHACO Left 06/17/2021   Procedure: CATARACT EXTRACTION PHACO AND INTRAOCULAR LENS PLACEMENT (IOC) LEFT DIABETIC 6.39 00:38.2;  Surgeon: Galen Manila, MD;  Location: [facility];  Service: Ophthalmology;  Laterality: Left;   CORONARY ANGIOPLASTY     with stents    DIGIT NAIL REMOVAL Bilateral 08/30/2015   Procedure: REMOVAL OF DIGIT NAILS/ BIL. PERM.REMOVAL INGROWN GREAT TOE NAILS;  Surgeon: Recardo Evangelist, DPM;  Location: [facility] ORS;  Service: Podiatry;  Laterality: Bilateral;   EXCISION MORTON'S NEUROMA Right 08/30/2015   Procedure: EXCISION MORTON'S NEUROMA;  Surgeon: Recardo Evangelist, DPM;  Location: [facility] ORS;  Service: Podiatry;  Laterality: Right;   JOINT REPLACEMENT Bilateral    Total Knee Replacement, Dr Jinny Sanders, [facility]   LEFT HEART CATH AND CORONARY ANGIOGRAPHY N/A 02/06/2021   Procedure: LEFT HEART CATH AND CORONARY ANGIOGRAPHY;  Surgeon: Armando Reichert, MD;  Location: [facility] IN[facility]ASIVE [facility] LAB;  Service: Cardiovascular;  Laterality: N/A;   TONSILLECTOMY     TOTAL ABDOMINAL HYSTERECTOMY      Medications:  Current Outpatient Medications on File Prior to Visit  Medication Sig   Blood  Glucose Calibration (OT ULTRA/FASTTK CNTRL SOLN) SOLN See admin instructions.   Blood Glucose Monitoring Suppl (FREESTYLE LITE) DEVI USE TO CHECK GLUCOSE ONCE DAILY   cetirizine (ZYRTEC) 10 MG tablet Take 10 mg by mouth daily.   diclofenac Sodium (VOLTAREN) 1 % GEL APPLY 4 GRAMS TOPICALLY 4 TIMES DAILY (Patient taking differently: Apply 4 g topically 4 (four) times daily as needed (pain).)   diphenhydrAMINE (BENADRYL ALLERGY) 25 MG tablet Take 25 mg by mouth every 6 (six) hours as needed.   Easy Comfort Lancets MISC 3 (three) times daily. for testing   EPINEPHrine 0.3 mg/0.3 mL IJ SOAJ injection See admin instructions.   FREESTYLE LITE test strip SMARTSIG:Strip(s) Via Meter Daily   Lancet Devices (EASY MINI EJECT LANCING DEVICE) MISC See admin instructions.   nitroGLYCERIN (NITROSTAT) 0.4 MG SL tablet Place 1 tablet (0.4 mg total) under the tongue every 5 (five) minutes as needed for chest pain.   Simethicone (GAS-X PO) Take 1 tablet by  mouth daily as needed (flatulence).   triamcinolone cream (KENALOG) 0.1 % Apply 1 application  topically daily as needed (irritation).   No current facility-administered medications on file prior to visit.    Allergies:  Allergies  Allergen Reactions   Alpha-Gal Hives    Red meat   Aspirin Hives and Other (See Comments)    "fainting"   Lisinopril Cough   Statins Other (See Comments)    Leg cramps    Social History:  Social History   Socioeconomic History   Marital status: Married    Spouse name: Not on file   Number of children: Not on file   Years of education: Not on file   Highest education level: Not on file  Occupational History   Occupation: retired  Tobacco Use   Smoking status: Former    Packs/day: .25    Types: Cigarettes    Quit date: 02/27/2004    Years since quitting: 18.7   Smokeless tobacco: Never  Vaping Use   Vaping Use: Former  Substance and Sexual Activity   Alcohol use: No   Drug use: No   Sexual activity: Not  Currently  Other Topics Concern   Not on file  Social History Narrative   Not on file   Social Determinants of Health   Financial Resource Strain: Low Risk  (02/10/2022)   Overall Financial Resource Strain (CARDIA)    Difficulty of Paying Living Expenses: Not hard at all  Food Insecurity: No Food Insecurity (02/10/2022)   Hunger Vital Sign    Worried About Running Out of Food in the Last Year: Never true    Ran Out of Food in the Last Year: Never true  Transportation Needs: No Transportation Needs (02/10/2022)   PRAPARE - Administrator, Civil Service (Medical): No    Lack of Transportation (Non-Medical): No  Physical Activity: Insufficiently Active (02/10/2022)   Exercise Vital Sign    Days of Exercise per Week: 3 days    Minutes of Exercise per Session: 40 min  Stress: No Stress Concern Present (02/10/2022)   [employer] of Occupational Health - Occupational Stress Questionnaire    Feeling of Stress : Not at all  Social Connections: Socially Integrated (02/10/2022)   Social Connection and Isolation Panel [NHANES]    Frequency of Communication with Friends and Family: More than three times a week    Frequency of Social Gatherings with Friends and Family: Three times a week    Attends Religious Services: More than 4 times per year    Active Member of Clubs or Organizations: Yes    Attends Banker Meetings: More than 4 times per year    Marital Status: Married  Catering manager Violence: Not At Risk (02/10/2022)   Humiliation, Afraid, Rape, and Kick questionnaire    Fear of Current or Ex-Partner: No    Emotionally Abused: No    Physically Abused: No    Sexually Abused: No   Social History   Tobacco Use  Smoking Status Former   Packs/day: .25   Types: Cigarettes   Quit date: 02/27/2004   Years since quitting: 18.7  Smokeless Tobacco Never   Social History   Substance and Sexual Activity  Alcohol Use No    Family History:  Family History   Problem Relation Age of Onset   Colon cancer Mother 29   Cancer Mother        colon   Cancer Father  lung   Cancer Sister        ovarian   Breast cancer Neg Hx     Past medical history, surgical history, medications, allergies, family history and social history reviewed with patient today and changes made to appropriate areas of the chart.   Review of Systems  Constitutional:  Positive for diaphoresis. Negative for chills, fever, malaise/fatigue and weight loss.  HENT:  Positive for congestion. Negative for ear discharge, ear pain, hearing loss, nosebleeds, sinus pain, sore throat and tinnitus.   Eyes: Negative.   Respiratory:  Positive for cough and shortness of breath. Negative for hemoptysis, sputum production, wheezing and stridor.   Cardiovascular:  Positive for palpitations. Negative for chest pain, orthopnea, claudication, leg swelling and PND.  Gastrointestinal:  Positive for nausea. Negative for abdominal pain, blood in stool, constipation, diarrhea, heartburn, melena and vomiting.  Genitourinary: Negative.   Musculoskeletal: Negative.   Skin: Negative.   Endo/Heme/Allergies:  Negative for environmental allergies and polydipsia. Bruises/bleeds easily.   All other ROS negative except what is listed above and in the HPI.      Objective:    BP 120/69   Pulse 66   Temp 97.9 F (36.6 C) (Oral)   Ht 5' 2.21" (1.58 m)   Wt 164 lb 9.6 oz (74.7 kg)   BMI 29.91 kg/m   Wt Readings from Last 3 Encounters:  11/18/22 164 lb 9.6 oz (74.7 kg)  05/06/22 169 lb 4.8 oz (76.8 kg)  11/04/21 174 lb 9.6 oz (79.2 kg)    Physical Exam Vitals and nursing note reviewed.  Constitutional:      General: She is not in acute distress.    Appearance: Normal appearance. She is not ill-appearing, toxic-appearing or diaphoretic.  HENT:     Head: Normocephalic and atraumatic.     Right Ear: Tympanic membrane, ear canal and external ear normal. There is no impacted cerumen.     Left  Ear: Tympanic membrane, ear canal and external ear normal. There is no impacted cerumen.     Nose: Nose normal. No congestion or rhinorrhea.     Mouth/Throat:     Mouth: Mucous membranes are moist.     Pharynx: Oropharynx is clear. No oropharyngeal exudate or posterior oropharyngeal erythema.  Eyes:     General: No scleral icterus.       Right eye: No discharge.        Left eye: No discharge.     Extraocular Movements: Extraocular movements intact.     Conjunctiva/sclera: Conjunctivae normal.     Pupils: Pupils are equal, round, and reactive to light.  Neck:     Vascular: No carotid bruit.  Cardiovascular:     Rate and Rhythm: Normal rate and regular rhythm.     Pulses: Normal pulses.     Heart sounds: No murmur heard.    No friction rub. No gallop.  Pulmonary:     Effort: Pulmonary effort is normal. No respiratory distress.     Breath sounds: Normal breath sounds. No stridor. No wheezing, rhonchi or rales.  Chest:     Chest wall: No tenderness.  Abdominal:     General: Abdomen is flat. Bowel sounds are normal. There is no distension.     Palpations: Abdomen is soft. There is no mass.     Tenderness: There is no abdominal tenderness. There is no right CVA tenderness, left CVA tenderness, guarding or rebound.     Hernia: No hernia is present.  Genitourinary:  Comments: Breast and pelvic exams deferred with shared decision making Musculoskeletal:        General: No swelling, tenderness, deformity or signs of injury.     Cervical back: Normal range of motion and neck supple. No rigidity. No muscular tenderness.     Right lower leg: No edema.     Left lower leg: No edema.  Lymphadenopathy:     Cervical: No cervical adenopathy.  Skin:    General: Skin is warm and dry.     Capillary Refill: Capillary refill takes less than 2 seconds.     Coloration: Skin is not jaundiced or pale.     Findings: No bruising, erythema, lesion or rash.  Neurological:     General: No focal deficit  present.     Mental Status: She is alert and oriented to person, place, and time. Mental status is at baseline.     Cranial Nerves: No cranial nerve deficit.     Sensory: No sensory deficit.     Motor: No weakness.     Coordination: Coordination normal.     Gait: Gait normal.     Deep Tendon Reflexes: Reflexes normal.  Psychiatric:        Mood and Affect: Mood normal.        Behavior: Behavior normal.        Thought Content: Thought content normal.        Judgment: Judgment normal.      Results for orders placed or performed in visit on 11/18/22  Urinalysis, Routine w reflex microscopic  Result Value Ref Range   Specific Gravity, UA 1.015 1.005 - 1.030   pH, UA 7.5 5.0 - 7.5   Color, UA Yellow Yellow   Appearance Ur Clear Clear   Leukocytes,UA Negative Negative   Protein,UA Negative Negative/Trace   Glucose, UA Negative Negative   Ketones, UA Negative Negative   RBC, UA Negative Negative   Bilirubin, UA Negative Negative   Urobilinogen, Ur 2.0 (H) 0.2 - 1.0 mg/dL   Nitrite, UA Negative Negative   Microscopic Examination Comment   Microalbumin, Urine Waived  Result Value Ref Range   Microalb, Ur Waived 30 (H) 0 - 19 mg/L   Creatinine, Urine Waived 50 10 - 300 mg/dL   Microalb/Creat Ratio 30-300 (H) <30 mg/g  Bayer DCA Hb A1c Waived  Result Value Ref Range   HB A1C (BAYER DCA - WAIVED) 6.2 (H) 4.8 - 5.6 %  HM HEPATITIS C SCREENING LAB  Result Value Ref Range   HM Hepatitis Screen Negative-Validated       Assessment & Plan:   Problem List Items Addressed This Visit       Cardiovascular and Mediastinum   Essential hypertension    Under good control on current regimen. Continue current regimen. Continue to monitor. Call with any concerns. Refills given. Labs drawn today.       Relevant Medications   fenofibrate (TRICOR) 145 MG tablet   hydrochlorothiazide (HYDRODIURIL) 25 MG tablet   isosorbide mononitrate (IMDUR) 60 MG 24 hr tablet   losartan (COZAAR) 50 MG  tablet   metoprolol succinate (TOPROL XL) 25 MG 24 hr tablet   Other Relevant Orders   CBC with Differential/Platelet   Comprehensive metabolic panel   Urinalysis, Routine w reflex microscopic (Completed)   TSH   Microalbumin, Urine Waived (Completed)   Coronary artery disease of native artery of native heart with stable angina pectoris (HCC)    Will keep BP and cholesterol under good  control. Continue to monitor. Continue to follow with cardiology. Call with any concerns.       Relevant Medications   predniSONE (DELTASONE) 50 MG tablet   fenofibrate (TRICOR) 145 MG tablet   hydrochlorothiazide (HYDRODIURIL) 25 MG tablet   isosorbide mononitrate (IMDUR) 60 MG 24 hr tablet   losartan (COZAAR) 50 MG tablet   metoprolol succinate (TOPROL XL) 25 MG 24 hr tablet   PARoxetine (PAXIL) 30 MG tablet   traZODone (DESYREL) 50 MG tablet   Senile purpura (HCC)    Reassured patient. Continue to monitor.       Relevant Medications   fenofibrate (TRICOR) 145 MG tablet   hydrochlorothiazide (HYDRODIURIL) 25 MG tablet   isosorbide mononitrate (IMDUR) 60 MG 24 hr tablet   losartan (COZAAR) 50 MG tablet   metoprolol succinate (TOPROL XL) 25 MG 24 hr tablet   Aortic atherosclerosis (HCC)    Will keep BP and cholesterol under good control. Continue to monitor. Continue to follow with cardiology. Call with any concerns.       Relevant Medications   fenofibrate (TRICOR) 145 MG tablet   hydrochlorothiazide (HYDRODIURIL) 25 MG tablet   isosorbide mononitrate (IMDUR) 60 MG 24 hr tablet   losartan (COZAAR) 50 MG tablet   metoprolol succinate (TOPROL XL) 25 MG 24 hr tablet     Endocrine   Type 2 diabetes mellitus with complication, without long-term current use of insulin (HCC)    Doing well with A1c of 6.2. Continue diet and exercise. Call with any concerns. Continue to monitor.       Relevant Medications   losartan (COZAAR) 50 MG tablet   metFORMIN (GLUCOPHAGE) 500 MG tablet   Other Relevant  Orders   CBC with Differential/Platelet   Comprehensive metabolic panel   TSH   Bayer DCA Hb A1c Waived (Completed)     Musculoskeletal and Integument   Statin myopathy    Unable to tolerate statins. Continue fenofibrate. Continue to monitor. Call with any concerns.       Relevant Orders   CBC with Differential/Platelet   Comprehensive metabolic panel     Other   Hyperlipidemia    Under good control on current regimen. Continue current regimen. Continue to monitor. Call with any concerns. Refills given. Labs drawn today.        Relevant Medications   fenofibrate (TRICOR) 145 MG tablet   hydrochlorothiazide (HYDRODIURIL) 25 MG tablet   isosorbide mononitrate (IMDUR) 60 MG 24 hr tablet   losartan (COZAAR) 50 MG tablet   metoprolol succinate (TOPROL XL) 25 MG 24 hr tablet   Other Relevant Orders   CBC with Differential/Platelet   Comprehensive metabolic panel   Lipid Panel w/o Chol/HDL Ratio   Moderate episode of recurrent major depressive disorder (HCC)    Under good control on current regimen. Continue current regimen. Continue to monitor. Call with any concerns. Refills given. Labs drawn today.       Relevant Medications   PARoxetine (PAXIL) 30 MG tablet   traZODone (DESYREL) 50 MG tablet   Vitamin D deficiency    Rechecking labs today. Await results. Treat as needed.       Relevant Orders   CBC with Differential/Platelet   Comprehensive metabolic panel   VITAMIN D 25 Hydroxy (Vit-D Deficiency, Fractures)   Other Visit Diagnoses     Routine general medical examination at a health care facility    -  Primary   Vaccines up to date. Screening labs checked today. DEXA ordered. Continue  diet and exercise. Call with any concerns.   Bronchitis       Will treat with prednisone. Call with any concerns. Continue to monitor.   Relevant Medications   triamcinolone acetonide (KENALOG-40) injection 40 mg (Completed)   Deficiency of alpha-galactosidase       Checking labs.  Await results.   Relevant Orders   Alpha-Gal Panel   Screening for osteoporosis       DEXA ordered today.   Relevant Orders   DG Bone Density        Follow up plan: Return in about 6 months (around 05/20/2023).   LABORATORY TESTING:  - Pap smear: not applicable  IMMUNIZATIONS:   - Tdap: Tetanus vaccination status reviewed: last tetanus booster within 10 years. - Influenza: Postponed to flu season - Pneumovax: Up to date - Prevnar: Up to date - COVID: Up to date - HPV: Not applicable - Shingrix vaccine: Refused  SCREENING: -Mammogram: Not applicable  - Colonoscopy: Not applicable  - Bone Density: Ordered today   PATIENT COUNSELING:   Advised to take 1 mg of folate supplement per day if capable of pregnancy.   Sexuality: Discussed sexually transmitted diseases, partner selection, use of condoms, avoidance of unintended pregnancy  and contraceptive alternatives.   Advised to avoid cigarette smoking.  I discussed with the patient that most people either abstain from alcohol or drink within safe limits (<=14/week and <=4 drinks/occasion for males, <=7/weeks and <= 3 drinks/occasion for females) and that the risk for alcohol disorders and other health effects rises proportionally with the number of drinks per week and how often a drinker exceeds daily limits.  Discussed cessation/primary prevention of drug use and availability of treatment for abuse.   Diet: Encouraged to adjust caloric intake to maintain  or achieve ideal body weight, to reduce intake of dietary saturated fat and total fat, to limit sodium intake by avoiding high sodium foods and not adding table salt, and to maintain adequate dietary potassium and calcium preferably from fresh fruits, vegetables, and low-fat dairy products.    stressed the importance of regular exercise  Injury prevention: Discussed safety belts, safety helmets, smoke detector, smoking near bedding or upholstery.   Dental health:  Discussed importance of regular tooth brushing, flossing, and dental visits.    NEXT PREVENTATIVE PHYSICAL DUE IN 1 YEAR. Return in about 6 months (around 05/20/2023).

## 2022-11-20 LAB — CBC WITH DIFFERENTIAL/PLATELET
Basophils Absolute: 0 10*3/uL (ref 0.0–0.2)
Basos: 1 %
EOS (ABSOLUTE): 0.2 10*3/uL (ref 0.0–0.4)
Eos: 5 %
Hematocrit: 36.7 % (ref 34.0–46.6)
Hemoglobin: 11.8 g/dL (ref 11.1–15.9)
Immature Grans (Abs): 0 10*3/uL (ref 0.0–0.1)
Immature Granulocytes: 0 %
Lymphocytes Absolute: 1 10*3/uL (ref 0.7–3.1)
Lymphs: 23 %
MCH: 27 pg (ref 26.6–33.0)
MCHC: 32.2 g/dL (ref 31.5–35.7)
MCV: 84 fL (ref 79–97)
Monocytes Absolute: 0.8 10*3/uL (ref 0.1–0.9)
Monocytes: 19 %
Neutrophils Absolute: 2.4 10*3/uL (ref 1.4–7.0)
Neutrophils: 52 %
RBC: 4.37 x10E6/uL (ref 3.77–5.28)
RDW: 13 % (ref 11.7–15.4)
WBC: 4.5 10*3/uL (ref 3.4–10.8)

## 2022-11-20 LAB — LIPID PANEL W/O CHOL/HDL RATIO
Cholesterol, Total: 186 mg/dL (ref 100–199)
HDL: 58 mg/dL (ref >39)
LDL Chol Calc (NIH): 107 mg/dL — ABNORMAL HIGH (ref 0–99)
Triglycerides: 118 mg/dL (ref 0–149)
VLDL Cholesterol Cal: 21 mg/dL (ref 5–40)

## 2022-11-20 LAB — COMPREHENSIVE METABOLIC PANEL
ALT: 8 IU/L (ref 0–32)
AST: 19 IU/L (ref 0–40)
Albumin: 4.1 g/dL (ref 3.7–4.7)
Alkaline Phosphatase: 38 IU/L — ABNORMAL LOW (ref 44–121)
BUN/Creatinine Ratio: 16 (ref 12–28)
BUN: 14 mg/dL (ref 8–27)
Bilirubin Total: 0.3 mg/dL (ref 0.0–1.2)
CO2: 26 mmol/L (ref 20–29)
Calcium: 9.6 mg/dL (ref 8.7–10.3)
Chloride: 89 mmol/L — ABNORMAL LOW (ref 96–106)
Creatinine, Ser: 0.88 mg/dL (ref 0.57–1.00)
Globulin, Total: 2.2 g/dL (ref 1.5–4.5)
Glucose: 108 mg/dL — ABNORMAL HIGH (ref 70–99)
Potassium: 4.3 mmol/L (ref 3.5–5.2)
Sodium: 130 mmol/L — ABNORMAL LOW (ref 134–144)
Total Protein: 6.3 g/dL (ref 6.0–8.5)
eGFR: 66 mL/min/{1.73_m2} (ref >59)

## 2022-11-20 LAB — VITAMIN D 25 HYDROXY (VIT D DEFICIENCY, FRACTURES): Vit D, 25-Hydroxy: 48.5 ng/mL (ref 30.0–100.0)

## 2022-11-20 LAB — TSH: TSH: 2.31 u[IU]/mL (ref 0.450–4.500)

## 2022-11-21 LAB — ALPHA-GAL PANEL
Allergen Lamb IgE: 1.66 kU/L — AB
Beef IgE: 2.93 kU/L — AB
IgE (Immunoglobulin E), Serum: 279 IU/mL (ref 6–495)
O215-IgE Alpha-Gal: 29.7 kU/L — AB
Pork IgE: 0.85 kU/L — AB

## 2022-12-23 DIAGNOSIS — H353131 Nonexudative age-related macular degeneration, bilateral, early dry stage: Secondary | ICD-10-CM | POA: Diagnosis not present

## 2022-12-23 LAB — HM DIABETES EYE EXAM

## 2022-12-24 ENCOUNTER — Encounter: Payer: Self-pay | Admitting: Family Medicine

## 2022-12-29 ENCOUNTER — Other Ambulatory Visit: Payer: Medicare HMO

## 2023-02-04 ENCOUNTER — Other Ambulatory Visit: Payer: Self-pay | Admitting: Family Medicine

## 2023-02-04 NOTE — Telephone Encounter (Signed)
Medication Refill - Medication: isosorbide mononitrate (IMDUR) 60 MG 24 hr tablet [[id number]]   Has the patient contacted their pharmacy? Yes.    (Agent: If yes, when and what did the pharmacy advise?) Contact office   Preferred Pharmacy (with phone number or street name): [id number] - [place] ([place]), [place] - [address]   Has the patient been seen for an appointment in the last year OR does the patient have an upcoming appointment? Yes.    Agent: Please be advised that RX refills may take up to 3 business days. We ask that you follow-up with your pharmacy.

## 2023-02-05 NOTE — Telephone Encounter (Signed)
Request is too soon, last refill 11/18/22 for 90 and 1 refill.E-Prescribing Status: Receipt confirmed by pharmacy (11/18/2022 12:00 PM EDT)   Requested Prescriptions  Pending Prescriptions Disp Refills   isosorbide mononitrate (IMDUR) 60 MG 24 hr tablet 90 tablet 1    Sig: Take 1 tablet (60 mg total) by mouth daily.     Cardiovascular:  Nitrates Passed - 02/04/2023  3:42 PM      Passed - Last BP in normal range    BP Readings from Last 1 Encounters:  11/18/22 120/69         Passed - Last Heart Rate in normal range    Pulse Readings from Last 1 Encounters:  11/18/22 66         Passed - Valid encounter within last 12 months    Recent Outpatient Visits           2 months ago Routine general medical examination at a health care facility   [facility], [place] P, DO   9 months ago Type 2 diabetes mellitus with complication, without long-term current use of insulin (HCC)   [facility] [place], [place] P, DO   1 year ago Routine general medical examination at a health care facility   [facility] [place], [place] P, DO   1 year ago Type 2 diabetes mellitus with complication, without long-term current use of insulin [facility])   [facility] [place], [place], DO   2 years ago Essential hypertension   [facility] [place], Oralia Rud, DO       Future Appointments             In 3 months Laural Benes, Oralia Rud, DO [place] [facility]

## 2023-02-24 ENCOUNTER — Ambulatory Visit
Admission: RE | Admit: 2023-02-24 | Discharge: 2023-02-24 | Disposition: A | Discharge status: Home / Self Care | Payer: Medicare HMO | Source: Ambulatory Visit | Attending: Family Medicine | Admitting: Family Medicine

## 2023-02-24 DIAGNOSIS — Z7984 Long term (current) use of oral hypoglycemic drugs: Secondary | ICD-10-CM | POA: Insufficient documentation

## 2023-02-24 DIAGNOSIS — M85851 Other specified disorders of bone density and structure, right thigh: Secondary | ICD-10-CM | POA: Insufficient documentation

## 2023-02-24 DIAGNOSIS — Z87891 Personal history of nicotine dependence: Secondary | ICD-10-CM | POA: Insufficient documentation

## 2023-02-24 DIAGNOSIS — E119 Type 2 diabetes mellitus without complications: Secondary | ICD-10-CM | POA: Diagnosis not present

## 2023-02-24 DIAGNOSIS — Z780 Asymptomatic menopausal state: Secondary | ICD-10-CM | POA: Insufficient documentation

## 2023-02-24 DIAGNOSIS — Z13820 Encounter for screening for osteoporosis: Secondary | ICD-10-CM | POA: Diagnosis not present

## 2023-03-01 DIAGNOSIS — R079 Chest pain, unspecified: Secondary | ICD-10-CM | POA: Diagnosis not present

## 2023-03-01 DIAGNOSIS — R0602 Shortness of breath: Secondary | ICD-10-CM | POA: Diagnosis not present

## 2023-03-01 DIAGNOSIS — I25118 Atherosclerotic heart disease of native coronary artery with other forms of angina pectoris: Secondary | ICD-10-CM | POA: Diagnosis not present

## 2023-03-01 DIAGNOSIS — Z955 Presence of coronary angioplasty implant and graft: Secondary | ICD-10-CM | POA: Diagnosis not present

## 2023-03-01 DIAGNOSIS — E118 Type 2 diabetes mellitus with unspecified complications: Secondary | ICD-10-CM | POA: Diagnosis not present

## 2023-03-01 DIAGNOSIS — I6529 Occlusion and stenosis of unspecified carotid artery: Secondary | ICD-10-CM | POA: Diagnosis not present

## 2023-03-01 DIAGNOSIS — E782 Mixed hyperlipidemia: Secondary | ICD-10-CM | POA: Diagnosis not present

## 2023-03-01 DIAGNOSIS — I700 Atherosclerosis of aorta: Secondary | ICD-10-CM | POA: Diagnosis not present

## 2023-03-01 DIAGNOSIS — I10 Essential (primary) hypertension: Secondary | ICD-10-CM | POA: Diagnosis not present

## 2023-03-01 DIAGNOSIS — I2510 Atherosclerotic heart disease of native coronary artery without angina pectoris: Secondary | ICD-10-CM | POA: Diagnosis not present

## 2023-04-12 ENCOUNTER — Encounter: Payer: Self-pay | Admitting: Family Medicine

## 2023-05-20 ENCOUNTER — Ambulatory Visit: Payer: Medicare HMO | Admitting: Family Medicine

## 2023-05-21 ENCOUNTER — Ambulatory Visit (INDEPENDENT_AMBULATORY_CARE_PROVIDER_SITE_OTHER): Payer: Medicare HMO | Admitting: Family Medicine

## 2023-05-21 ENCOUNTER — Telehealth: Payer: Self-pay | Admitting: *Deleted

## 2023-05-21 ENCOUNTER — Encounter: Payer: Self-pay | Admitting: Family Medicine

## 2023-05-21 VITALS — BP 168/66 | HR 63 | Temp 97.6°F | Ht 62.21 in | Wt 165.2 lb

## 2023-05-21 DIAGNOSIS — E785 Hyperlipidemia, unspecified: Secondary | ICD-10-CM | POA: Diagnosis not present

## 2023-05-21 DIAGNOSIS — Z23 Encounter for immunization: Secondary | ICD-10-CM

## 2023-05-21 DIAGNOSIS — E782 Mixed hyperlipidemia: Secondary | ICD-10-CM | POA: Diagnosis not present

## 2023-05-21 DIAGNOSIS — E559 Vitamin D deficiency, unspecified: Secondary | ICD-10-CM | POA: Diagnosis not present

## 2023-05-21 DIAGNOSIS — F331 Major depressive disorder, recurrent, moderate: Secondary | ICD-10-CM

## 2023-05-21 DIAGNOSIS — Z0000 Encounter for general adult medical examination without abnormal findings: Secondary | ICD-10-CM

## 2023-05-21 DIAGNOSIS — H9193 Unspecified hearing loss, bilateral: Secondary | ICD-10-CM

## 2023-05-21 DIAGNOSIS — I10 Essential (primary) hypertension: Secondary | ICD-10-CM

## 2023-05-21 DIAGNOSIS — Z7984 Long term (current) use of oral hypoglycemic drugs: Secondary | ICD-10-CM

## 2023-05-21 DIAGNOSIS — E118 Type 2 diabetes mellitus with unspecified complications: Secondary | ICD-10-CM

## 2023-05-21 LAB — BAYER DCA HB A1C WAIVED: HB A1C (BAYER DCA - WAIVED): 5.8 % — ABNORMAL HIGH (ref 4.8–5.6)

## 2023-05-21 MED ORDER — METFORMIN HCL 500 MG PO TABS: ORAL_TABLET | ORAL | 1 refills | Status: DC

## 2023-05-21 MED ORDER — BUPROPION HCL ER (SR) 150 MG PO TB12: ORAL_TABLET | ORAL | 3 refills | Status: DC

## 2023-05-21 MED ORDER — HYDROCHLOROTHIAZIDE 25 MG PO TABS: 25.0000 mg | ORAL_TABLET | Freq: Every day | ORAL | 1 refills | Status: DC

## 2023-05-21 MED ORDER — PAROXETINE HCL 30 MG PO TABS: 60.0000 mg | ORAL_TABLET | Freq: Every day | ORAL | 0 refills | Status: DC

## 2023-05-21 MED ORDER — OMEPRAZOLE 40 MG PO CPDR: 40.0000 mg | DELAYED_RELEASE_CAPSULE | Freq: Every day | ORAL | 1 refills | Status: DC

## 2023-05-21 MED ORDER — ISOSORBIDE MONONITRATE ER 60 MG PO TB24: 60.0000 mg | ORAL_TABLET | Freq: Every day | ORAL | 1 refills | Status: DC

## 2023-05-21 MED ORDER — FENOFIBRATE 145 MG PO TABS: 145.0000 mg | ORAL_TABLET | Freq: Every day | ORAL | 1 refills | Status: DC

## 2023-05-21 MED ORDER — TRAZODONE HCL 50 MG PO TABS: 50.0000 mg | ORAL_TABLET | Freq: Every day | ORAL | 1 refills | Status: DC

## 2023-05-21 MED ORDER — METOPROLOL SUCCINATE ER 25 MG PO TB24: 25.0000 mg | ORAL_TABLET | Freq: Every day | ORAL | 1 refills | Status: DC

## 2023-05-21 MED ORDER — LOSARTAN POTASSIUM 50 MG PO TABS: 50.0000 mg | ORAL_TABLET | Freq: Every day | ORAL | 1 refills | Status: DC

## 2023-05-21 NOTE — Assessment & Plan Note (Signed)
Rechecking labs today. Await results. Treat as needed.  °

## 2023-05-21 NOTE — Assessment & Plan Note (Signed)
Not under good control. Unclear if paxil is working. Will continue paxil and start wellbutrin. Recheck 6 weeks. Call with any concerns.

## 2023-05-21 NOTE — Assessment & Plan Note (Signed)
Doing well with A1c of 5.8 down from 6.2. Will cut her metformin down to 3 pills a day and recheck in 3 months. Call with any concerns.

## 2023-05-21 NOTE — Assessment & Plan Note (Signed)
Running high here, but better at home. Will see if we can get mood under better control and recheck her BP in 6 weeks- if still running high next visit will increase her losartan.

## 2023-05-21 NOTE — Patient Instructions (Signed)
Preventative Services:  Health Risk Assessment and Personalized Prevention Plan: Done today Bone Mass Measurements: up to date Breast Cancer Screening: N/A CVD Screening: up to date Cervical Cancer Screening: N/A Colon Cancer Screening: N/A Depression Screening: Done today Diabetes Screening: Done today Glaucoma Screening: see your eye doctor Hepatitis B vaccine: N/A Hepatitis C screening: up to date HIV Screening: up to date Flu Vaccine: given today Lung cancer Screening: N/A Obesity Screening: done today Pneumonia Vaccines (2): up to date STI Screening: N/A

## 2023-05-21 NOTE — Assessment & Plan Note (Signed)
Unable to tolerate statins. Rechecking labs today. Await results.

## 2023-05-21 NOTE — Telephone Encounter (Signed)
Pt requesting  lab results that was taken today . No results noted at this time. Patient requesting what PCP told her today regarding Hbg A1c and  per  MD notes  today reviewed  on 05/21/23.  Endocrine     Type 2 diabetes mellitus with complication, without long-term current use of insulin (HCC)    Doing well with A1c of 5.8 down from 6.2. Will cut her metformin down to 3 pills a day and recheck in 3 months. Call with any concerns.        Pt verbalized understanding.

## 2023-05-21 NOTE — Progress Notes (Signed)
BP (!) 168/66   Pulse 63   Temp 97.6 F (36.4 C) (Oral)   Ht 5' 2.21" (1.58 m)   Wt 165 lb 3.2 oz (74.9 kg)   SpO2 96%   BMI 30.01 kg/m    Subjective:    Patient ID: Susan Allen, female    DOB: 21-Jun-1940, 82 y.o.   MRN: [id number]  HPI: Susan Allen is a 82 y.o. female presenting on 05/21/2023 for comprehensive medical examination. Current medical complaints include:  DIABETES Hypoglycemic episodes:no Polydipsia/polyuria: no Visual disturbance: no Chest pain: no Paresthesias: no Glucose Monitoring: no  Accucheck frequency: Not Checking Taking Insulin?: no Blood Pressure Monitoring: a few times a month Retinal Examination: Up to Date Foot Exam: Up to Date Diabetic Education: Completed Pneumovax: Up to Date Influenza:  Given today Aspirin: no  HYPERTENSION / HYPERLIPIDEMIA Satisfied with current treatment? yes Duration of hypertension: chronic BP monitoring frequency: not checking BP range: 130s/80s BP medication side effects: no Past BP meds: losartan, imdur, hydrochlorothiazide, metoprolol Duration of hyperlipidemia: chronic Cholesterol medication side effects: yes Cholesterol supplements: none Past cholesterol medications: fenofibrate Medication compliance: good compliance Aspirin: no Recent stressors: yes Recurrent headaches: no Visual changes: no Palpitations: no Dyspnea: no Chest pain: no Lower extremity edema: no Dizzy/lightheaded: no  DEPRESSION Mood status: uncontrolled Satisfied with current treatment?: no Symptom severity: severe  Duration of current treatment : chronic Side effects: no Medication compliance: excellent compliance Psychotherapy/counseling: no  Previous psychiatric medications: paxil Depressed mood: yes Anxious mood: yes Anhedonia: no Significant weight loss or gain: no Insomnia: no  Fatigue: yes Feelings of worthlessness or guilt: yes Impaired concentration/indecisiveness: no Suicidal ideations: no Hopelessness:  yes Crying spells: no    05/21/2023   10:32 AM 11/18/2022   11:17 AM 05/06/2022    9:39 AM 02/10/2022   10:48 AM 11/04/2021   10:39 AM  Depression screen PHQ 2/9  Decreased Interest 3 2 2 2 3   Down, Depressed, Hopeless 3 2 2 1  0  PHQ - 2 Score 6 4 4 3 3   Altered sleeping 0 1 0 0 0  Tired, decreased energy 3 2 0 2 1  Change in appetite 3 2 0 0 0  Feeling bad or failure about yourself  3 2 0 0 0  Trouble concentrating 3 0 0 0 0  Moving slowly or fidgety/restless 3 0 0 0 0  Suicidal thoughts  0 0 0 0  PHQ-9 Score 21 11 4 5 4   Difficult doing work/chores  Somewhat difficult Not difficult at all Not difficult at all Not difficult at all    She currently lives with: husband Menopausal Symptoms: no  Functional Status Survey: Is the patient deaf or have difficulty hearing?: Yes Does the patient have difficulty seeing, even when wearing glasses/contacts?: No Does the patient have difficulty concentrating, remembering, or making decisions?: Yes Does the patient have difficulty walking or climbing stairs?: Yes Does the patient have difficulty dressing or bathing?: No Does the patient have difficulty doing errands alone such as visiting a doctor's office or shopping?: No     05/21/2023   11:08 AM 11/18/2022   11:17 AM 05/06/2022    9:39 AM 02/10/2022   10:37 AM 11/04/2021   10:39 AM  Fall Risk   Falls in the past year? 1 0 0 1 0  Number falls in past yr: 0 0 0 0 0  Injury with Fall? 0 0 0 1 0  Risk for fall due to : History of fall(s)  No Fall Risks No Fall Risks  No Fall Risks  Follow up Education provided Falls evaluation completed Falls evaluation completed Falls evaluation completed;Education provided;Falls prevention discussed Falls evaluation completed    Depression Screen    05/21/2023   10:32 AM 11/18/2022   11:17 AM 05/06/2022    9:39 AM 02/10/2022   10:48 AM 11/04/2021   10:39 AM  Depression screen PHQ 2/9  Decreased Interest 3 2 2 2 3   Down, Depressed, Hopeless 3 2 2  1  0  PHQ - 2 Score 6 4 4 3 3   Altered sleeping 0 1 0 0 0  Tired, decreased energy 3 2 0 2 1  Change in appetite 3 2 0 0 0  Feeling bad or failure about yourself  3 2 0 0 0  Trouble concentrating 3 0 0 0 0  Moving slowly or fidgety/restless 3 0 0 0 0  Suicidal thoughts  0 0 0 0  PHQ-9 Score 21 11 4 5 4   Difficult doing work/chores  Somewhat difficult Not difficult at all Not difficult at all Not difficult at all    Advanced Directives Does patient have a HCPOA?    yes If yes, name and contact information: Daughter Does patient have a living will or MOST form?  no  Past Medical History:  Past Medical History:  Diagnosis Date   Allergy    Arthritis    Bronchitis    Coronary artery disease    Depression    Diabetes mellitus without complication (HCC)    GERD (gastroesophageal reflux disease)    Headache    Hypertension    Myocardial infarction [facility]) June 2014, July 2014    Surgical History:  Past Surgical History:  Procedure Laterality Date   APPENDECTOMY     CATARACT EXTRACTION W/PHACO Right 04/22/2021   Procedure: CATARACT EXTRACTION PHACO AND INTRAOCULAR LENS PLACEMENT (IOC) RIGHT DIABETIC 23.38 02:10.8;  Surgeon: Galen Manila, MD;  Location: [facility];  Service: Ophthalmology;  Laterality: Right;  Diabetic   CATARACT EXTRACTION W/PHACO Left 06/17/2021   Procedure: CATARACT EXTRACTION PHACO AND INTRAOCULAR LENS PLACEMENT (IOC) LEFT DIABETIC 6.39 00:38.2;  Surgeon: Galen Manila, MD;  Location: [facility];  Service: Ophthalmology;  Laterality: Left;   CORONARY ANGIOPLASTY     with stents    DIGIT NAIL REMOVAL Bilateral 08/30/2015   Procedure: REMOVAL OF DIGIT NAILS/ BIL. PERM.REMOVAL INGROWN GREAT TOE NAILS;  Surgeon: Recardo Evangelist, DPM;  Location: [facility] OR[facility];  Service: Podiatry;  Laterality: Bilateral;   EXCISION MORTON'S NEUROMA Right 08/30/2015   Procedure: EXCISION MORTON'S NEUROMA;  Surgeon: Recardo Evangelist, DPM;  Location: [facility];   Service: Podiatry;  Laterality: Right;   JOINT REPLACEMENT Bilateral    Total Knee Replacement, Dr Jinny Sanders, [facility]   LEFT HEART CATH AND CORONARY ANGIOGRAPHY N/A 02/06/2021   Procedure: LEFT HEART CATH AND CORONARY ANGIOGRAPHY;  Surgeon: Armando Reichert, MD;  Location: [facility] IN[facility];  Service: Cardiovascular;  Laterality: N/A;   TONSILLECTOMY     TOTAL ABDOMINAL HYSTERECTOMY      Medications:  Current Outpatient Medications on File Prior to Visit  Medication Sig   Blood Glucose Calibration (OT ULTRA/FASTTK CNTRL SOLN) SOLN See admin instructions.   Blood Glucose Monitoring Suppl (FREESTYLE LITE) DEVI USE TO CHECK GLUCOSE ONCE DAILY   cetirizine (ZYRTEC) 10 MG tablet Take 10 mg by mouth daily.   clopidogrel (PLAVIX) 75 MG tablet Take 1 tablet (75 mg total) by mouth daily.   diclofenac  Sodium (VOLTAREN) 1 % GEL APPLY 4 GRAMS TOPICALLY 4 TIMES DAILY (Patient taking differently: Apply 4 g topically 4 (four) times daily as needed (pain).)   diphenhydrAMINE (BENADRYL ALLERGY) 25 MG tablet Take 25 mg by mouth every 6 (six) hours as needed.   Easy Comfort Lancets MISC 3 (three) times daily. for testing   EPINEPHrine 0.3 mg/0.3 mL IJ SOAJ injection See admin instructions.   FREESTYLE LITE test strip SMARTSIG:Strip(s) Via Meter Daily   Lancet Devices (EASY MINI EJECT LANCING DEVICE) MISC See admin instructions.   nitroGLYCERIN (NITROSTAT) 0.4 MG SL tablet Place 1 tablet (0.4 mg total) under the tongue every 5 (five) minutes as needed for chest pain.   Simethicone (GAS-X PO) Take 1 tablet by mouth daily as needed (flatulence).   triamcinolone cream (KENALOG) 0.1 % Apply 1 application  topically daily as needed (irritation).   No current facility-administered medications on file prior to visit.    Allergies:  Allergies  Allergen Reactions   Alpha-Gal Hives    Red meat   Aspirin Hives and Other (See Comments)    "fainting"   Lisinopril Cough   Statins  Other (See Comments)    Leg cramps    Social History:  Social History   Socioeconomic History   Marital status: Married    Spouse name: Not on file   Number of children: Not on file   Years of education: Not on file   Highest education level: Not on file  Occupational History   Occupation: retired  Tobacco Use   Smoking status: Former    Current packs/day: 0.00    Types: Cigarettes    Quit date: 02/27/2004    Years since quitting: 19.2   Smokeless tobacco: Never  Vaping Use   Vaping status: Former  Substance and Sexual Activity   Alcohol use: No   Drug use: No   Sexual activity: Not Currently  Other Topics Concern   Not on file  Social History Narrative   Not on file   Social Drivers of Health   Financial Resource Strain: Low Risk  (02/10/2022)   Overall Financial Resource Strain (CARDIA)    Difficulty of Paying Living Expenses: Not hard at all  Food Insecurity: No Food Insecurity (05/21/2023)   Hunger Vital Sign    Worried About Running Out of Food in the Last Year: Never true    Ran Out of Food in the Last Year: Never true  Transportation Needs: No Transportation Needs (05/21/2023)   PRAPARE - Administrator, Civil Service (Medical): No    Lack of Transportation (Non-Medical): No  Physical Activity: Insufficiently Active (02/10/2022)   Exercise Vital Sign    Days of Exercise per Week: 3 days    Minutes of Exercise per Session: 40 min  Stress: No Stress Concern Present (02/10/2022)   [employer] of Occupational Health - Occupational Stress Questionnaire    Feeling of Stress : Not at all  Social Connections: Socially Integrated (02/10/2022)   Social Connection and Isolation Panel [NHANES]    Frequency of Communication with Friends and Family: More than three times a week    Frequency of Social Gatherings with Friends and Family: Three times a week    Attends Religious Services: More than 4 times per year    Active Member of Clubs or Organizations:  Yes    Attends Banker Meetings: More than 4 times per year    Marital Status: Married  Catering manager Violence: Not At  Risk (05/21/2023)   Humiliation, Afraid, Rape, and Kick questionnaire    Fear of Current or Ex-Partner: No    Emotionally Abused: No    Physically Abused: No    Sexually Abused: No   Social History   Tobacco Use  Smoking Status Former   Current packs/day: 0.00   Types: Cigarettes   Quit date: 02/27/2004   Years since quitting: 19.2  Smokeless Tobacco Never   Social History   Substance and Sexual Activity  Alcohol Use No    Family History:  Family History  Problem Relation Age of Onset   Colon cancer Mother 76   Cancer Mother        colon   Cancer Father        lung   Cancer Sister        ovarian   Breast cancer Neg Hx     Past medical history, surgical history, medications, allergies, family history and social history reviewed with patient today and changes made to appropriate areas of the chart.   Review of Systems  Constitutional: Negative.   Respiratory: Negative.    Cardiovascular: Negative.   Genitourinary: Negative.   Musculoskeletal: Negative.   Psychiatric/Behavioral:  Positive for depression. Negative for hallucinations, memory loss, substance abuse and suicidal ideas. The patient is not nervous/anxious and does not have insomnia.     All other ROS negative except what is listed above and in the HPI.      Objective:    BP (!) 168/66   Pulse 63   Temp 97.6 F (36.4 C) (Oral)   Ht 5' 2.21" (1.58 m)   Wt 165 lb 3.2 oz (74.9 kg)   SpO2 96%   BMI 30.01 kg/m   Wt Readings from Last 3 Encounters:  05/21/23 165 lb 3.2 oz (74.9 kg)  11/18/22 164 lb 9.6 oz (74.7 kg)  05/06/22 169 lb 4.8 oz (76.8 kg)     Physical Exam Vitals and nursing note reviewed.  Constitutional:      General: She is not in acute distress.    Appearance: Normal appearance. She is not ill-appearing, toxic-appearing or diaphoretic.  HENT:      Head: Normocephalic and atraumatic.     Right Ear: External ear normal.     Left Ear: External ear normal.     Nose: Nose normal.     Mouth/Throat:     Mouth: Mucous membranes are moist.     Pharynx: Oropharynx is clear.  Eyes:     General: No scleral icterus.       Right eye: No discharge.        Left eye: No discharge.     Extraocular Movements: Extraocular movements intact.     Conjunctiva/sclera: Conjunctivae normal.     Pupils: Pupils are equal, round, and reactive to light.  Cardiovascular:     Rate and Rhythm: Normal rate and regular rhythm.     Pulses: Normal pulses.     Heart sounds: Normal heart sounds. No murmur heard.    No friction rub. No gallop.  Pulmonary:     Effort: Pulmonary effort is normal. No respiratory distress.     Breath sounds: Normal breath sounds. No stridor. No wheezing, rhonchi or rales.  Chest:     Chest wall: No tenderness.  Musculoskeletal:        General: Normal range of motion.     Cervical back: Normal range of motion and neck supple.  Skin:    General: Skin is  warm and dry.     Capillary Refill: Capillary refill takes less than 2 seconds.     Coloration: Skin is not jaundiced or pale.     Findings: No bruising, erythema, lesion or rash.  Neurological:     General: No focal deficit present.     Mental Status: She is alert and oriented to person, place, and time. Mental status is at baseline.  Psychiatric:        Mood and Affect: Mood normal.        Behavior: Behavior normal.        Thought Content: Thought content normal.        Judgment: Judgment normal.        05/21/2023   11:10 AM 02/10/2022   10:39 AM 02/03/2021   10:39 AM 02/02/2020   10:38 AM 01/20/2019    8:52 AM  6CIT Screen  What Year? 0 points  0 points 0 points 0 points  What month? 0 points  0 points 0 points 0 points  What time? 3 points 0 points 0 points 0 points 0 points  Count back from 20 0 points 0 points 0 points 0 points 0 points  Months in reverse 0 points 0  points 0 points 0 points 0 points  Repeat phrase 0 points 0 points 2 points 0 points 2 points  Total Score 3 points  2 points 0 points 2 points    Results for orders placed or performed in visit on 12/24/22  HM DIABETES EYE EXAM   Collection Time: 12/23/22 12:00 AM  Result Value Ref Range   HM Diabetic Eye Exam No Retinopathy No Retinopathy      Assessment & Plan:   Problem List Items Addressed This Visit       Cardiovascular and Mediastinum   Essential hypertension   Running high here, but better at home. Will see if we can get mood under better control and recheck her BP in 6 weeks- if still running high next visit will increase her losartan.       Relevant Medications   fenofibrate (TRICOR) 145 MG tablet   hydrochlorothiazide (HYDRODIURIL) 25 MG tablet   isosorbide mononitrate (IMDUR) 60 MG 24 hr tablet   losartan (COZAAR) 50 MG tablet   metoprolol succinate (TOPROL XL) 25 MG 24 hr tablet   Other Relevant Orders   CBC with Differential/Platelet   Comprehensive metabolic panel     Endocrine   Type 2 diabetes mellitus with complication, without long-term current use of insulin (HCC)   Doing well with A1c of 5.8 down from 6.2. Will cut her metformin down to 3 pills a day and recheck in 3 months. Call with any concerns.       Relevant Medications   metFORMIN (GLUCOPHAGE) 500 MG tablet   losartan (COZAAR) 50 MG tablet   Other Relevant Orders   Bayer DCA Hb A1c Waived   CBC with Differential/Platelet   Comprehensive metabolic panel     Other   Hyperlipidemia   Unable to tolerate statins. Rechecking labs today. Await results.       Relevant Medications   fenofibrate (TRICOR) 145 MG tablet   hydrochlorothiazide (HYDRODIURIL) 25 MG tablet   isosorbide mononitrate (IMDUR) 60 MG 24 hr tablet   losartan (COZAAR) 50 MG tablet   metoprolol succinate (TOPROL XL) 25 MG 24 hr tablet   Other Relevant Orders   CBC with Differential/Platelet   Comprehensive metabolic panel    Lipid Panel w/o Chol/HDL Ratio  Moderate episode of recurrent major depressive disorder (HCC)   Not under good control. Unclear if paxil is working. Will continue paxil and start wellbutrin. Recheck 6 weeks. Call with any concerns.       Relevant Medications   PARoxetine (PAXIL) 30 MG tablet   traZODone (DESYREL) 50 MG tablet   buPROPion (WELLBUTRIN SR) 150 MG 12 hr tablet   Other Relevant Orders   CBC with Differential/Platelet   Comprehensive metabolic panel   Vitamin D deficiency   Rechecking labs today. Await results. Treat as needed.       Relevant Orders   CBC with Differential/Platelet   Comprehensive metabolic panel   VITAMIN D 25 Hydroxy (Vit-D Deficiency, Fractures)   Other Visit Diagnoses       Encounter for Medicare annual wellness exam    -  Primary   Preventative care discussed today as below.     Bilateral hearing loss, unspecified hearing loss type       Referral to audiology placed today.   Relevant Orders   Ambulatory referral to Audiology     Needs flu shot       Flu shot given today   Relevant Orders   Flu Vaccine Trivalent High Dose (Fluad) (Completed)        Preventative Services:  Health Risk Assessment and Personalized Prevention Plan: Done today Bone Mass Measurements: up to date Breast Cancer Screening: N/A CVD Screening: up to date Cervical Cancer Screening: N/A Colon Cancer Screening: N/A Depression Screening: Done today Diabetes Screening: Done today Glaucoma Screening: see your eye doctor Hepatitis B vaccine: N/A Hepatitis C screening: up to date HIV Screening: up to date Flu Vaccine: given today Lung cancer Screening: N/A Obesity Screening: done today Pneumonia Vaccines (2): up to date STI Screening: N/A  Follow up plan: Return in about 6 weeks (around 07/02/2023).   LABORATORY TESTING:  - Pap smear: not applicable  IMMUNIZATIONS:   - Tdap: Tetanus vaccination status reviewed: last tetanus booster within 10 years. -  Influenza: Administered today - Pneumovax: Up to date - Prevnar: Up to date - Zostavax vaccine: Refused  SCREENING: -Mammogram: Not applicable  - Colonoscopy: Not applicable  - Bone Density: Up to date   PATIENT COUNSELING:   Advised to take 1 mg of folate supplement per day if capable of pregnancy.   Sexuality: Discussed sexually transmitted diseases, partner selection, use of condoms, avoidance of unintended pregnancy  and contraceptive alternatives.   Advised to avoid cigarette smoking.  I discussed with the patient that most people either abstain from alcohol or drink within safe limits (<=14/week and <=4 drinks/occasion for males, <=7/weeks and <= 3 drinks/occasion for females) and that the risk for alcohol disorders and other health effects rises proportionally with the number of drinks per week and how often a drinker exceeds daily limits.  Discussed cessation/primary prevention of drug use and availability of treatment for abuse.   Diet: Encouraged to adjust caloric intake to maintain  or achieve ideal body weight, to reduce intake of dietary saturated fat and total fat, to limit sodium intake by avoiding high sodium foods and not adding table salt, and to maintain adequate dietary potassium and calcium preferably from fresh fruits, vegetables, and low-fat dairy products.    stressed the importance of regular exercise  Injury prevention: Discussed safety belts, safety helmets, smoke detector, smoking near bedding or upholstery.   Dental health: Discussed importance of regular tooth brushing, flossing, and dental visits.    NEXT PREVENTATIVE PHYSICAL DUE  IN 1 YEAR. Return in about 6 weeks (around 07/02/2023).

## 2023-05-22 LAB — LIPID PANEL W/O CHOL/HDL RATIO
Cholesterol, Total: 212 mg/dL — ABNORMAL HIGH (ref 100–199)
HDL: 57 mg/dL (ref >39)
LDL Chol Calc (NIH): 131 mg/dL — ABNORMAL HIGH (ref 0–99)
Triglycerides: 138 mg/dL (ref 0–149)
VLDL Cholesterol Cal: 24 mg/dL (ref 5–40)

## 2023-05-22 LAB — CBC WITH DIFFERENTIAL/PLATELET
Basophils Absolute: 0 10*3/uL (ref 0.0–0.2)
Basos: 1 %
EOS (ABSOLUTE): 0.2 10*3/uL (ref 0.0–0.4)
Eos: 4 %
Hematocrit: 34.3 % (ref 34.0–46.6)
Hemoglobin: 11 g/dL — ABNORMAL LOW (ref 11.1–15.9)
Immature Grans (Abs): 0 10*3/uL (ref 0.0–0.1)
Immature Granulocytes: 1 %
Lymphocytes Absolute: 1.7 10*3/uL (ref 0.7–3.1)
Lymphs: 33 %
MCH: 27.3 pg (ref 26.6–33.0)
MCHC: 32.1 g/dL (ref 31.5–35.7)
MCV: 85 fL (ref 79–97)
Monocytes Absolute: 0.6 10*3/uL (ref 0.1–0.9)
Monocytes: 12 %
Neutrophils Absolute: 2.6 10*3/uL (ref 1.4–7.0)
Neutrophils: 49 %
RBC: 4.03 x10E6/uL (ref 3.77–5.28)
RDW: 13.3 % (ref 11.7–15.4)
WBC: 5.2 10*3/uL (ref 3.4–10.8)

## 2023-05-22 LAB — COMPREHENSIVE METABOLIC PANEL
ALT: 7 [IU]/L (ref 0–32)
AST: 17 [IU]/L (ref 0–40)
Albumin: 4 g/dL (ref 3.7–4.7)
Alkaline Phosphatase: 47 [IU]/L (ref 44–121)
BUN/Creatinine Ratio: 18 (ref 12–28)
BUN: 14 mg/dL (ref 8–27)
Bilirubin Total: 0.3 mg/dL (ref 0.0–1.2)
CO2: 25 mmol/L (ref 20–29)
Calcium: 9 mg/dL (ref 8.7–10.3)
Chloride: 90 mmol/L — ABNORMAL LOW (ref 96–106)
Creatinine, Ser: 0.8 mg/dL (ref 0.57–1.00)
Globulin, Total: 1.9 g/dL (ref 1.5–4.5)
Glucose: 111 mg/dL — ABNORMAL HIGH (ref 70–99)
Potassium: 4.3 mmol/L (ref 3.5–5.2)
Sodium: 131 mmol/L — ABNORMAL LOW (ref 134–144)
Total Protein: 5.9 g/dL — ABNORMAL LOW (ref 6.0–8.5)
eGFR: 74 mL/min/{1.73_m2} (ref >59)

## 2023-05-22 LAB — VITAMIN D 25 HYDROXY (VIT D DEFICIENCY, FRACTURES): Vit D, 25-Hydroxy: 38.9 ng/mL (ref 30.0–100.0)

## 2023-06-26 ENCOUNTER — Other Ambulatory Visit: Payer: Self-pay | Admitting: Family Medicine

## 2023-06-28 NOTE — Telephone Encounter (Signed)
Requested medications are due for refill today.  no  Requested medications are on the active medications list.  yes  Last refill. 05/21/2023 #270 1 rf  Future visit scheduled.   yes  Notes to clinic.  Sig on med list does not match sig on refill request.    Requested Prescriptions  Pending Prescriptions Disp Refills   metFORMIN (GLUCOPHAGE) 500 MG tablet [Pharmacy Med Name: metFORMIN HCl 500 MG Oral Tablet] 360 tablet 0    Sig: TAKE 2 TABLETS BY MOUTH TWICE DAILY WITH A MEAL     Endocrinology:  Diabetes - Biguanides Failed - 06/28/2023 11:12 AM      Failed - B12 Level in normal range and within 720 days    No results found for: "VITAMINB12"       Passed - Cr in normal range and within 360 days    Creatinine  Date Value Ref Range Status  02/19/2013 0.86 0.60 - 1.30 mg/dL Final   Creatinine, Ser  Date Value Ref Range Status  05/21/2023 0.80 0.57 - 1.00 mg/dL Final         Passed - HBA1C is between 0 and 7.9 and within 180 days    Hemoglobin A1C  Date Value Ref Range Status  05/03/2018 7.1  Final   HB A1C (BAYER DCA - WAIVED)  Date Value Ref Range Status  05/21/2023 5.8 (H) 4.8 - 5.6 % Final    Comment:             Prediabetes: 5.7 - 6.4          Diabetes: >6.4          Glycemic control for adults with diabetes: <7.0          Passed - eGFR in normal range and within 360 days    EGFR (African American)  Date Value Ref Range Status  02/19/2013 >60  Final   GFR calc Af Amer  Date Value Ref Range Status  04/23/2020 79 >59 mL/min/1.73 Final    Comment:    **In accordance with recommendations from the NKF-ASN Task force,**   [facility] is in the process of updating its eGFR calculation to the   2021 CKD-EPI creatinine equation that estimates kidney function   without a race variable.    EGFR (Non-African Amer.)  Date Value Ref Range Status  02/19/2013 >60  Final    Comment:    eGFR values <25mL/min/1.73 m2 may be an indication of chronic kidney disease  (CKD). Calculated eGFR is useful in patients with stable renal function. The eGFR calculation will not be reliable in acutely ill patients when serum creatinine is changing rapidly. It is not useful in  patients on dialysis. The eGFR calculation may not be applicable to patients at the low and high extremes of body sizes, pregnant women, and vegetarians.    GFR, Estimated  Date Value Ref Range Status  09/15/2020 >60 >60 mL/min Final    Comment:    (NOTE) Calculated using the CKD-EPI Creatinine Equation (2021)    eGFR  Date Value Ref Range Status  05/21/2023 74 >59 mL/min/1.73 Final         Passed - Valid encounter within last 6 months    Recent Outpatient Visits           1 month ago Encounter for [employer] annual wellness exam   [facility] [place], Megan P, DO   7 months ago Routine general medical examination at a health care facility  [place] [facility] [place], [place] P, DO   1 year ago Type 2 diabetes mellitus with complication, without long-term current use of insulin (HCC)   [facility] [place], [place] P, DO   1 year ago Routine general medical examination at a health care facility   [facility], [place] P, DO   2 years ago Type 2 diabetes mellitus with complication, without long-term current use of insulin (HCC)   [place] [facility] [place], [place], DO       Future Appointments             In 1 week Laural Benes, Oralia Rud, DO [facility]            Passed - CBC within normal limits and completed in the last 12 months    WBC  Date Value Ref Range Status  05/21/2023 5.2 3.4 - 10.8 x10E3/uL Final  09/15/2020 10.0 4.0 - 10.5 K/uL Final   RBC  Date Value Ref Range Status  05/21/2023 4.03 3.77 - 5.28 x10E6/uL Final  09/15/2020 4.46 3.87 - 5.11 MIL/uL Final   Hemoglobin  Date Value Ref Range Status  05/21/2023 11.0  (L) 11.1 - 15.9 g/dL Final   Hematocrit  Date Value Ref Range Status  05/21/2023 34.3 34.0 - 46.6 % Final   MCHC  Date Value Ref Range Status  05/21/2023 32.1 31.5 - 35.7 g/dL Final  60/45/4098 11.9 30.0 - 36.0 g/dL Final   [facility]  Date Value Ref Range Status  05/21/2023 27.3 26.6 - 33.0 pg Final  09/15/2020 28.3 26.0 - 34.0 pg Final   MCV  Date Value Ref Range Status  05/21/2023 85 79 - 97 fL Final  02/19/2013 84 80 - 100 fL Final   No results found for: "PLTCOUNTKUC", "LABPLAT", "POCPLA" RDW  Date Value Ref Range Status  05/21/2023 13.3 11.7 - 15.4 % Final  02/19/2013 13.9 11.5 - 14.5 % Final

## 2023-07-06 ENCOUNTER — Encounter: Payer: Self-pay | Admitting: Family Medicine

## 2023-07-06 ENCOUNTER — Ambulatory Visit: Payer: Medicare HMO | Admitting: Family Medicine

## 2023-07-06 VITALS — BP 138/57 | HR 62 | Temp 98.9°F | Resp 16 | Wt 164.2 lb

## 2023-07-06 DIAGNOSIS — F331 Major depressive disorder, recurrent, moderate: Secondary | ICD-10-CM

## 2023-07-06 DIAGNOSIS — E118 Type 2 diabetes mellitus with unspecified complications: Secondary | ICD-10-CM | POA: Diagnosis not present

## 2023-07-06 DIAGNOSIS — Z7984 Long term (current) use of oral hypoglycemic drugs: Secondary | ICD-10-CM | POA: Diagnosis not present

## 2023-07-06 LAB — BAYER DCA HB A1C WAIVED: HB A1C (BAYER DCA - WAIVED): 5.9 % — ABNORMAL HIGH (ref 4.8–5.6)

## 2023-07-06 MED ORDER — BUPROPION HCL ER (XL) 300 MG PO TB24: 300.0000 mg | ORAL_TABLET | Freq: Every day | ORAL | 1 refills | Status: DC

## 2023-07-06 MED ORDER — METFORMIN HCL 500 MG PO TABS: 500.0000 mg | ORAL_TABLET | Freq: Two times a day (BID) | ORAL | Status: DC

## 2023-07-06 MED ORDER — PAROXETINE HCL 30 MG PO TABS: 60.0000 mg | ORAL_TABLET | Freq: Every day | ORAL | 1 refills | Status: DC

## 2023-07-06 NOTE — Assessment & Plan Note (Signed)
Has been taking both her wellbutrins at the same time. Will change to XR and recheck in 3 months. Doing much better. Continue to monitor.

## 2023-07-06 NOTE — Assessment & Plan Note (Signed)
Did not cut down on her metformin. Will cut back to 500mg  BID and recheck in 3 months. Call with any concerns. Continue to monitor.

## 2023-07-06 NOTE — Patient Instructions (Signed)
Cut your metformin to 1 pill 2x a day

## 2023-07-06 NOTE — Progress Notes (Signed)
BP (!) 138/57 (BP Location: Left Arm, Patient Position: Sitting, Cuff Size: Large)   Pulse 62   Temp 98.9 F (37.2 C) (Oral)   Resp 16   Wt 164 lb 3.2 oz (74.5 kg)   SpO2 97%   BMI 29.83 kg/m    Subjective:    Patient ID: Susan Allen, female    DOB: 1941/01/28, 83 y.o.   MRN: [id number]  HPI: Susan Allen is a 83 y.o. female  Chief Complaint  Patient presents with   Depression    Going well no concerns. No adjustments needed   Diabetes   ANXIETY/DEPRESSION Duration: chronic Status:better Anxious mood: no  Excessive worrying: no Irritability: no  Sweating: no Nausea: no Palpitations:no Hyperventilation: no Panic attacks: no Agoraphobia: no  Obscessions/compulsions: no Depressed mood: no    07/06/2023    8:50 AM 05/21/2023   10:32 AM 11/18/2022   11:17 AM 05/06/2022    9:39 AM 02/10/2022   10:48 AM  Depression screen PHQ 2/9  Decreased Interest 2 3 2 2 2   Down, Depressed, Hopeless 1 3 2 2 1   PHQ - 2 Score 3 6 4 4 3   Altered sleeping 0 0 1 0 0  Tired, decreased energy 3 3 2  0 2  Change in appetite 0 3 2 0 0  Feeling bad or failure about yourself  0 3 2 0 0  Trouble concentrating 0 3 0 0 0  Moving slowly or fidgety/restless 0 3 0 0 0  Suicidal thoughts 0  0 0 0  PHQ-9 Score 6 21 11 4 5   Difficult doing work/chores Not difficult at all  Somewhat difficult Not difficult at all Not difficult at all   Anhedonia: no Weight changes: no Insomnia: no   Hypersomnia: no Fatigue/loss of energy: yes Feelings of worthlessness: no Feelings of guilt: no Impaired concentration/indecisiveness: no Suicidal ideations: no  Crying spells: no Recent Stressors/Life Changes: no   Relationship problems: no   Family stress: no     Financial stress: no    Job stress: no    Recent death/loss: no  DIABETES Hypoglycemic episodes:no Polydipsia/polyuria: no Visual disturbance: no Chest pain: no Paresthesias: no Glucose Monitoring: no  Accucheck frequency:  rarely Taking  Insulin?: no Blood Pressure Monitoring: not checking Retinal Examination: Up to Date Foot Exam: Up to Date Diabetic Education: Completed Pneumovax: Up to Date Influenza: Up to Date Aspirin: no  Relevant past medical, surgical, family and social history reviewed and updated as indicated. Interim medical history since our last visit reviewed. Allergies and medications reviewed and updated.  Review of Systems  Constitutional: Negative.   Respiratory: Negative.    Cardiovascular: Negative.   Musculoskeletal: Negative.   Neurological:  Positive for dizziness (in the middle of the night when she gets up). Negative for tremors, seizures, syncope, facial asymmetry, speech difficulty, weakness, light-headedness, numbness and headaches.  Psychiatric/Behavioral: Negative.      Per HPI unless specifically indicated above     Objective:    BP (!) 138/57 (BP Location: Left Arm, Patient Position: Sitting, Cuff Size: Large)   Pulse 62   Temp 98.9 F (37.2 C) (Oral)   Resp 16   Wt 164 lb 3.2 oz (74.5 kg)   SpO2 97%   BMI 29.83 kg/m   Wt Readings from Last 3 Encounters:  07/06/23 164 lb 3.2 oz (74.5 kg)  05/21/23 165 lb 3.2 oz (74.9 kg)  11/18/22 164 lb 9.6 oz (74.7 kg)    Physical Exam  Vitals and nursing note reviewed.  Constitutional:      General: She is not in acute distress.    Appearance: Normal appearance. She is not ill-appearing, toxic-appearing or diaphoretic.  HENT:     Head: Normocephalic and atraumatic.     Right Ear: External ear normal.     Left Ear: External ear normal.     Nose: Nose normal.     Mouth/Throat:     Mouth: Mucous membranes are moist.     Pharynx: Oropharynx is clear.  Eyes:     General: No scleral icterus.       Right eye: No discharge.        Left eye: No discharge.     Extraocular Movements: Extraocular movements intact.     Conjunctiva/sclera: Conjunctivae normal.     Pupils: Pupils are equal, round, and reactive to light.  Cardiovascular:      Rate and Rhythm: Normal rate and regular rhythm.     Pulses: Normal pulses.     Heart sounds: Normal heart sounds. No murmur heard.    No friction rub. No gallop.  Pulmonary:     Effort: Pulmonary effort is normal. No respiratory distress.     Breath sounds: Normal breath sounds. No stridor. No wheezing, rhonchi or rales.  Chest:     Chest wall: No tenderness.  Musculoskeletal:        General: Normal range of motion.     Cervical back: Normal range of motion and neck supple.  Skin:    General: Skin is warm and dry.     Capillary Refill: Capillary refill takes less than 2 seconds.     Coloration: Skin is not jaundiced or pale.     Findings: No bruising, erythema, lesion or rash.  Neurological:     General: No focal deficit present.     Mental Status: She is alert and oriented to person, place, and time. Mental status is at baseline.  Psychiatric:        Mood and Affect: Mood normal.        Behavior: Behavior normal.        Thought Content: Thought content normal.        Judgment: Judgment normal.     Results for orders placed or performed in visit on 07/06/23  Bayer DCA Hb A1c Waived   Collection Time: 07/06/23  8:55 AM  Result Value Ref Range   HB A1C (BAYER DCA - WAIVED) 5.9 (H) 4.8 - 5.6 %      Assessment & Plan:   Problem List Items Addressed This Visit       Endocrine   Type 2 diabetes mellitus with complication, without long-term current use of insulin (HCC) - Primary   Did not cut down on her metformin. Will cut back to 500mg  BID and recheck in 3 months. Call with any concerns. Continue to monitor.       Relevant Medications   metFORMIN (GLUCOPHAGE) 500 MG tablet   Other Relevant Orders   Bayer DCA Hb A1c Waived (Completed)     Other   Moderate episode of recurrent major depressive disorder (HCC)   Has been taking both her wellbutrins at the same time. Will change to XR and recheck in 3 months. Doing much better. Continue to monitor.       Relevant  Medications   buPROPion (WELLBUTRIN XL) 300 MG 24 hr tablet   PARoxetine (PAXIL) 30 MG tablet     Follow up plan: Return  in about 3 months (around 10/03/2023).

## 2023-08-23 ENCOUNTER — Ambulatory Visit: Payer: Self-pay | Admitting: Family Medicine

## 2023-08-23 NOTE — Telephone Encounter (Signed)
  Chief Complaint: neuro deficit  Symptoms: feels like shaking Frequency: constant   Disposition: [] ED /[] Urgent Care (no appt availability in office) / [x] Appointment(In office/virtual)/ []  [facility]/ [] Home Care/ [] Refused Recommended Disposition /[] [facility]/ []  Follow-up with PCP Additional Notes: Pt complains of feeling like she is shaking but isn't for last 2 weeks. "I feel like my whole body is going to vibrate apart." Pt has also noticed seeing "bugs" when looking down, but nothing there. Pt stated Shingles showed up 2 weeks ago just after these symptoms.  Pt thought the shaky feeling was from being hungry, but still present after eating. Pt denies any abnormalities with stroke assessment. No headache. Pt states she needs to use a buggy to lean on while shopping for unsteady gait if walking long distance. Speech is clear. Pt is afraid this is brain tumor. RN gave precise care advice/instructions on when to call back/what to look for related to Stroke symptoms. Pt verbalized understanding. Pt has soonest appt tomorrow with PCP at 0920.        Copied from CRM [id number]. Topic: Clinical - Red Word Triage >> Aug 23, 2023  9:44 AM Izetta Dakin wrote: [facility] that prompted transfer to Nurse Triage: vision,  seeing things that are not there and moving Reason for Disposition  [1] Numbness or tingling on both sides of body AND [2] is a new symptom present > 24 hours  Answer Assessment - Initial Assessment Questions 1. SYMPTOM: "What is the main symptom you are concerned about?" (e.g., weakness, numbness)     Feels like shaking but not  2. ONSET: "When did this start?" (minutes, hours, days; while sleeping)     2 weeks  3. LAST NORMAL: "When was the last time you (the patient) were normal (no symptoms)?"     Over 2 weeks ago  4. PATTERN "Does this come and go, or has it been constant since it started?"  "Is it present now?"     Constant  5. CARDIAC SYMPTOMS: "Have you  had any of the following symptoms: chest pain, difficulty breathing, palpitations?"     Denies  6. NEUROLOGIC SYMPTOMS: "Have you had any of the following symptoms: headache, dizziness, vision loss, double vision, changes in speech, unsteady on your feet?"     Vision changes- seeing things move, dizzy  7. OTHER SYMPTOMS: "Do you have any other symptoms?"     When walking, needs cane/walker to be steady  Protocols used: Neurologic Deficit-A-AH

## 2023-08-24 ENCOUNTER — Ambulatory Visit (INDEPENDENT_AMBULATORY_CARE_PROVIDER_SITE_OTHER): Admitting: Family Medicine

## 2023-08-24 ENCOUNTER — Encounter: Payer: Self-pay | Admitting: Family Medicine

## 2023-08-24 VITALS — BP 148/63 | HR 73 | Wt 152.0 lb

## 2023-08-24 DIAGNOSIS — R42 Dizziness and giddiness: Secondary | ICD-10-CM | POA: Diagnosis not present

## 2023-08-24 DIAGNOSIS — R410 Disorientation, unspecified: Secondary | ICD-10-CM | POA: Diagnosis not present

## 2023-08-24 DIAGNOSIS — B0229 Other postherpetic nervous system involvement: Secondary | ICD-10-CM

## 2023-08-24 LAB — MICROSCOPIC EXAMINATION: RBC, Urine: NONE SEEN /HPF (ref 0–2)

## 2023-08-24 LAB — URINALYSIS, ROUTINE W REFLEX MICROSCOPIC
Bilirubin, UA: NEGATIVE
Glucose, UA: NEGATIVE
Ketones, UA: NEGATIVE
Leukocytes,UA: NEGATIVE
Nitrite, UA: NEGATIVE
Protein,UA: NEGATIVE
RBC, UA: NEGATIVE
Specific Gravity, UA: 1.02 (ref 1.005–1.030)
Urobilinogen, Ur: 4 mg/dL — ABNORMAL HIGH (ref 0.2–1.0)
pH, UA: 7.5 (ref 5.0–7.5)

## 2023-08-24 LAB — BAYER DCA HB A1C WAIVED: HB A1C (BAYER DCA - WAIVED): 5.9 % — ABNORMAL HIGH (ref 4.8–5.6)

## 2023-08-24 NOTE — Patient Instructions (Addendum)
 You have an appointment with Physical Therapy today at 12:00 pm. The address is  [address]  [address] [place], [place]

## 2023-08-24 NOTE — Progress Notes (Signed)
 BP (!) 148/63 (BP Location: Left Arm, Patient Position: Sitting)   Pulse 73   Wt 152 lb (68.9 kg)   BMI 27.61 kg/m    Subjective:    Patient ID: Susan Allen, female    DOB: [birth year], 83 y.o.   MRN: [id number]  HPI: AKEELAH Allen is a 83 y.o. female  Chief Complaint  Patient presents with   Shaking   Dizziness   Extremity Weakness   Started 2 weeks ago with feeling like her blood sugar was low. Feeling shakey on the inside. It's been constant and getting worse over the past 2 weeks. Started with a fine tremor this AM. Has also been confused and forgetful. Has been forgetting "everything." More brain fog, not forgetting where she is. This has also been getting worse.     DIZZINESS Duration: 2 weeks Description of symptoms: lightheaded Duration of episode: minutes Dizziness frequency: recurrent Provoking factors: looking at something Triggered by rolling over in bed: no Triggered by bending over: no Aggravated by head movement: no Aggravated by exertion, coughing, loud noises: no Recent head injury: no Recent or current viral symptoms: no History of vasovagal episodes: no Nausea: no Vomiting: no Tinnitus: yes Hearing loss: yes Aural fullness: no Headache: yes Photophobia/phonophobia: no Unsteady gait: yes Postural instability: yes Diplopia, dysarthria, dysphagia or weakness: no Related to exertion: no Pallor: no Diaphoresis: no Dyspnea: no Chest pain: no   Relevant past medical, surgical, family and social history reviewed and updated as indicated. Interim medical history since our last visit reviewed. Allergies and medications reviewed and updated.  Review of Systems  Constitutional:  Positive for unexpected weight change (has been doing weight watchers). Negative for activity change, appetite change, chills, diaphoresis, fatigue and fever.  HENT: Negative.    Eyes: Negative.   Respiratory: Negative.    Cardiovascular: Negative.   Gastrointestinal:  Negative.   Genitourinary: Negative.   Musculoskeletal: Negative.   Neurological:  Positive for dizziness, tremors, weakness (heavy, tired feeling) and light-headedness. Negative for seizures, syncope, facial asymmetry, speech difficulty, numbness and headaches.  Hematological: Negative.   Psychiatric/Behavioral:  Positive for confusion. Negative for agitation, behavioral problems, decreased concentration, dysphoric mood, hallucinations, self-injury, sleep disturbance and suicidal ideas. The patient is not nervous/anxious and is not hyperactive.     Per HPI unless specifically indicated above     Objective:    BP (!) 148/63 (BP Location: Left Arm, Patient Position: Sitting)   Pulse 73   Wt 152 lb (68.9 kg)   BMI 27.61 kg/m   Wt Readings from Last 3 Encounters:  08/24/23 152 lb (68.9 kg)  07/06/23 164 lb 3.2 oz (74.5 kg)  05/21/23 165 lb 3.2 oz (74.9 kg)    Physical Exam Vitals and nursing note reviewed.  Constitutional:      General: She is not in acute distress.    Appearance: Normal appearance. She is normal weight. She is not ill-appearing, toxic-appearing or diaphoretic.  HENT:     Head: Normocephalic and atraumatic.     Right Ear: Tympanic membrane, ear canal and external ear normal. There is no impacted cerumen.     Left Ear: Tympanic membrane and external ear normal. There is no impacted cerumen.     Nose: Nose normal. No congestion or rhinorrhea.     Mouth/Throat:     Mouth: Mucous membranes are moist.     Pharynx: Oropharynx is clear. No oropharyngeal exudate or posterior oropharyngeal erythema.  Eyes:     General: No scleral  icterus.       Right eye: No discharge.        Left eye: No discharge.     Extraocular Movements: Extraocular movements intact.     Right eye: Nystagmus present. Normal extraocular motion.     Left eye: Normal extraocular motion and no nystagmus.     Conjunctiva/sclera: Conjunctivae normal.     Pupils: Pupils are equal, round, and reactive to  light.     Comments: Nystagmus to R and upwards  Cardiovascular:     Rate and Rhythm: Normal rate and regular rhythm.     Pulses: Normal pulses.     Heart sounds: Normal heart sounds. No murmur heard.    No friction rub. No gallop.  Pulmonary:     Effort: Pulmonary effort is normal. No respiratory distress.     Breath sounds: Normal breath sounds. No stridor. No wheezing, rhonchi or rales.  Chest:     Chest wall: No tenderness.  Musculoskeletal:        General: Normal range of motion.     Cervical back: Normal range of motion and neck supple.  Skin:    General: Skin is warm and dry.     Capillary Refill: Capillary refill takes less than 2 seconds.     Coloration: Skin is not jaundiced or pale.     Findings: Rash (dried shingles on R ear and neck) present. No bruising, erythema or lesion.  Neurological:     General: No focal deficit present.     Mental Status: She is alert and oriented to person, place, and time. Mental status is at baseline.     Cranial Nerves: No cranial nerve deficit.     Sensory: No sensory deficit.     Motor: No weakness.     Coordination: Coordination normal.     Gait: Gait abnormal.     Deep Tendon Reflexes: Reflexes normal.  Psychiatric:        Mood and Affect: Mood normal.        Behavior: Behavior normal.        Thought Content: Thought content normal.        Judgment: Judgment normal.     Results for orders placed or performed in visit on 07/06/23  Bayer DCA Hb A1c Waived   Collection Time: 07/06/23  8:55 AM  Result Value Ref Range   HB A1C (BAYER DCA - WAIVED) 5.9 (H) 4.8 - 5.6 %      Assessment & Plan:   Problem List Items Addressed This Visit       Other   Vertigo - Primary   Suspect post-herpetic irritation. Will check labs and get her in for vestibular PT ASAP. If not getting better in 1 week will order MRI brain w/wo. Follow up 1 week.       Relevant Orders   Ambulatory referral to Physical Therapy   Other Visit Diagnoses        Confusion       UA clear- will check labs. More brain fog than confusion.   Relevant Orders   Urinalysis, Routine w reflex microscopic   Urine Culture     Dizziness       Relevant Orders   CBC with Differential/Platelet   Bayer DCA Hb A1c Waived   Comprehensive metabolic panel with GFR   TSH   VITAMIN D 25 Hydroxy (Vit-D Deficiency, Fractures)   Magnesium   Phosphorus     Other postherpetic nervous system involvement  Follow up plan: Return in about 1 week (around 08/31/2023) for OK to use same day.   41 minutes spent with patient and husband today

## 2023-08-24 NOTE — Assessment & Plan Note (Signed)
 Suspect post-herpetic irritation. Will check labs and get her in for vestibular PT ASAP. If not getting better in 1 week will order MRI brain w/wo. Follow up 1 week.

## 2023-08-25 LAB — CBC WITH DIFFERENTIAL/PLATELET
Basophils Absolute: 0.1 10*3/uL (ref 0.0–0.2)
Basos: 1 %
EOS (ABSOLUTE): 0.2 10*3/uL (ref 0.0–0.4)
Eos: 4 %
Hematocrit: 36.6 % (ref 34.0–46.6)
Hemoglobin: 11.6 g/dL (ref 11.1–15.9)
Immature Grans (Abs): 0.1 10*3/uL (ref 0.0–0.1)
Immature Granulocytes: 1 %
Lymphocytes Absolute: 1.6 10*3/uL (ref 0.7–3.1)
Lymphs: 29 %
MCH: 26.5 pg — ABNORMAL LOW (ref 26.6–33.0)
MCHC: 31.7 g/dL (ref 31.5–35.7)
MCV: 84 fL (ref 79–97)
Monocytes Absolute: 0.6 10*3/uL (ref 0.1–0.9)
Monocytes: 12 %
Neutrophils Absolute: 2.9 10*3/uL (ref 1.4–7.0)
Neutrophils: 53 %
Platelets: 122 10*3/uL — ABNORMAL LOW (ref 150–450)
RBC: 4.37 x10E6/uL (ref 3.77–5.28)
RDW: 13.5 % (ref 11.7–15.4)
WBC: 5.3 10*3/uL (ref 3.4–10.8)

## 2023-08-25 LAB — COMPREHENSIVE METABOLIC PANEL WITH GFR
ALT: 10 IU/L (ref 0–32)
AST: 17 IU/L (ref 0–40)
Albumin: 3.9 g/dL (ref 3.7–4.7)
Alkaline Phosphatase: 50 IU/L (ref 44–121)
BUN/Creatinine Ratio: 19 (ref 12–28)
BUN: 20 mg/dL (ref 8–27)
Bilirubin Total: 0.4 mg/dL (ref 0.0–1.2)
CO2: 25 mmol/L (ref 20–29)
Calcium: 9.5 mg/dL (ref 8.7–10.3)
Chloride: 90 mmol/L — ABNORMAL LOW (ref 96–106)
Creatinine, Ser: 1.06 mg/dL — ABNORMAL HIGH (ref 0.57–1.00)
Globulin, Total: 2.4 g/dL (ref 1.5–4.5)
Glucose: 123 mg/dL — ABNORMAL HIGH (ref 70–99)
Potassium: 4.4 mmol/L (ref 3.5–5.2)
Sodium: 130 mmol/L — ABNORMAL LOW (ref 134–144)
Total Protein: 6.3 g/dL (ref 6.0–8.5)
eGFR: 52 mL/min/{1.73_m2} — ABNORMAL LOW (ref >59)

## 2023-08-25 LAB — MAGNESIUM: Magnesium: 1.6 mg/dL (ref 1.6–2.3)

## 2023-08-25 LAB — VITAMIN D 25 HYDROXY (VIT D DEFICIENCY, FRACTURES): Vit D, 25-Hydroxy: 36 ng/mL (ref 30.0–100.0)

## 2023-08-25 LAB — TSH: TSH: 2.59 u[IU]/mL (ref 0.450–4.500)

## 2023-08-25 LAB — PHOSPHORUS: Phosphorus: 3.6 mg/dL (ref 3.0–4.3)

## 2023-08-26 LAB — URINE CULTURE

## 2023-08-27 ENCOUNTER — Encounter: Payer: Self-pay | Admitting: Family Medicine

## 2023-08-30 DIAGNOSIS — I2510 Atherosclerotic heart disease of native coronary artery without angina pectoris: Secondary | ICD-10-CM | POA: Diagnosis not present

## 2023-08-30 DIAGNOSIS — R42 Dizziness and giddiness: Secondary | ICD-10-CM | POA: Diagnosis not present

## 2023-08-30 DIAGNOSIS — E118 Type 2 diabetes mellitus with unspecified complications: Secondary | ICD-10-CM | POA: Diagnosis not present

## 2023-08-30 DIAGNOSIS — E782 Mixed hyperlipidemia: Secondary | ICD-10-CM | POA: Diagnosis not present

## 2023-08-30 DIAGNOSIS — I10 Essential (primary) hypertension: Secondary | ICD-10-CM | POA: Diagnosis not present

## 2023-08-30 DIAGNOSIS — I700 Atherosclerosis of aorta: Secondary | ICD-10-CM | POA: Diagnosis not present

## 2023-08-30 DIAGNOSIS — K219 Gastro-esophageal reflux disease without esophagitis: Secondary | ICD-10-CM | POA: Diagnosis not present

## 2023-08-31 ENCOUNTER — Encounter: Payer: Self-pay | Admitting: Family Medicine

## 2023-08-31 ENCOUNTER — Ambulatory Visit (INDEPENDENT_AMBULATORY_CARE_PROVIDER_SITE_OTHER): Admitting: Family Medicine

## 2023-08-31 ENCOUNTER — Ambulatory Visit
Admission: RE | Admit: 2023-08-31 | Discharge: 2023-08-31 | Disposition: A | Discharge status: Home / Self Care | Source: Ambulatory Visit | Attending: Family Medicine | Admitting: Family Medicine

## 2023-08-31 VITALS — BP 144/75 | HR 66

## 2023-08-31 DIAGNOSIS — R410 Disorientation, unspecified: Secondary | ICD-10-CM | POA: Insufficient documentation

## 2023-08-31 DIAGNOSIS — R42 Dizziness and giddiness: Secondary | ICD-10-CM

## 2023-08-31 MED ORDER — GADOBUTROL 1 MMOL/ML IV SOLN
7.0000 mL | Freq: Once | INTRAVENOUS | Status: AC | PRN
  Administered 2023-08-31: 7 mL via INTRAVENOUS

## 2023-08-31 NOTE — Assessment & Plan Note (Signed)
 No better. Will check MRI w and wo of her brain to look for vestibular neuroma. Will get her into neurology. Continue to follow with PT for vestibular rehab. Call with any concerns.

## 2023-08-31 NOTE — Progress Notes (Signed)
 BP (!) 144/75 (BP Location: Left Arm, Patient Position: Sitting, Cuff Size: Large)   Pulse 66    Subjective:    Patient ID: Susan Allen, female    DOB: 1940/07/26, 83 y.o.   MRN: [id number]  HPI: Susan Allen is a 83 y.o. female  Chief Complaint  Patient presents with   Dizziness    Pt states that she feels that her shaking and dizziness has gotten worse since her last visit with Dr. Laural Benes. She stated that PT has been ineffective and no help at home.    Shingles is a bit better. Shakey feeling woke her up this AM- it feels like it's getting worse. Dizziness is about the same. No better with PT so far. She has gone 2 days so far and has a follow up with them scheduled. Se saw cardiology yesterday for her regular appointment and is to have an echo and a carotid US. She has otherwise been feeling well. No other concerns or complaints at this time.  Relevant past medical, surgical, family and social history reviewed and updated as indicated. Interim medical history since our last visit reviewed. Allergies and medications reviewed and updated.  Review of Systems  Constitutional: Negative.   HENT: Negative.    Respiratory: Negative.    Cardiovascular: Negative.   Neurological:  Positive for dizziness, tremors and light-headedness. Negative for seizures, syncope, facial asymmetry, speech difficulty, weakness, numbness and headaches.  Hematological: Negative.   Psychiatric/Behavioral:  Positive for hallucinations (item of clothing in her hamper, dirt moving on the floor). Negative for agitation, behavioral problems, confusion, decreased concentration, dysphoric mood, self-injury, sleep disturbance and suicidal ideas. The patient is not nervous/anxious and is not hyperactive.     Per HPI unless specifically indicated above     Objective:    BP (!) 144/75 (BP Location: Left Arm, Patient Position: Sitting, Cuff Size: Large)   Pulse 66   Wt Readings from Last 3 Encounters:  08/24/23 152  lb (68.9 kg)  07/06/23 164 lb 3.2 oz (74.5 kg)  05/21/23 165 lb 3.2 oz (74.9 kg)    Physical Exam Vitals and nursing note reviewed.  Constitutional:      General: She is not in acute distress.    Appearance: Normal appearance. She is normal weight. She is not ill-appearing, toxic-appearing or diaphoretic.  HENT:     Head: Normocephalic and atraumatic.     Right Ear: External ear normal.     Left Ear: External ear normal.     Nose: Nose normal.     Mouth/Throat:     Mouth: Mucous membranes are moist.     Pharynx: Oropharynx is clear.  Eyes:     General: No scleral icterus.       Right eye: No discharge.        Left eye: No discharge.     Extraocular Movements: Extraocular movements intact.     Right eye: Normal extraocular motion and no nystagmus.     Left eye: Normal extraocular motion and no nystagmus.     Conjunctiva/sclera: Conjunctivae normal.     Pupils: Pupils are equal, round, and reactive to light.  Cardiovascular:     Rate and Rhythm: Normal rate and regular rhythm.     Pulses: Normal pulses.     Heart sounds: Normal heart sounds. No murmur heard.    No friction rub. No gallop.  Pulmonary:     Effort: Pulmonary effort is normal. No respiratory distress.  Breath sounds: Normal breath sounds. No stridor. No wheezing, rhonchi or rales.  Chest:     Chest wall: No tenderness.  Musculoskeletal:        General: Normal range of motion.     Cervical back: Normal range of motion and neck supple.  Skin:    General: Skin is warm and dry.     Capillary Refill: Capillary refill takes less than 2 seconds.     Coloration: Skin is not jaundiced or pale.     Findings: No bruising, erythema, lesion or rash.  Neurological:     General: No focal deficit present.     Mental Status: She is alert and oriented to person, place, and time. Mental status is at baseline.  Psychiatric:        Mood and Affect: Mood normal.        Behavior: Behavior normal.        Thought Content:  Thought content normal.        Judgment: Judgment normal.     Results for orders placed or performed in visit on 08/24/23  Urine Culture   Collection Time: 08/24/23  9:29 AM   Specimen: Urine   UR  Result Value Ref Range   Urine Culture, Routine Final report    Organism ID, Bacteria Comment   Microscopic Examination   Collection Time: 08/24/23  9:29 AM   Urine  Result Value Ref Range   WBC, UA 0-5 0 - 5 /hpf   RBC, Urine None seen 0 - 2 /hpf   Epithelial Cells (non renal) 0-10 0 - 10 /hpf   Crystals Present (A) N/A   Crystal Type Amorphous Sediment N/A   Bacteria, UA Few None seen/Few  Urinalysis, Routine w reflex microscopic   Collection Time: 08/24/23  9:29 AM  Result Value Ref Range   Specific Gravity, UA 1.020 1.005 - 1.030   pH, UA 7.5 5.0 - 7.5   Color, UA Yellow Yellow   Appearance Ur Cloudy (A) Clear   Leukocytes,UA Negative Negative   Protein,UA Negative Negative/Trace   Glucose, UA Negative Negative   Ketones, UA Negative Negative   RBC, UA Negative Negative   Bilirubin, UA Negative Negative   Urobilinogen, Ur 4.0 (H) 0.2 - 1.0 mg/dL   Nitrite, UA Negative Negative   Microscopic Examination See below:   Bayer DCA Hb A1c Waived   Collection Time: 08/24/23  9:52 AM  Result Value Ref Range   HB A1C (BAYER DCA - WAIVED) 5.9 (H) 4.8 - 5.6 %  CBC with Differential/Platelet   Collection Time: 08/24/23  9:53 AM  Result Value Ref Range   WBC 5.3 3.4 - 10.8 x10E3/uL   RBC 4.37 3.77 - 5.28 x10E6/uL   Hemoglobin 11.6 11.1 - 15.9 g/dL   Hematocrit 54.0 98.1 - 46.6 %   MCV 84 79 - 97 fL   MCH 26.5 (L) 26.6 - 33.0 pg   MCHC 31.7 31.5 - 35.7 g/dL   RDW 19.1 47.8 - 29.5 %   Platelets 122 (L) 150 - 450 x10E3/uL   Neutrophils 53 Not Estab. %   Lymphs 29 Not Estab. %   Monocytes 12 Not Estab. %   Eos 4 Not Estab. %   Basos 1 Not Estab. %   Neutrophils Absolute 2.9 1.4 - 7.0 x10E3/uL   Lymphocytes Absolute 1.6 0.7 - 3.1 x10E3/uL   Monocytes Absolute 0.6 0.1 - 0.9  x10E3/uL   EOS (ABSOLUTE) 0.2 0.0 - 0.4 x10E3/uL   Basophils  Absolute 0.1 0.0 - 0.2 x10E3/uL   Immature Granulocytes 1 Not Estab. %   Immature Grans (Abs) 0.1 0.0 - 0.1 x10E3/uL   Hematology Comments: Note:   Comprehensive metabolic panel with GFR   Collection Time: 08/24/23  9:53 AM  Result Value Ref Range   Glucose 123 (H) 70 - 99 mg/dL   BUN 20 8 - 27 mg/dL   Creatinine, Ser 4.09 (H) 0.57 - 1.00 mg/dL   eGFR 52 (L) >81 XB/JYN/8.29   BUN/Creatinine Ratio 19 12 - 28   Sodium 130 (L) 134 - 144 mmol/L   Potassium 4.4 3.5 - 5.2 mmol/L   Chloride 90 (L) 96 - 106 mmol/L   CO2 25 20 - 29 mmol/L   Calcium 9.5 8.7 - 10.3 mg/dL   Total Protein 6.3 6.0 - 8.5 g/dL   Albumin 3.9 3.7 - 4.7 g/dL   Globulin, Total 2.4 1.5 - 4.5 g/dL   Bilirubin Total 0.4 0.0 - 1.2 mg/dL   Alkaline Phosphatase 50 44 - 121 IU/L   AST 17 0 - 40 IU/L   ALT 10 0 - 32 IU/L  TSH   Collection Time: 08/24/23  9:53 AM  Result Value Ref Range   TSH 2.590 0.450 - 4.500 uIU/mL  VITAMIN D 25 Hydroxy (Vit-D Deficiency, Fractures)   Collection Time: 08/24/23  9:53 AM  Result Value Ref Range   Vit D, 25-Hydroxy 36.0 30.0 - 100.0 ng/mL  Magnesium   Collection Time: 08/24/23  9:53 AM  Result Value Ref Range   Magnesium 1.6 1.6 - 2.3 mg/dL  Phosphorus   Collection Time: 08/24/23  9:53 AM  Result Value Ref Range   Phosphorus 3.6 3.0 - 4.3 mg/dL      Assessment & Plan:   Problem List Items Addressed This Visit       Other   Vertigo - Primary   No better. Will check MRI w and wo of her brain to look for vestibular neuroma. Will get her into neurology. Continue to follow with PT for vestibular rehab. Call with any concerns.       Relevant Orders   MR Brain W Wo Contrast   Other Visit Diagnoses       Confusion       See discussion under vertigo- will obtain MRI of brain w and wo.   Relevant Orders   MR Brain W Wo Contrast     Dizziness       See discussion under vertigo- will obtain MRI of brain w and wo.    Relevant Orders   MR Brain W Wo Contrast   Ambulatory referral to Neurology        Follow up plan: Return for As scheduled.

## 2023-09-06 ENCOUNTER — Telehealth: Payer: Self-pay

## 2023-09-06 NOTE — Telephone Encounter (Signed)
 Copied from CRM [id number]. Topic: Clinical - Medical Advice >> Sep 06, 2023  9:34 AM [name] wrote: Reason for CRM:  I reviewed the test results from her MRI. Susan Allen was happy with the results. Susan Allen wants to know if this could be inner ear issues because she still has the shaking feeling but when she lays down she doesn't have that feeling. Please send her a message through MyChart.

## 2023-09-06 NOTE — Telephone Encounter (Signed)
 It could be, but I would expect her to be getting better with PT if that was the case- let's see what neurology says

## 2023-09-06 NOTE — Telephone Encounter (Signed)
 Message sent to patient via mychart as directed in first message with information from Dr. Lincoln Renshaw.

## 2023-09-08 DIAGNOSIS — R42 Dizziness and giddiness: Secondary | ICD-10-CM | POA: Diagnosis not present

## 2023-09-24 DIAGNOSIS — M51362 Other intervertebral disc degeneration, lumbar region with discogenic back pain and lower extremity pain: Secondary | ICD-10-CM | POA: Diagnosis not present

## 2023-09-24 DIAGNOSIS — M25552 Pain in left hip: Secondary | ICD-10-CM | POA: Diagnosis not present

## 2023-09-24 DIAGNOSIS — M25551 Pain in right hip: Secondary | ICD-10-CM | POA: Diagnosis not present

## 2023-09-24 DIAGNOSIS — M5416 Radiculopathy, lumbar region: Secondary | ICD-10-CM | POA: Diagnosis not present

## 2023-09-24 DIAGNOSIS — G8929 Other chronic pain: Secondary | ICD-10-CM | POA: Diagnosis not present

## 2023-10-07 ENCOUNTER — Encounter: Payer: Self-pay | Admitting: Family Medicine

## 2023-10-07 ENCOUNTER — Ambulatory Visit (INDEPENDENT_AMBULATORY_CARE_PROVIDER_SITE_OTHER): Payer: Medicare HMO | Admitting: Family Medicine

## 2023-10-07 VITALS — BP 160/64 | HR 49 | Temp 97.7°F | Ht 62.0 in | Wt 164.2 lb

## 2023-10-07 DIAGNOSIS — R42 Dizziness and giddiness: Secondary | ICD-10-CM | POA: Diagnosis not present

## 2023-10-07 DIAGNOSIS — F331 Major depressive disorder, recurrent, moderate: Secondary | ICD-10-CM | POA: Diagnosis not present

## 2023-10-07 DIAGNOSIS — I10 Essential (primary) hypertension: Secondary | ICD-10-CM

## 2023-10-07 MED ORDER — LOSARTAN POTASSIUM 50 MG PO TABS: 50.0000 mg | ORAL_TABLET | Freq: Every day | ORAL | 0 refills | Status: DC

## 2023-10-07 MED ORDER — METOPROLOL SUCCINATE ER 25 MG PO TB24: 25.0000 mg | ORAL_TABLET | Freq: Every day | ORAL | 0 refills | Status: DC

## 2023-10-07 MED ORDER — TRAZODONE HCL 50 MG PO TABS: 50.0000 mg | ORAL_TABLET | Freq: Every day | ORAL | 0 refills | Status: DC

## 2023-10-07 MED ORDER — AMLODIPINE BESYLATE 2.5 MG PO TABS: 1.2500 mg | ORAL_TABLET | Freq: Every day | ORAL | 0 refills | Status: DC

## 2023-10-07 MED ORDER — PAROXETINE HCL 30 MG PO TABS: 60.0000 mg | ORAL_TABLET | Freq: Every day | ORAL | 0 refills | Status: DC

## 2023-10-07 MED ORDER — FENOFIBRATE 145 MG PO TABS: 145.0000 mg | ORAL_TABLET | Freq: Every day | ORAL | 0 refills | Status: DC

## 2023-10-07 MED ORDER — HYDROCHLOROTHIAZIDE 25 MG PO TABS: 25.0000 mg | ORAL_TABLET | Freq: Every day | ORAL | 0 refills | Status: DC

## 2023-10-07 MED ORDER — OMEPRAZOLE 40 MG PO CPDR: 40.0000 mg | DELAYED_RELEASE_CAPSULE | Freq: Every day | ORAL | 0 refills | Status: DC

## 2023-10-07 MED ORDER — ISOSORBIDE MONONITRATE ER 60 MG PO TB24: 60.0000 mg | ORAL_TABLET | Freq: Every day | ORAL | 0 refills | Status: DC

## 2023-10-07 NOTE — Assessment & Plan Note (Signed)
 Resolved. No concerns. Continue to monitor.

## 2023-10-07 NOTE — Assessment & Plan Note (Signed)
 Still running a little high. Will add in amlodipine and recheck in 3 months. Call with any concerns.

## 2023-10-07 NOTE — Progress Notes (Signed)
 BP (!) 160/64   Pulse (!) 49   Temp 97.7 F (36.5 C) (Oral)   Ht 5\' 2"  (1.575 m)   Wt 164 lb 3.2 oz (74.5 kg)   SpO2 96%   BMI 30.03 kg/m    Subjective:    Patient ID: Susan Allen, female    DOB: 10/09/1940, 83 y.o.   MRN: [id number]  HPI: Susan Allen is a 83 y.o. female  Chief Complaint  Patient presents with   Dizziness    Pt states that her vertigo has cleared. Has not have any episodes since her last visit with Dr. Lincoln Renshaw. Pt states that she believes that the Bupropion  contributed to the dizziness. She stated that after stopping it, the vertigo cleared up.     HYPERTENSION  Hypertension status: controlled  Satisfied with current treatment? yes Duration of hypertension: chronic BP monitoring frequency:  a few times a month BP medication side effects:  no Medication compliance: excellent compliance Previous BP meds:amlodipine, hydrochlorothiazide , imdur , metoprolol , losartan  Aspirin: no Recurrent headaches: no Visual changes: no Palpitations: no Dyspnea: no Chest pain: no Lower extremity edema: no Dizzy/lightheaded: no  Vertigo has resolved. Feeling like herself.   DEPRESSION Mood status: better Satisfied with current treatment?: yes Symptom severity: mild  Duration of current treatment : chronic Side effects: no Medication compliance: excellent compliance Psychotherapy/counseling: no  Previous psychiatric medications: paxil , Wellbutrin  (dizziness) Depressed mood: no Anxious mood: no Anhedonia: no Significant weight loss or gain: no Insomnia: no  Fatigue: no Feelings of worthlessness or guilt: no Impaired concentration/indecisiveness: no Suicidal ideations: no Hopelessness: no Crying spells: no    10/07/2023    9:52 AM 08/31/2023    9:18 AM 07/06/2023    8:50 AM 05/21/2023   10:32 AM 11/18/2022   11:17 AM  Depression screen PHQ 2/9  Decreased Interest 0 2 2 3 2   Down, Depressed, Hopeless 0 2 1 3 2   PHQ - 2 Score 0 4 3 6 4   Altered sleeping 0 0 0  0 1  Tired, decreased energy 0 2 3 3 2   Change in appetite 0 2 0 3 2  Feeling bad or failure about yourself  0 1 0 3 2  Trouble concentrating 0 2 0 3 0  Moving slowly or fidgety/restless 0 1 0 3 0  Suicidal thoughts 0 0 0  0  PHQ-9 Score 0 12 6 21 11   Difficult doing work/chores Not difficult at all  Not difficult at all  Somewhat difficult      Relevant past medical, surgical, family and social history reviewed and updated as indicated. Interim medical history since our last visit reviewed. Allergies and medications reviewed and updated.  Review of Systems  Constitutional: Negative.   Respiratory: Negative.    Cardiovascular: Negative.   Musculoskeletal: Negative.   Neurological: Negative.   Psychiatric/Behavioral: Negative.      Per HPI unless specifically indicated above     Objective:     BP (!) 160/64   Pulse (!) 49   Temp 97.7 F (36.5 C) (Oral)   Ht 5\' 2"  (1.575 m)   Wt 164 lb 3.2 oz (74.5 kg)   SpO2 96%   BMI 30.03 kg/m   Wt Readings from Last 3 Encounters:  10/07/23 164 lb 3.2 oz (74.5 kg)  08/24/23 152 lb (68.9 kg)  07/06/23 164 lb 3.2 oz (74.5 kg)    Physical Exam Vitals and nursing note reviewed.  Constitutional:      General: She is  not in acute distress.    Appearance: Normal appearance. She is not ill-appearing, toxic-appearing or diaphoretic.  HENT:     Head: Normocephalic and atraumatic.     Right Ear: External ear normal.     Left Ear: External ear normal.     Nose: Nose normal.     Mouth/Throat:     Mouth: Mucous membranes are moist.     Pharynx: Oropharynx is clear.  Eyes:     General: No scleral icterus.       Right eye: No discharge.        Left eye: No discharge.     Extraocular Movements: Extraocular movements intact.     Conjunctiva/sclera: Conjunctivae normal.     Pupils: Pupils are equal, round, and reactive to light.  Cardiovascular:     Rate and Rhythm: Normal rate and regular rhythm.     Pulses: Normal pulses.     Heart  sounds: Normal heart sounds. No murmur heard.    No friction rub. No gallop.  Pulmonary:     Effort: Pulmonary effort is normal. No respiratory distress.     Breath sounds: Normal breath sounds. No stridor. No wheezing, rhonchi or rales.  Chest:     Chest wall: No tenderness.  Musculoskeletal:        General: Normal range of motion.     Cervical back: Normal range of motion and neck supple.  Skin:    General: Skin is warm and dry.     Capillary Refill: Capillary refill takes less than 2 seconds.     Coloration: Skin is not jaundiced or pale.     Findings: No bruising, erythema, lesion or rash.  Neurological:     General: No focal deficit present.     Mental Status: She is alert and oriented to person, place, and time. Mental status is at baseline.  Psychiatric:        Mood and Affect: Mood normal.        Behavior: Behavior normal.        Thought Content: Thought content normal.        Judgment: Judgment normal.     Results for orders placed or performed in visit on 08/24/23  Urine Culture   Collection Time: 08/24/23  9:29 AM   Specimen: Urine   UR  Result Value Ref Range   Urine Culture, Routine Final report    Organism ID, Bacteria Comment   Microscopic Examination   Collection Time: 08/24/23  9:29 AM   Urine  Result Value Ref Range   WBC, UA 0-5 0 - 5 /hpf   RBC, Urine None seen 0 - 2 /hpf   Epithelial Cells (non renal) 0-10 0 - 10 /hpf   Crystals Present (A) N/A   Crystal Type Amorphous Sediment N/A   Bacteria, UA Few None seen/Few  Urinalysis, Routine w reflex microscopic   Collection Time: 08/24/23  9:29 AM  Result Value Ref Range   Specific Gravity, UA 1.020 1.005 - 1.030   pH, UA 7.5 5.0 - 7.5   Color, UA Yellow Yellow   Appearance Ur Cloudy (A) Clear   Leukocytes,UA Negative Negative   Protein,UA Negative Negative/Trace   Glucose, UA Negative Negative   Ketones, UA Negative Negative   RBC, UA Negative Negative   Bilirubin, UA Negative Negative    Urobilinogen, Ur 4.0 (H) 0.2 - 1.0 mg/dL   Nitrite, UA Negative Negative   Microscopic Examination See below:   Bayer DCA Hb A1c  Waived   Collection Time: 08/24/23  9:52 AM  Result Value Ref Range   HB A1C (BAYER DCA - WAIVED) 5.9 (H) 4.8 - 5.6 %  CBC with Differential/Platelet   Collection Time: 08/24/23  9:53 AM  Result Value Ref Range   WBC 5.3 3.4 - 10.8 x10E3/uL   RBC 4.37 3.77 - 5.28 x10E6/uL   Hemoglobin 11.6 11.1 - 15.9 g/dL   Hematocrit 40.9 81.1 - 46.6 %   MCV 84 79 - 97 fL   MCH 26.5 (L) 26.6 - 33.0 pg   MCHC 31.7 31.5 - 35.7 g/dL   RDW 91.4 78.2 - 95.6 %   Platelets 122 (L) 150 - 450 x10E3/uL   Neutrophils 53 Not Estab. %   Lymphs 29 Not Estab. %   Monocytes 12 Not Estab. %   Eos 4 Not Estab. %   Basos 1 Not Estab. %   Neutrophils Absolute 2.9 1.4 - 7.0 x10E3/uL   Lymphocytes Absolute 1.6 0.7 - 3.1 x10E3/uL   Monocytes Absolute 0.6 0.1 - 0.9 x10E3/uL   EOS (ABSOLUTE) 0.2 0.0 - 0.4 x10E3/uL   Basophils Absolute 0.1 0.0 - 0.2 x10E3/uL   Immature Granulocytes 1 Not Estab. %   Immature Grans (Abs) 0.1 0.0 - 0.1 x10E3/uL   Hematology Comments: Note:   Comprehensive metabolic panel with GFR   Collection Time: 08/24/23  9:53 AM  Result Value Ref Range   Glucose 123 (H) 70 - 99 mg/dL   BUN 20 8 - 27 mg/dL   Creatinine, Ser 2.13 (H) 0.57 - 1.00 mg/dL   eGFR 52 (L) >08 MV/HQI/6.96   BUN/Creatinine Ratio 19 12 - 28   Sodium 130 (L) 134 - 144 mmol/L   Potassium 4.4 3.5 - 5.2 mmol/L   Chloride 90 (L) 96 - 106 mmol/L   CO2 25 20 - 29 mmol/L   Calcium 9.5 8.7 - 10.3 mg/dL   Total Protein 6.3 6.0 - 8.5 g/dL   Albumin 3.9 3.7 - 4.7 g/dL   Globulin, Total 2.4 1.5 - 4.5 g/dL   Bilirubin Total 0.4 0.0 - 1.2 mg/dL   Alkaline Phosphatase 50 44 - 121 IU/L   AST 17 0 - 40 IU/L   ALT 10 0 - 32 IU/L  TSH   Collection Time: 08/24/23  9:53 AM  Result Value Ref Range   TSH 2.590 0.450 - 4.500 uIU/mL  VITAMIN D  25 Hydroxy (Vit-D Deficiency, Fractures)   Collection Time:  08/24/23  9:53 AM  Result Value Ref Range   Vit D, 25-Hydroxy 36.0 30.0 - 100.0 ng/mL  Magnesium   Collection Time: 08/24/23  9:53 AM  Result Value Ref Range   Magnesium 1.6 1.6 - 2.3 mg/dL  Phosphorus   Collection Time: 08/24/23  9:53 AM  Result Value Ref Range   Phosphorus 3.6 3.0 - 4.3 mg/dL      Assessment & Plan:   Problem List Items Addressed This Visit       Cardiovascular and Mediastinum   Essential hypertension   Still running a little high. Will add in amlodipine and recheck in 3 months. Call with any concerns.       Relevant Medications   fenofibrate  (TRICOR ) 145 MG tablet   hydrochlorothiazide  (HYDRODIURIL ) 25 MG tablet   isosorbide  mononitrate (IMDUR ) 60 MG 24 hr tablet   losartan  (COZAAR ) 50 MG tablet   metoprolol  succinate (TOPROL  XL) 25 MG 24 hr tablet   amLODipine (NORVASC) 2.5 MG tablet     Other  Moderate episode of recurrent major depressive disorder (HCC)   Doing well off wellbutrin . Call with any concerns. Continue to monitor.       Relevant Medications   PARoxetine  (PAXIL ) 30 MG tablet   traZODone  (DESYREL ) 50 MG tablet   Vertigo - Primary   Resolved. No concerns. Continue to monitor.         Follow up plan: Return in about 3 months (around 01/07/2024) for physical.

## 2023-10-07 NOTE — Assessment & Plan Note (Signed)
 Doing well off wellbutrin . Call with any concerns. Continue to monitor.

## 2023-10-12 ENCOUNTER — Encounter (INDEPENDENT_AMBULATORY_CARE_PROVIDER_SITE_OTHER): Payer: Self-pay

## 2023-12-24 ENCOUNTER — Other Ambulatory Visit: Payer: Self-pay | Admitting: Family Medicine

## 2023-12-24 NOTE — Telephone Encounter (Signed)
 Requested Prescriptions  Pending Prescriptions Disp Refills   amLODipine  (NORVASC ) 2.5 MG tablet [Pharmacy Med Name: amLODIPine  Besylate 2.5 MG Oral Tablet] 45 tablet 0    Sig: Take 1/2 (one-half) tablet by mouth once daily     Cardiovascular: Calcium Channel Blockers 2 Failed - 12/24/2023  5:20 PM      Failed - Last BP in normal range    BP Readings from Last 1 Encounters:  10/07/23 (!) 160/64         Passed - Last Heart Rate in normal range    Pulse Readings from Last 1 Encounters:  10/07/23 (!) 49         Passed - Valid encounter within last 6 months    Recent Outpatient Visits           2 months ago Vertigo   [place] [facility] [place], [place], DO   3 months ago Vertigo   [place] [facility] [place], [place], DO   4 months ago Vertigo   [place] [facility] [place], [place] P, DO   5 months ago Type 2 diabetes mellitus with complication, without long-term current use of insulin [facility])   [place] [facility] [place], [place], DO

## 2024-01-01 ENCOUNTER — Other Ambulatory Visit: Payer: Self-pay | Admitting: Family Medicine

## 2024-01-04 NOTE — Telephone Encounter (Signed)
 Requested medication (s) are due for refill today: yes  Requested medication (s) are on the active medication list: yes  Last refill:  11/18/22 #90 3 RF  Future visit scheduled: yes  Notes to clinic:  plt 122  creat 1.06    Requested Prescriptions  Pending Prescriptions Disp Refills   clopidogrel  (PLAVIX ) 75 MG tablet [Pharmacy Med Name: Clopidogrel  Bisulfate 75 MG Oral Tablet] 90 tablet 0    Sig: Take 1 tablet by mouth once daily     Hematology: Antiplatelets - clopidogrel  Failed - 01/04/2024  3:47 PM      Failed - PLT in normal range and within 180 days    Platelets  Date Value Ref Range Status  08/24/2023 122 (L) 150 - 450 x10E3/uL Final    Comment:    Actual platelet count may be somewhat higher than reported due to aggregation of platelets in this sample.          Failed - Cr in normal range and within 360 days    Creatinine  Date Value Ref Range Status  02/19/2013 0.86 0.60 - 1.30 mg/dL Final   Creatinine, Ser  Date Value Ref Range Status  08/24/2023 1.06 (H) 0.57 - 1.00 mg/dL Final         Passed - HCT in normal range and within 180 days    Hematocrit  Date Value Ref Range Status  08/24/2023 36.6 34.0 - 46.6 % Final         Passed - HGB in normal range and within 180 days    Hemoglobin  Date Value Ref Range Status  08/24/2023 11.6 11.1 - 15.9 g/dL Final         Passed - Valid encounter within last 6 months    Recent Outpatient Visits           2 months ago Vertigo   [place] [facility] [place], [place] P, DO   4 months ago Vertigo   [place] [facility] [place], [place], DO   4 months ago Vertigo   [place] [facility] [place], [place], DO   6 months ago Type 2 diabetes mellitus with complication, without long-term current use of insulin [facility])   [place] [facility] [place], [place], DO

## 2024-01-19 ENCOUNTER — Other Ambulatory Visit: Payer: Self-pay | Admitting: Family Medicine

## 2024-01-20 NOTE — Telephone Encounter (Signed)
 Requested Prescriptions  Pending Prescriptions Disp Refills   metoprolol  succinate (TOPROL -XL) 25 MG 24 hr tablet [Pharmacy Med Name: Metoprolol  Succinate ER 25 MG Oral Tablet Extended Release 24 Hour] 90 tablet 0    Sig: Take 1 tablet by mouth once daily     Cardiovascular:  Beta Blockers Failed - 01/20/2024  3:59 PM      Failed - Last BP in normal range    BP Readings from Last 1 Encounters:  10/07/23 (!) 160/64         Passed - Last Heart Rate in normal range    Pulse Readings from Last 1 Encounters:  10/07/23 (!) 49         Passed - Valid encounter within last 6 months    Recent Outpatient Visits           3 months ago Vertigo   [place] [facility] [place], [place], DO   4 months ago Vertigo   [place] [facility] [place], [place], DO   4 months ago Vertigo   [place] [facility] [place], [place] P, DO   6 months ago Type 2 diabetes mellitus with complication, without long-term current use of insulin [facility])   [place] [facility] [place], [place], DO

## 2024-01-22 ENCOUNTER — Other Ambulatory Visit: Payer: Self-pay | Admitting: Family Medicine

## 2024-01-25 ENCOUNTER — Ambulatory Visit (INDEPENDENT_AMBULATORY_CARE_PROVIDER_SITE_OTHER): Admitting: Family Medicine

## 2024-01-25 ENCOUNTER — Telehealth: Payer: Self-pay | Admitting: Family Medicine

## 2024-01-25 ENCOUNTER — Other Ambulatory Visit: Payer: Self-pay | Admitting: Family Medicine

## 2024-01-25 ENCOUNTER — Encounter: Payer: Self-pay | Admitting: Family Medicine

## 2024-01-25 VITALS — BP 140/62 | HR 61 | Temp 97.5°F | Ht 62.25 in | Wt 166.6 lb

## 2024-01-25 DIAGNOSIS — F331 Major depressive disorder, recurrent, moderate: Secondary | ICD-10-CM

## 2024-01-25 DIAGNOSIS — I25118 Atherosclerotic heart disease of native coronary artery with other forms of angina pectoris: Secondary | ICD-10-CM

## 2024-01-25 DIAGNOSIS — Z0000 Encounter for general adult medical examination without abnormal findings: Secondary | ICD-10-CM

## 2024-01-25 DIAGNOSIS — E118 Type 2 diabetes mellitus with unspecified complications: Secondary | ICD-10-CM

## 2024-01-25 DIAGNOSIS — E559 Vitamin D deficiency, unspecified: Secondary | ICD-10-CM

## 2024-01-25 DIAGNOSIS — I10 Essential (primary) hypertension: Secondary | ICD-10-CM | POA: Diagnosis not present

## 2024-01-25 DIAGNOSIS — R0789 Other chest pain: Secondary | ICD-10-CM | POA: Diagnosis not present

## 2024-01-25 DIAGNOSIS — I700 Atherosclerosis of aorta: Secondary | ICD-10-CM

## 2024-01-25 DIAGNOSIS — E782 Mixed hyperlipidemia: Secondary | ICD-10-CM

## 2024-01-25 DIAGNOSIS — G720 Drug-induced myopathy: Secondary | ICD-10-CM | POA: Diagnosis not present

## 2024-01-25 DIAGNOSIS — Z23 Encounter for immunization: Secondary | ICD-10-CM | POA: Diagnosis not present

## 2024-01-25 DIAGNOSIS — D692 Other nonthrombocytopenic purpura: Secondary | ICD-10-CM | POA: Diagnosis not present

## 2024-01-25 DIAGNOSIS — T466X5A Adverse effect of antihyperlipidemic and antiarteriosclerotic drugs, initial encounter: Secondary | ICD-10-CM | POA: Diagnosis not present

## 2024-01-25 LAB — MICROALBUMIN, URINE WAIVED
Creatinine, Urine Waived: 50 mg/dL (ref 10–300)
Microalb, Ur Waived: 30 mg/L — ABNORMAL HIGH (ref 0–19)

## 2024-01-25 LAB — BAYER DCA HB A1C WAIVED: HB A1C (BAYER DCA - WAIVED): 6.1 % — ABNORMAL HIGH (ref 4.8–5.6)

## 2024-01-25 MED ORDER — ISOSORBIDE MONONITRATE ER 60 MG PO TB24: 60.0000 mg | ORAL_TABLET | Freq: Every day | ORAL | 1 refills | Status: AC

## 2024-01-25 MED ORDER — HYDROCHLOROTHIAZIDE 25 MG PO TABS: 25.0000 mg | ORAL_TABLET | Freq: Every day | ORAL | 1 refills | Status: AC

## 2024-01-25 MED ORDER — CLOPIDOGREL BISULFATE 75 MG PO TABS: 75.0000 mg | ORAL_TABLET | Freq: Every day | ORAL | 1 refills | Status: AC

## 2024-01-25 MED ORDER — METOPROLOL SUCCINATE ER 25 MG PO TB24: 25.0000 mg | ORAL_TABLET | Freq: Every day | ORAL | 1 refills | Status: AC

## 2024-01-25 MED ORDER — TRAZODONE HCL 50 MG PO TABS: 50.0000 mg | ORAL_TABLET | Freq: Every day | ORAL | 1 refills | Status: AC

## 2024-01-25 MED ORDER — METFORMIN HCL 500 MG PO TABS: 500.0000 mg | ORAL_TABLET | Freq: Two times a day (BID) | ORAL | 1 refills | Status: DC

## 2024-01-25 MED ORDER — OMEPRAZOLE 40 MG PO CPDR: 40.0000 mg | DELAYED_RELEASE_CAPSULE | Freq: Every day | ORAL | 1 refills | Status: AC

## 2024-01-25 MED ORDER — LOSARTAN POTASSIUM 50 MG PO TABS: 50.0000 mg | ORAL_TABLET | Freq: Every day | ORAL | 1 refills | Status: DC

## 2024-01-25 MED ORDER — AMLODIPINE BESYLATE 2.5 MG PO TABS: 2.5000 mg | ORAL_TABLET | Freq: Every day | ORAL | 1 refills | Status: DC

## 2024-01-25 MED ORDER — FENOFIBRATE 145 MG PO TABS: 145.0000 mg | ORAL_TABLET | Freq: Every day | ORAL | 1 refills | Status: AC

## 2024-01-25 MED ORDER — PAROXETINE HCL 30 MG PO TABS: 60.0000 mg | ORAL_TABLET | Freq: Every day | ORAL | 1 refills | Status: AC

## 2024-01-25 NOTE — Assessment & Plan Note (Signed)
 Running a little high, but good at home. Will continue current regimen and continue to monitor- if consistently running high may need to increase amlodipine . Call with any concerns.

## 2024-01-25 NOTE — Assessment & Plan Note (Signed)
 Unable to tolerate statins. Continue fenofibrate . Rechecking labs today. Await results.

## 2024-01-25 NOTE — Telephone Encounter (Signed)
 Patient would like you to know that she no longer has a computer and would you send all results by mail.

## 2024-01-25 NOTE — Assessment & Plan Note (Signed)
 Under good control on current regimen. Continue current regimen. Continue to monitor. Call with any concerns. Refills given. Labs drawn today.

## 2024-01-25 NOTE — Telephone Encounter (Signed)
 Requested medications are due for refill today.  no  Requested medications are on the active medications list.  yes  Last refill. 01/25/2024  Future visit scheduled.   yes  Notes to clinic.  Pharmacy comment: Please clarify the directions  for this prescription. two sets of directions on rx.    Requested Prescriptions  Pending Prescriptions Disp Refills   metFORMIN  (GLUCOPHAGE ) 500 MG tablet [Pharmacy Med Name: METFORMIN  500MG  TAB] 270 tablet 1    Sig: TAKE 1 & 1/2 (ONE & ONE-HALF) TABLETS BY MOUTH TWICE DAILY WITH MEALS     Endocrinology:  Diabetes - Biguanides Failed - 01/25/2024  5:19 PM      Failed - Cr in normal range and within 360 days    Creatinine  Date Value Ref Range Status  02/19/2013 0.86 0.60 - 1.30 mg/dL Final   Creatinine, Ser  Date Value Ref Range Status  08/24/2023 1.06 (H) 0.57 - 1.00 mg/dL Final         Failed - eGFR in normal range and within 360 days    EGFR (African American)  Date Value Ref Range Status  02/19/2013 >60  Final   GFR calc Af Amer  Date Value Ref Range Status  04/23/2020 79 >59 mL/min/1.73 Final    Comment:    **In accordance with recommendations from the NKF-ASN Task force,**   [facility] is in the process of updating its eGFR calculation to the   2021 CKD-EPI creatinine equation that estimates kidney function   without a race variable.    EGFR (Non-African Amer.)  Date Value Ref Range Status  02/19/2013 >60  Final    Comment:    eGFR values <67mL/min/1.73 m2 may be an indication of chronic kidney disease (CKD). Calculated eGFR is useful in patients with stable renal function. The eGFR calculation will not be reliable in acutely ill patients when serum creatinine is changing rapidly. It is not useful in  patients on dialysis. The eGFR calculation may not be applicable to patients at the low and high extremes of body sizes, pregnant women, and vegetarians.    GFR, Estimated  Date Value Ref Range Status  09/15/2020 >60 >60  mL/min Final    Comment:    (NOTE) Calculated using the CKD-EPI Creatinine Equation (2021)    eGFR  Date Value Ref Range Status  08/24/2023 52 (L) >59 mL/min/1.73 Final         Failed - B12 Level in normal range and within 720 days    No results found for: VITAMINB12       Passed - HBA1C is between 0 and 7.9 and within 180 days    Hemoglobin A1C  Date Value Ref Range Status  05/03/2018 7.1  Final   HB A1C (BAYER DCA - WAIVED)  Date Value Ref Range Status  01/25/2024 6.1 (H) 4.8 - 5.6 % Final    Comment:             Prediabetes: 5.7 - 6.4          Diabetes: >6.4          Glycemic control for adults with diabetes: <7.0          Passed - Valid encounter within last 6 months    Recent Outpatient Visits           Today Routine general medical examination at a health care facility   [facility], [place] P, DO   3 months ago [facility], Megan P, DO   4 months ago [facility]   [facility] [place], Megan P, DO   5 months ago [facility]   [facility] [place], [place] P, DO   6 months ago Type 2 diabetes mellitus with complication, without long-term current use of insulin (HCC)    [facility] [place], Megan P, DO              Passed - CBC within normal limits and completed in the last 12 months    WBC  Date Value Ref Range Status  08/24/2023 5.3 3.4 - 10.8 x10E3/uL Final  09/15/2020 10.0 4.0 - 10.5 K/uL Final   RBC  Date Value Ref Range Status  08/24/2023 4.37 3.77 - 5.28 x10E6/uL Final  09/15/2020 4.46 3.87 - 5.11 MIL/uL Final   Hemoglobin  Date Value Ref Range Status  08/24/2023 11.6 11.1 - 15.9 g/dL Final   Hematocrit  Date Value Ref Range Status  08/24/2023 36.6 34.0 - 46.6 % Final   MCHC  Date Value Ref Range Status  08/24/2023 31.7 31.5 - 35.7 g/dL Final  95/75/7977 66.7 30.0 - 36.0 g/dL Final   [facility]  Date  Value Ref Range Status  08/24/2023 26.5 (L) 26.6 - 33.0 pg Final  09/15/2020 28.3 26.0 - 34.0 pg Final   MCV  Date Value Ref Range Status  08/24/2023 84 79 - 97 fL Final  02/19/2013 84 80 - 100 fL Final   No results found for: PLTCOUNTKUC, LABPLAT, POCPLA RDW  Date Value Ref Range Status  08/24/2023 13.5 11.7 - 15.4 % Final  02/19/2013 13.9 11.5 - 14.5 % Final

## 2024-01-25 NOTE — Assessment & Plan Note (Signed)
Will keep BP, cholesterol and sugars under good control. Continue to monitor. Call with any concerns.  

## 2024-01-25 NOTE — Telephone Encounter (Signed)
 Please deactivate her mychart

## 2024-01-25 NOTE — Assessment & Plan Note (Signed)
 Rechecking labs today. Await results. Treat as needed.

## 2024-01-25 NOTE — Assessment & Plan Note (Signed)
 Stable with A1c of 6.1 up slightly from 5.9. Continue diet and exercise. Recheck 3 months. Call with any concerns.

## 2024-01-25 NOTE — Assessment & Plan Note (Signed)
 Reassured patient. Continue to monitor. Call with any concerns.

## 2024-01-25 NOTE — Progress Notes (Unsigned)
 BP (!) 140/62   Pulse 61   Temp (!) 97.5 F (36.4 C) (Oral)   Ht 5' 2.25 (1.581 m)   Wt 166 lb 9.6 oz (75.6 kg)   SpO2 96%   BMI 30.23 kg/m    Subjective:    Patient ID: [name], female    DOB: [birth year], 83 y.o.   MRN: [id number]  HPI: Susan Allen is a 83 y.o. female presenting on 01/25/2024 for comprehensive medical examination. Current medical complaints include:  DIABETES Hypoglycemic episodes:no Polydipsia/polyuria: no Visual disturbance: no Chest pain: no Paresthesias: no Glucose Monitoring: no  Accucheck frequency: Not Checking  Fasting glucose:  Post prandial:  Evening:  Before meals: Taking Insulin?: no  Long acting insulin:  Short acting insulin: Blood Pressure Monitoring: not checking Retinal Examination: Up to Date Foot Exam: Up to Date Diabetic Education: Completed Pneumovax: Up to Date Influenza: Up to Date Aspirin: yes  HYPERTENSION / HYPERLIPIDEMIA Satisfied with current treatment? yes Duration of hypertension: chronic BP monitoring frequency: occasionally- 120s-130s systolic- see scanned document BP medication side effects: no Past BP meds: amlodipine , imdur , hydrochlorothiazide , metoprolol , losartan  Duration of hyperlipidemia: chronic Cholesterol medication side effects: yes- myalgias Cholesterol supplements: none Past cholesterol medications: fenofibrate  Medication compliance: excellent compliance Aspirin: no Recent stressors: no Recurrent headaches: no Visual changes: no Palpitations: no Dyspnea: no Chest pain: yes Lower extremity edema: yes Dizzy/lightheaded: no  CHEST PAIN Time since onset: yesterday, and again this AM, took a nitro Onset: sudden Quality: pressure Severity: moderate Location: substernal Radiation: none Episode duration: few minutes Frequency: 2x in 2 days Related to exertion: no Activity when pain started: had just gotten up Trauma: no Anxiety/recent stressors: no Status: better Treatments  attempted: nitro  Current pain status: pain free Shortness of breath: no Cough: no Nausea: no Diaphoresis: no Heartburn: no Palpitations: no   She currently lives with: husband Menopausal Symptoms: no  Depression Screen done today and results listed below:     01/25/2024    9:42 AM 10/07/2023    9:52 AM 08/31/2023    9:18 AM 07/06/2023    8:50 AM 05/21/2023   10:32 AM  Depression screen PHQ 2/9  Decreased Interest 0 0 2 2 3   Down, Depressed, Hopeless 0 0 2 1 3   PHQ - 2 Score 0 0 4 3 6   Altered sleeping 0 0 0 0 0  Tired, decreased energy 0 0 2 3 3   Change in appetite 0 0 2 0 3  Feeling bad or failure about yourself  0 0 1 0 3  Trouble concentrating 0 0 2 0 3  Moving slowly or fidgety/restless 0 0 1 0 3  Suicidal thoughts 0 0 0 0   PHQ-9 Score 0 0 12 6 21   Difficult doing work/chores Not difficult at all Not difficult at all  Not difficult at all     Past Medical History:  Past Medical History:  Diagnosis Date   Allergy    Arthritis    Bronchitis    Coronary artery disease    Depression    Diabetes mellitus without complication (HCC)    GERD (gastroesophageal reflux disease)    Headache    Hypertension    Myocardial infarction [facility]) June 2014, July 2014    Surgical History:  Past Surgical History:  Procedure Laterality Date   APPENDECTOMY     CATARACT EXTRACTION W/PHACO Right 04/22/2021   Procedure: CATARACT EXTRACTION PHACO AND INTRAOCULAR LENS PLACEMENT (IOC) RIGHT DIABETIC 23.38 02:10.8;  Surgeon: Jaye,  Elsie, MD;  Location: [facility];  Service: Ophthalmology;  Laterality: Right;  Diabetic   CATARACT EXTRACTION W/PHACO Left 06/17/2021   Procedure: CATARACT EXTRACTION PHACO AND INTRAOCULAR LENS PLACEMENT (IOC) LEFT DIABETIC 6.39 00:38.2;  Surgeon: Jaye Elsie, MD;  Location: [facility];  Service: Ophthalmology;  Laterality: Left;   CORONARY ANGIOPLASTY     with stents    DIGIT NAIL REMOVAL Bilateral 08/30/2015   Procedure: REMOVAL  OF DIGIT NAILS/ BIL. 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/30/2015   Procedure: EXCISION MORTON'S NEUROMA;  Surgeon: Donnice Cory, DPM;  Location: [facility] ORS;  Service: Podiatry;  Laterality: Right;   JOINT REPLACEMENT Bilateral    Total Knee Replacement, Dr Glendia Lawrence, [facility]   LEFT HEART CATH AND CORONARY ANGIOGRAPHY N/A 02/06/2021   Procedure: LEFT HEART CATH AND CORONARY ANGIOGRAPHY;  Surgeon: Lawyer Bernardino Donnice, MD;  Location: [facility] INVASIVE CV LAB;  Service: Cardiovascular;  Laterality: N/A;   TONSILLECTOMY     TOTAL ABDOMINAL HYSTERECTOMY      Medications:  Current Outpatient Medications on File Prior to Visit  Medication Sig   Blood Glucose Calibration (OT ULTRA/FASTTK CNTRL SOLN) SOLN See admin instructions.   Blood Glucose Monitoring Suppl (FREESTYLE LITE) DEVI USE TO CHECK GLUCOSE ONCE DAILY   Easy Comfort Lancets MISC 3 (three) times daily. for testing   FREESTYLE LITE test strip SMARTSIG:Strip(s) Via Meter Daily   Lancet Devices (EASY MINI EJECT LANCING DEVICE) MISC See admin instructions.   nitroGLYCERIN  (NITROSTAT ) 0.4 MG SL tablet Place 1 tablet (0.4 mg total) under the tongue every 5 (five) minutes as needed for chest pain.   Simethicone (GAS-X PO) Take 1 tablet by mouth daily as needed (flatulence).   diclofenac  Sodium (VOLTAREN ) 1 % GEL APPLY 4 GRAMS TOPICALLY 4 TIMES DAILY (Patient not taking: Reported on 01/25/2024)   EPINEPHrine  0.3 mg/0.3 mL IJ SOAJ injection See admin instructions. (Patient not taking: Reported on 01/25/2024)   triamcinolone  cream (KENALOG ) 0.1 % Apply 1 application  topically daily as needed (irritation). (Patient not taking: Reported on 01/25/2024)   No current facility-administered medications on file prior to visit.    Allergies:  Allergies  Allergen Reactions   Alpha-Gal Hives    Red meat    Aspirin Hives and Other (See Comments)    fainting   Lisinopril Cough   Statins Other (See Comments)    Leg cramps    Social History:  Social History   Socioeconomic History   Marital status: Married    Spouse name: Not on file   Number of children: Not on file   Years of education: Not on file   Highest education level: Not on file  Occupational History   Occupation: retired  Tobacco Use   Smoking status: Former    Current packs/day: 0.00    Types: Cigarettes    Quit date: 02/27/2004    Years since quitting: 19.9   Smokeless tobacco: Never  Vaping Use   Vaping status: Former  Substance and Sexual Activity   Alcohol use: No   Drug use: No   Sexual activity: Not Currently  Other Topics Concern   Not on file  Social History Narrative   Not on file   Social Drivers of Health   Financial Resource Strain: Low Risk  (01/25/2024)   Overall Financial Resource Strain (CARDIA)    Difficulty of Paying  Living Expenses: Not hard at all  Food Insecurity: No Food Insecurity (01/25/2024)   Hunger Vital Sign    Worried About Running Out of Food in the Last Year: Never true    Ran Out of Food in the Last Year: Never true  Transportation Needs: No Transportation Needs (01/25/2024)   PRAPARE - Administrator, Civil Service (Medical): No    Lack of Transportation (Non-Medical): No  Physical Activity: Inactive (01/25/2024)   Exercise Vital Sign    Days of Exercise per Week: 0 days    Minutes of Exercise per Session: 0 min  Stress: No Stress Concern Present (01/25/2024)   [employer] of Occupational Health - Occupational Stress Questionnaire    Feeling of Stress: Not at all  Social Connections: Socially Integrated (01/25/2024)   Social Connection and Isolation Panel    Frequency of Communication with Friends and Family: More than three times a week    Frequency of Social Gatherings with Friends and Family: Three times a week    Attends Religious Services: More than 4  times per year    Active Member of Clubs or Organizations: Yes    Attends Banker Meetings: More than 4 times per year    Marital Status: Married  Catering manager Violence: Not At Risk (01/25/2024)   Humiliation, Afraid, Rape, and Kick questionnaire    Fear of Current or Ex-Partner: No    Emotionally Abused: No    Physically Abused: No    Sexually Abused: No   Social History   Tobacco Use  Smoking Status Former   Current packs/day: 0.00   Types: Cigarettes   Quit date: 02/27/2004   Years since quitting: 19.9  Smokeless Tobacco Never   Social History   Substance and Sexual Activity  Alcohol Use No    Family History:  Family History  Problem Relation Age of Onset   Colon cancer Mother 82   Cancer Mother        colon   Cancer Father        lung   Cancer Sister        ovarian   Breast cancer Neg Hx     Past medical history, surgical history, medications, allergies, family history and social history reviewed with patient today and changes made to appropriate areas of the chart.   Review of Systems  Constitutional: Negative.   HENT:  Positive for sore throat. Negative for congestion, ear discharge, ear pain, hearing loss, nosebleeds, sinus pain and tinnitus.   Eyes: Negative.   Respiratory: Negative.  Negative for stridor.   Cardiovascular:  Positive for chest pain (starting yesterday PM) and leg swelling. Negative for palpitations, orthopnea, claudication and PND.  Gastrointestinal:  Positive for heartburn. Negative for abdominal pain, blood in stool, constipation, diarrhea, melena, nausea and vomiting.  Genitourinary: Negative.   Musculoskeletal: Negative.   Skin: Negative.   Neurological:  Positive for dizziness (with the chest pain yesterday- vertigo). Negative for tingling, tremors, sensory change, speech change, focal weakness, seizures, loss of consciousness, weakness and headaches.  Endo/Heme/Allergies:  Positive for polydipsia. Negative for  environmental allergies. Bruises/bleeds easily.  Psychiatric/Behavioral: Negative.     All other ROS negative except what is listed above and in the HPI.      Objective:    BP (!) 140/62   Pulse 61   Temp (!) 97.5 F (36.4 C) (Oral)   Ht 5' 2.25 (1.581 m)   Wt 166 lb 9.6 oz (75.6 kg)  SpO2 96%   BMI 30.23 kg/m   Wt Readings from Last 3 Encounters:  01/25/24 166 lb 9.6 oz (75.6 kg)  10/07/23 164 lb 3.2 oz (74.5 kg)  08/24/23 152 lb (68.9 kg)    Physical Exam Vitals and nursing note reviewed.  Constitutional:      General: She is not in acute distress.    Appearance: Normal appearance. She is not ill-appearing, toxic-appearing or diaphoretic.  HENT:     Head: Normocephalic and atraumatic.     Right Ear: Tympanic membrane, ear canal and external ear normal. There is no impacted cerumen.     Left Ear: Tympanic membrane, ear canal and external ear normal. There is no impacted cerumen.     Nose: Nose normal. No congestion or rhinorrhea.     Mouth/Throat:     Mouth: Mucous membranes are moist.     Pharynx: Oropharynx is clear. No oropharyngeal exudate or posterior oropharyngeal erythema.  Eyes:     General: No scleral icterus.       Right eye: No discharge.        Left eye: No discharge.     Extraocular Movements: Extraocular movements intact.     Conjunctiva/sclera: Conjunctivae normal.     Pupils: Pupils are equal, round, and reactive to light.  Neck:     Vascular: No carotid bruit.  Cardiovascular:     Rate and Rhythm: Normal rate and regular rhythm.     Pulses: Normal pulses.     Heart sounds: No murmur heard.    No friction rub. No gallop.  Pulmonary:     Effort: Pulmonary effort is normal. No respiratory distress.     Breath sounds: Normal breath sounds. No stridor. No wheezing, rhonchi or rales.  Chest:     Chest wall: No tenderness.  Abdominal:     General: Abdomen is flat. Bowel sounds are normal. There is no distension.     Palpations: Abdomen is soft.  There is no mass.     Tenderness: There is no abdominal tenderness. There is no right CVA tenderness, left CVA tenderness, guarding or rebound.     Hernia: No hernia is present.  Genitourinary:    Comments: Breast and pelvic exams deferred with shared decision making Musculoskeletal:        General: No swelling, tenderness, deformity or signs of injury.     Cervical back: Normal range of motion and neck supple. No rigidity. No muscular tenderness.     Right lower leg: No edema.     Left lower leg: No edema.  Lymphadenopathy:     Cervical: No cervical adenopathy.  Skin:    General: Skin is warm and dry.     Capillary Refill: Capillary refill takes less than 2 seconds.     Coloration: Skin is not jaundiced or pale.     Findings: No bruising, erythema, lesion or rash.  Neurological:     General: No focal deficit present.     Mental Status: She is alert and oriented to person, place, and time. Mental status is at baseline.     Cranial Nerves: No cranial nerve deficit.     Sensory: No sensory deficit.     Motor: No weakness.     Coordination: Coordination normal.     Gait: Gait normal.     Deep Tendon Reflexes: Reflexes normal.  Psychiatric:        Mood and Affect: Mood normal.        Behavior: Behavior normal.  Thought Content: Thought content normal.        Judgment: Judgment normal.     Results for orders placed or performed in visit on 01/25/24  Bayer DCA Hb A1c Waived   Collection Time: 01/25/24  9:42 AM  Result Value Ref Range   HB A1C (BAYER DCA - WAIVED) 6.1 (H) 4.8 - 5.6 %  Microalbumin, Urine Waived   Collection Time: 01/25/24  9:42 AM  Result Value Ref Range   Microalb, Ur Waived 30 (H) 0 - 19 mg/L   Creatinine, Urine Waived 50 10 - 300 mg/dL   Microalb/Creat Ratio 30-300 (H) <30 mg/g      Assessment & Plan:   Problem List Items Addressed This Visit       Cardiovascular and Mediastinum   Essential hypertension   Running a little high, but good at  home. Will continue current regimen and continue to monitor- if consistently running high may need to increase amlodipine . Call with any concerns.       Relevant Medications   amLODipine  (NORVASC ) 2.5 MG tablet   fenofibrate  (TRICOR ) 145 MG tablet   hydrochlorothiazide  (HYDRODIURIL ) 25 MG tablet   isosorbide  mononitrate (IMDUR ) 60 MG 24 hr tablet   losartan  (COZAAR ) 50 MG tablet   metoprolol  succinate (TOPROL -XL) 25 MG 24 hr tablet   Other Relevant Orders   CBC with Differential/Platelet   Comprehensive metabolic panel with GFR   TSH   Coronary artery disease of native artery of native heart with stable angina pectoris (HCC)   Will keep BP and cholesterol and sugars under good control. Continue to monitor. Follow up with Dr. Florencio as scheduled- call if CP comes back.       Relevant Medications   amLODipine  (NORVASC ) 2.5 MG tablet   fenofibrate  (TRICOR ) 145 MG tablet   hydrochlorothiazide  (HYDRODIURIL ) 25 MG tablet   isosorbide  mononitrate (IMDUR ) 60 MG 24 hr tablet   losartan  (COZAAR ) 50 MG tablet   metoprolol  succinate (TOPROL -XL) 25 MG 24 hr tablet   PARoxetine  (PAXIL ) 30 MG tablet   traZODone  (DESYREL ) 50 MG tablet   Other Relevant Orders   CBC with Differential/Platelet   Comprehensive metabolic panel with GFR   Senile purpura (HCC)   Reassured patient. Continue to monitor. Call with any concerns.       Relevant Medications   amLODipine  (NORVASC ) 2.5 MG tablet   fenofibrate  (TRICOR ) 145 MG tablet   hydrochlorothiazide  (HYDRODIURIL ) 25 MG tablet   isosorbide  mononitrate (IMDUR ) 60 MG 24 hr tablet   losartan  (COZAAR ) 50 MG tablet   metoprolol  succinate (TOPROL -XL) 25 MG 24 hr tablet   Other Relevant Orders   CBC with Differential/Platelet   Comprehensive metabolic panel with GFR   Aortic atherosclerosis (HCC)   Will keep BP, cholesterol and sugars under good control. Continue to monitor. Call with any concerns.       Relevant Medications   amLODipine  (NORVASC )  2.5 MG tablet   fenofibrate  (TRICOR ) 145 MG tablet   hydrochlorothiazide  (HYDRODIURIL ) 25 MG tablet   isosorbide  mononitrate (IMDUR ) 60 MG 24 hr tablet   losartan  (COZAAR ) 50 MG tablet   metoprolol  succinate (TOPROL -XL) 25 MG 24 hr tablet   Other Relevant Orders   CBC with Differential/Platelet   Comprehensive metabolic panel with GFR   Lipid Panel w/o Chol/HDL Ratio     Endocrine   Type 2 diabetes mellitus with complication, without long-term current use of insulin (HCC)   Stable with A1c of 6.1 up slightly from 5.9. Continue  diet and exercise. Recheck 3 months. Call with any concerns.       Relevant Medications   losartan  (COZAAR ) 50 MG tablet   metFORMIN  (GLUCOPHAGE ) 500 MG tablet   Other Relevant Orders   Bayer DCA Hb A1c Waived (Completed)   Microalbumin, Urine Waived (Completed)   CBC with Differential/Platelet   Comprehensive metabolic panel with GFR   Lipid Panel w/o Chol/HDL Ratio     Musculoskeletal and Integument   Statin myopathy   Unable to tolerate statins. Continue fenofibrate . Rechecking labs today. Await results.       Relevant Orders   CBC with Differential/Platelet   Comprehensive metabolic panel with GFR   Lipid Panel w/o Chol/HDL Ratio     Other   Hyperlipidemia   Under good control on current regimen. Continue current regimen. Continue to monitor. Call with any concerns. Refills given. Labs drawn today.       Relevant Medications   amLODipine  (NORVASC ) 2.5 MG tablet   fenofibrate  (TRICOR ) 145 MG tablet   hydrochlorothiazide  (HYDRODIURIL ) 25 MG tablet   isosorbide  mononitrate (IMDUR ) 60 MG 24 hr tablet   losartan  (COZAAR ) 50 MG tablet   metoprolol  succinate (TOPROL -XL) 25 MG 24 hr tablet   Moderate episode of recurrent major depressive disorder (HCC)   Under good control on current regimen. Continue current regimen. Continue to monitor. Call with any concerns. Refills given. Labs drawn today.        Relevant Medications   PARoxetine  (PAXIL )  30 MG tablet   traZODone  (DESYREL ) 50 MG tablet   Vitamin D  deficiency   Rechecking labs today. Await results. Treat as needed.       Relevant Orders   CBC with Differential/Platelet   Comprehensive metabolic panel with GFR   VITAMIN D  25 Hydroxy (Vit-D Deficiency, Fractures)   Other Visit Diagnoses       Routine general medical examination at a health care facility    -  Primary   Vaccines up to date. Screening labs checked today. DEXA up to date. Continue diet and exercise. Call with any concerns.     Other chest pain       EKG normal. Warning signs to go to ER discussed- if happens again will call Dr. Florencio.   Relevant Orders   EKG 12-Lead     Needs flu shot       Flu shot given today.   Relevant Orders   Flu vaccine HIGH DOSE PF(Fluzone Trivalent)        Follow up plan: Return in about 3 months (around 04/25/2024).   LABORATORY TESTING:  - Pap smear: not applicable  IMMUNIZATIONS:   - Tdap: Tetanus vaccination status reviewed: last tetanus booster within 10 years. - Influenza: Administered today - Pneumovax: Up to date - Prevnar: Up to date - COVID: Refused - HPV: Not applicable - Shingrix vaccine: Given elsewhere  SCREENING: -Mammogram: Not applicable  - Colonoscopy: Not applicable  - Bone Density: Up to date   PATIENT COUNSELING:   Advised to take 1 mg of folate supplement per day if capable of pregnancy.   Sexuality: Discussed sexually transmitted diseases, partner selection, use of condoms, avoidance of unintended pregnancy  and contraceptive alternatives.   Advised to avoid cigarette smoking.  I discussed with the patient that most people either abstain from alcohol or drink within safe limits (<=14/week and <=4 drinks/occasion for males, <=7/weeks and <= 3 drinks/occasion for females) and that the risk for alcohol disorders and other health effects rises proportionally  with the number of drinks per week and how often a drinker exceeds daily  limits.  Discussed cessation/primary prevention of drug use and availability of treatment for abuse.   Diet: Encouraged to adjust caloric intake to maintain  or achieve ideal body weight, to reduce intake of dietary saturated fat and total fat, to limit sodium intake by avoiding high sodium foods and not adding table salt, and to maintain adequate dietary potassium and calcium preferably from fresh fruits, vegetables, and low-fat dairy products.    stressed the importance of regular exercise  Injury prevention: Discussed safety belts, safety helmets, smoke detector, smoking near bedding or upholstery.   Dental health: Discussed importance of regular tooth brushing, flossing, and dental visits.    NEXT PREVENTATIVE PHYSICAL DUE IN 1 YEAR. Return in about 3 months (around 04/25/2024).

## 2024-01-25 NOTE — Assessment & Plan Note (Signed)
 Will keep BP and cholesterol and sugars under good control. Continue to monitor. Follow up with Dr. Florencio as scheduled- call if CP comes back.

## 2024-01-25 NOTE — Telephone Encounter (Signed)
Deactivated

## 2024-01-26 LAB — VITAMIN D 25 HYDROXY (VIT D DEFICIENCY, FRACTURES): Vit D, 25-Hydroxy: 27.4 ng/mL — ABNORMAL LOW (ref 30.0–100.0)

## 2024-01-26 LAB — COMPREHENSIVE METABOLIC PANEL WITH GFR
ALT: 8 IU/L (ref 0–32)
AST: 16 IU/L (ref 0–40)
Albumin: 4.2 g/dL (ref 3.7–4.7)
Alkaline Phosphatase: 43 IU/L — ABNORMAL LOW (ref 44–121)
BUN/Creatinine Ratio: 25 (ref 12–28)
BUN: 22 mg/dL (ref 8–27)
Bilirubin Total: 0.3 mg/dL (ref 0.0–1.2)
CO2: 24 mmol/L (ref 20–29)
Calcium: 9.3 mg/dL (ref 8.7–10.3)
Chloride: 94 mmol/L — ABNORMAL LOW (ref 96–106)
Creatinine, Ser: 0.89 mg/dL (ref 0.57–1.00)
Globulin, Total: 2 g/dL (ref 1.5–4.5)
Glucose: 100 mg/dL — ABNORMAL HIGH (ref 70–99)
Potassium: 4.4 mmol/L (ref 3.5–5.2)
Sodium: 134 mmol/L (ref 134–144)
Total Protein: 6.2 g/dL (ref 6.0–8.5)
eGFR: 65 mL/min/1.73

## 2024-01-26 LAB — CBC WITH DIFFERENTIAL/PLATELET
Basophils Absolute: 0.1 x10E3/uL (ref 0.0–0.2)
Basos: 1 %
EOS (ABSOLUTE): 0.4 x10E3/uL (ref 0.0–0.4)
Eos: 9 %
Hematocrit: 35.7 % (ref 34.0–46.6)
Hemoglobin: 10.9 g/dL — ABNORMAL LOW (ref 11.1–15.9)
Immature Grans (Abs): 0 x10E3/uL (ref 0.0–0.1)
Immature Granulocytes: 0 %
Lymphocytes Absolute: 1.7 x10E3/uL (ref 0.7–3.1)
Lymphs: 34 %
MCH: 26.3 pg — ABNORMAL LOW (ref 26.6–33.0)
MCHC: 30.5 g/dL — ABNORMAL LOW (ref 31.5–35.7)
MCV: 86 fL (ref 79–97)
Monocytes Absolute: 0.7 x10E3/uL (ref 0.1–0.9)
Monocytes: 13 %
Neutrophils Absolute: 2.2 x10E3/uL (ref 1.4–7.0)
Neutrophils: 43 %
Platelets: 75 x10E3/uL — CL (ref 150–450)
RBC: 4.15 x10E6/uL (ref 3.77–5.28)
RDW: 13.7 % (ref 11.7–15.4)
WBC: 5 x10E3/uL (ref 3.4–10.8)

## 2024-01-26 LAB — LIPID PANEL W/O CHOL/HDL RATIO
Cholesterol, Total: 200 mg/dL — ABNORMAL HIGH (ref 100–199)
HDL: 61 mg/dL (ref >39)
LDL Chol Calc (NIH): 120 mg/dL — ABNORMAL HIGH (ref 0–99)
Triglycerides: 104 mg/dL (ref 0–149)
VLDL Cholesterol Cal: 19 mg/dL (ref 5–40)

## 2024-01-26 LAB — TSH: TSH: 3.2 u[IU]/mL (ref 0.450–4.500)

## 2024-01-30 ENCOUNTER — Other Ambulatory Visit: Payer: Self-pay | Admitting: Family Medicine

## 2024-01-30 ENCOUNTER — Ambulatory Visit: Payer: Self-pay | Admitting: Family Medicine

## 2024-01-30 DIAGNOSIS — D649 Anemia, unspecified: Secondary | ICD-10-CM

## 2024-01-31 DIAGNOSIS — E119 Type 2 diabetes mellitus without complications: Secondary | ICD-10-CM | POA: Diagnosis not present

## 2024-01-31 DIAGNOSIS — H353131 Nonexudative age-related macular degeneration, bilateral, early dry stage: Secondary | ICD-10-CM | POA: Diagnosis not present

## 2024-01-31 DIAGNOSIS — H40003 Preglaucoma, unspecified, bilateral: Secondary | ICD-10-CM | POA: Diagnosis not present

## 2024-01-31 DIAGNOSIS — H26493 Other secondary cataract, bilateral: Secondary | ICD-10-CM | POA: Diagnosis not present

## 2024-01-31 LAB — HM DIABETES EYE EXAM

## 2024-01-31 NOTE — Telephone Encounter (Signed)
 Requested medications are due for refill today.  No - see note  Requested medications are on the active medications list.  yes  Last refill. 12/25/2023  Future visit scheduled.   yes  Notes to clinic.  Pharmacy comment: Please clarify the directions  for this prescription. rx says 1 twice daily and then it says 1.5 twice daily...which sig do we use.     Requested Prescriptions  Pending Prescriptions Disp Refills   metFORMIN  (GLUCOPHAGE ) 500 MG tablet [Pharmacy Med Name: METFORMIN  500MG  TAB] 270 tablet 1    Sig: TAKE 1 & 1/2 (ONE & ONE-HALF) TABLETS BY MOUTH TWICE DAILY WITH MEALS     Endocrinology:  Diabetes - Biguanides Failed - 01/31/2024  5:40 PM      Failed - B12 Level in normal range and within 720 days    No results found for: VITAMINB12       Failed - CBC within normal limits and completed in the last 12 months    WBC  Date Value Ref Range Status  01/25/2024 5.0 3.4 - 10.8 x10E3/uL Final  09/15/2020 10.0 4.0 - 10.5 K/uL Final   RBC  Date Value Ref Range Status  01/25/2024 4.15 3.77 - 5.28 x10E6/uL Final    Comment:    Elliptocytes present.  09/15/2020 4.46 3.87 - 5.11 MIL/uL Final   Hemoglobin  Date Value Ref Range Status  01/25/2024 10.9 (L) 11.1 - 15.9 g/dL Final   Hematocrit  Date Value Ref Range Status  01/25/2024 35.7 34.0 - 46.6 % Final   MCHC  Date Value Ref Range Status  01/25/2024 30.5 (L) 31.5 - 35.7 g/dL Final  95/75/7977 66.7 30.0 - 36.0 g/dL Final   [facility]  Date Value Ref Range Status  01/25/2024 26.3 (L) 26.6 - 33.0 pg Final  09/15/2020 28.3 26.0 - 34.0 pg Final   MCV  Date Value Ref Range Status  01/25/2024 86 79 - 97 fL Final  02/19/2013 84 80 - 100 fL Final   No results found for: PLTCOUNTKUC, LABPLAT, POCPLA RDW  Date Value Ref Range Status  01/25/2024 13.7 11.7 - 15.4 % Final  02/19/2013 13.9 11.5 - 14.5 % Final         Passed - Cr in normal range and within 360 days    Creatinine  Date Value Ref Range Status  02/19/2013  0.86 0.60 - 1.30 mg/dL Final   Creatinine, Ser  Date Value Ref Range Status  01/25/2024 0.89 0.57 - 1.00 mg/dL Final         Passed - HBA1C is between 0 and 7.9 and within 180 days    Hemoglobin A1C  Date Value Ref Range Status  05/03/2018 7.1  Final   HB A1C (BAYER DCA - WAIVED)  Date Value Ref Range Status  01/25/2024 6.1 (H) 4.8 - 5.6 % Final    Comment:             Prediabetes: 5.7 - 6.4          Diabetes: >6.4          Glycemic control for adults with diabetes: <7.0          Passed - eGFR in normal range and within 360 days    EGFR (African American)  Date Value Ref Range Status  02/19/2013 >60  Final   GFR calc Af Amer  Date Value Ref Range Status  04/23/2020 79 >59 mL/min/1.73 Final    Comment:    **In accordance with recommendations from the  NKF-ASN Task force,**   [facility] is in the process of updating its eGFR calculation to the   2021 CKD-EPI creatinine equation that estimates kidney function   without a race variable.    EGFR (Non-African Amer.)  Date Value Ref Range Status  02/19/2013 >60  Final    Comment:    eGFR values <74mL/min/1.73 m2 may be an indication of chronic kidney disease (CKD). Calculated eGFR is useful in patients with stable renal function. The eGFR calculation will not be reliable in acutely ill patients when serum creatinine is changing rapidly. It is not useful in  patients on dialysis. The eGFR calculation may not be applicable to patients at the low and high extremes of body sizes, pregnant women, and vegetarians.    GFR, Estimated  Date Value Ref Range Status  09/15/2020 >60 >60 mL/min Final    Comment:    (NOTE) Calculated using the CKD-EPI Creatinine Equation (2021)    eGFR  Date Value Ref Range Status  01/25/2024 65 >59 mL/min/1.73 Final         Passed - Valid encounter within last 6 months    Recent Outpatient Visits           6 days ago Routine general medical examination at a health care facility   [facility] [place], Megan P, DO   3 months ago [facility]   [place] [facility] [place], Megan P, DO   5 months ago [facility]   [place] [facility] [place], [place], DO   5 months ago [facility]   [place] [facility] [place], [place] P, DO   6 months ago Type 2 diabetes mellitus with complication, without long-term current use of insulin [facility])    [facility] [place], [place], DO

## 2024-02-01 NOTE — Telephone Encounter (Signed)
 Routing to provider to clarify directions.

## 2024-02-03 ENCOUNTER — Other Ambulatory Visit

## 2024-02-03 DIAGNOSIS — D649 Anemia, unspecified: Secondary | ICD-10-CM | POA: Diagnosis not present

## 2024-02-04 LAB — CBC WITH DIFFERENTIAL/PLATELET
Basophils Absolute: 0.1 x10E3/uL (ref 0.0–0.2)
Basos: 1 %
EOS (ABSOLUTE): 0.4 x10E3/uL (ref 0.0–0.4)
Eos: 7 %
Hematocrit: 34.5 % (ref 34.0–46.6)
Hemoglobin: 11 g/dL — ABNORMAL LOW (ref 11.1–15.9)
Immature Grans (Abs): 0 x10E3/uL (ref 0.0–0.1)
Immature Granulocytes: 0 %
Lymphocytes Absolute: 1.6 x10E3/uL (ref 0.7–3.1)
Lymphs: 29 %
MCH: 27.2 pg (ref 26.6–33.0)
MCHC: 31.9 g/dL (ref 31.5–35.7)
MCV: 85 fL (ref 79–97)
Monocytes Absolute: 0.7 x10E3/uL (ref 0.1–0.9)
Monocytes: 12 %
Neutrophils Absolute: 2.8 x10E3/uL (ref 1.4–7.0)
Neutrophils: 51 %
RBC: 4.05 x10E6/uL (ref 3.77–5.28)
RDW: 13.6 % (ref 11.7–15.4)
WBC: 5.4 x10E3/uL (ref 3.4–10.8)

## 2024-02-04 LAB — IRON AND TIBC
Iron Saturation: 12 % — ABNORMAL LOW (ref 15–55)
Iron: 57 ug/dL (ref 27–139)
Total Iron Binding Capacity: 470 ug/dL — ABNORMAL HIGH (ref 250–450)
UIBC: 413 ug/dL — ABNORMAL HIGH (ref 118–369)

## 2024-02-04 LAB — FERRITIN: Ferritin: 21 ng/mL (ref 15–150)

## 2024-02-04 LAB — VITAMIN B12: Vitamin B-12: 160 pg/mL — ABNORMAL LOW (ref 232–1245)

## 2024-02-07 ENCOUNTER — Encounter: Payer: Self-pay | Admitting: Family Medicine

## 2024-02-09 ENCOUNTER — Ambulatory Visit: Payer: Self-pay | Admitting: Family Medicine

## 2024-03-01 DIAGNOSIS — E782 Mixed hyperlipidemia: Secondary | ICD-10-CM | POA: Diagnosis not present

## 2024-03-01 DIAGNOSIS — R42 Dizziness and giddiness: Secondary | ICD-10-CM | POA: Diagnosis not present

## 2024-03-01 DIAGNOSIS — I700 Atherosclerosis of aorta: Secondary | ICD-10-CM | POA: Diagnosis not present

## 2024-03-01 DIAGNOSIS — I10 Essential (primary) hypertension: Secondary | ICD-10-CM | POA: Diagnosis not present

## 2024-03-01 DIAGNOSIS — I2510 Atherosclerotic heart disease of native coronary artery without angina pectoris: Secondary | ICD-10-CM | POA: Diagnosis not present

## 2024-03-01 DIAGNOSIS — K219 Gastro-esophageal reflux disease without esophagitis: Secondary | ICD-10-CM | POA: Diagnosis not present

## 2024-03-01 DIAGNOSIS — E118 Type 2 diabetes mellitus with unspecified complications: Secondary | ICD-10-CM | POA: Diagnosis not present

## 2024-03-30 ENCOUNTER — Other Ambulatory Visit: Payer: Self-pay | Admitting: Family Medicine

## 2024-03-31 NOTE — Telephone Encounter (Signed)
 Not current dosing- current Rx states 1 tablet daily Requested Prescriptions  Pending Prescriptions Disp Refills   amLODipine  (NORVASC ) 2.5 MG tablet [Pharmacy Med Name: amLODIPine  Besylate 2.5 MG Oral Tablet] 45 tablet 0    Sig: Take 1/2 (one-half) tablet by mouth once daily     Cardiovascular: Calcium Channel Blockers 2 Failed - 03/31/2024 12:59 PM      Failed - Last BP in normal range    BP Readings from Last 1 Encounters:  01/25/24 (!) 140/62         Passed - Last Heart Rate in normal range    Pulse Readings from Last 1 Encounters:  01/25/24 61         Passed - Valid encounter within last 6 months    Recent Outpatient Visits           2 months ago Routine general medical examination at a health care facility   [facility] [employer], Megan P, DO   5 months ago [facility]   [place] [facility] [place], [place], DO   7 months ago [facility]   [place] [facility] [place], [place], DO   7 months ago [facility]   [place] [facility] [place], [place] P, DO   8 months ago Type 2 diabetes mellitus with complication, without long-term current use of insulin [facility])   [place] [facility] [place], [place], DO

## 2024-04-25 ENCOUNTER — Encounter: Payer: Self-pay | Admitting: Family Medicine

## 2024-04-25 ENCOUNTER — Ambulatory Visit: Admitting: Family Medicine

## 2024-04-25 VITALS — BP 168/80 | HR 56 | Temp 98.0°F | Ht 62.0 in | Wt 168.0 lb

## 2024-04-25 DIAGNOSIS — E118 Type 2 diabetes mellitus with unspecified complications: Secondary | ICD-10-CM

## 2024-04-25 DIAGNOSIS — E1129 Type 2 diabetes mellitus with other diabetic kidney complication: Secondary | ICD-10-CM | POA: Diagnosis not present

## 2024-04-25 DIAGNOSIS — M5431 Sciatica, right side: Secondary | ICD-10-CM

## 2024-04-25 DIAGNOSIS — R809 Proteinuria, unspecified: Secondary | ICD-10-CM | POA: Diagnosis not present

## 2024-04-25 DIAGNOSIS — I10 Essential (primary) hypertension: Secondary | ICD-10-CM

## 2024-04-25 DIAGNOSIS — D649 Anemia, unspecified: Secondary | ICD-10-CM | POA: Diagnosis not present

## 2024-04-25 DIAGNOSIS — Z7984 Long term (current) use of oral hypoglycemic drugs: Secondary | ICD-10-CM | POA: Diagnosis not present

## 2024-04-25 LAB — BAYER DCA HB A1C WAIVED: HB A1C (BAYER DCA - WAIVED): 6.1 % — ABNORMAL HIGH (ref 4.8–5.6)

## 2024-04-25 MED ORDER — GABAPENTIN 300 MG PO CAPS: 300.0000 mg | ORAL_CAPSULE | Freq: Three times a day (TID) | ORAL | 3 refills | Status: AC

## 2024-04-25 MED ORDER — LOSARTAN POTASSIUM 100 MG PO TABS: 100.0000 mg | ORAL_TABLET | Freq: Every day | ORAL | 0 refills | Status: AC

## 2024-04-25 NOTE — Assessment & Plan Note (Signed)
 Not under good control. Will increase her losartan  to 100mg  and recheck in 6 weeks. Call with any concerns.

## 2024-04-25 NOTE — Progress Notes (Signed)
 BP (!) 168/80   Pulse (!) 56   Temp 98 F (36.7 C) (Oral)   Ht 5' 2 (1.575 m)   Wt 168 lb (76.2 kg)   SpO2 97%   BMI 30.73 kg/m    Subjective:    Patient ID: Susan Allen, female    DOB: 10/11/1940, 83 y.o.   MRN: [id number]  HPI: Susan Allen is a 83 y.o. female  Chief Complaint  Patient presents with   Diabetes    BGL 04/24/24: 108 fasting 04/25/24 130 non fasting   Hypertension    Bp AVG @ home 160s/80s   DIABETES Hypoglycemic episodes:no Polydipsia/polyuria: no Visual disturbance: no Chest pain: no Paresthesias: no Glucose Monitoring: yes  Accucheck frequency: Daily Taking Insulin?: no Blood Pressure Monitoring: not checking Retinal Examination: Up to Date Foot Exam: Up to Date Diabetic Education: Completed Pneumovax: Up to Date Influenza: Up to Date Aspirin: yes  HYPERTENSION  Hypertension status: uncontrolled  Satisfied with current treatment? no Duration of hypertension: chronic BP monitoring frequency:  a few times a week BP medication side effects:  no Medication compliance: excellent compliance Previous BP meds: losartan , hydrochlorothiazide , imdur , amlodipine  Aspirin: yes Recurrent headaches: no Visual changes: no Palpitations: no Dyspnea: no Chest pain: no Lower extremity edema: no Dizzy/lightheaded: no  BACK PAIN Duration: months Mechanism of injury: unknown Location: R buttock and leg Onset: sudden Severity: moderate Quality: shooting and aching Frequency: intermittent Radiation: R leg Aggravating factors: movement, lifting Alleviating factors: PT Status: stable Treatments attempted: gabapentin , rest, ice, heat, APAP, ibuprofen, aleve, physical therapy, and HEP  Relief with NSAIDs?: mild Nighttime pain:  no Paresthesias / decreased sensation:  yes Bowel / bladder incontinence:  no Fevers:  no Dysuria / urinary frequency:  no  Relevant past medical, surgical, family and social history reviewed and updated as indicated. Interim  medical history since our last visit reviewed. Allergies and medications reviewed and updated.  Review of Systems  Constitutional: Negative.   Respiratory: Negative.    Cardiovascular: Negative.   Musculoskeletal:  Positive for back pain and myalgias. Negative for arthralgias, gait problem, joint swelling, neck pain and neck stiffness.  Skin: Negative.   Psychiatric/Behavioral:  Positive for dysphoric mood. Negative for agitation, behavioral problems, confusion, decreased concentration, hallucinations, self-injury, sleep disturbance and suicidal ideas. The patient is not nervous/anxious and is not hyperactive.     Per HPI unless specifically indicated above     Objective:    BP (!) 168/80   Pulse (!) 56   Temp 98 F (36.7 C) (Oral)   Ht 5' 2 (1.575 m)   Wt 168 lb (76.2 kg)   SpO2 97%   BMI 30.73 kg/m   Wt Readings from Last 3 Encounters:  04/25/24 168 lb (76.2 kg)  01/25/24 166 lb 9.6 oz (75.6 kg)  10/07/23 164 lb 3.2 oz (74.5 kg)    Physical Exam Vitals and nursing note reviewed.  Constitutional:      General: She is not in acute distress.    Appearance: Normal appearance. She is not ill-appearing, toxic-appearing or diaphoretic.  HENT:     Head: Normocephalic and atraumatic.     Right Ear: External ear normal.     Left Ear: External ear normal.     Nose: Nose normal.     Mouth/Throat:     Mouth: Mucous membranes are moist.     Pharynx: Oropharynx is clear.  Eyes:     General: No scleral icterus.  Right eye: No discharge.        Left eye: No discharge.     Extraocular Movements: Extraocular movements intact.     Conjunctiva/sclera: Conjunctivae normal.     Pupils: Pupils are equal, round, and reactive to light.  Cardiovascular:     Rate and Rhythm: Normal rate and regular rhythm.     Pulses: Normal pulses.     Heart sounds: Normal heart sounds. No murmur heard.    No friction rub. No gallop.  Pulmonary:     Effort: Pulmonary effort is normal. No  respiratory distress.     Breath sounds: Normal breath sounds. No stridor. No wheezing, rhonchi or rales.  Chest:     Chest wall: No tenderness.  Musculoskeletal:        General: Normal range of motion.     Cervical back: Normal range of motion and neck supple.  Skin:    General: Skin is warm and dry.     Capillary Refill: Capillary refill takes less than 2 seconds.     Coloration: Skin is not jaundiced or pale.     Findings: No bruising, erythema, lesion or rash.  Neurological:     General: No focal deficit present.     Mental Status: She is alert and oriented to person, place, and time. Mental status is at baseline.  Psychiatric:        Mood and Affect: Mood normal.        Behavior: Behavior normal.        Thought Content: Thought content normal.        Judgment: Judgment normal.     Results for orders placed or performed in visit on 02/07/24  HM DIABETES EYE EXAM   Collection Time: 01/31/24 12:03 PM  Result Value Ref Range   HM Diabetic Eye Exam No Retinopathy No Retinopathy      Assessment & Plan:   Problem List Items Addressed This Visit       Cardiovascular and Mediastinum   Essential hypertension   Not under good control. Will increase her losartan  to 100mg  and recheck in 6 weeks. Call with any concerns.       Relevant Medications   losartan  (COZAAR ) 100 MG tablet     Endocrine   Type 2 diabetes mellitus with complication, without long-term current use of insulin (HCC) - Primary   Doing great with A1c 6.1. Continue current regimen. Call with any concerns.       Relevant Medications   losartan  (COZAAR ) 100 MG tablet   Other Relevant Orders   Bayer DCA Hb A1c Waived   Other Visit Diagnoses       Anemia, unspecified type       Rechecking labs today. Await results. Treat as needed.   Relevant Orders   CBC with Differential/Platelet     Sciatica, right side       Did better with PT but can't afford to keep going. Will restart gabapentin and get her in  with PM&R. Call with any concerns.   Relevant Medications   gabapentin (NEURONTIN) 300 MG capsule   Other Relevant Orders   Ambulatory referral to Physical Medicine Rehab        Follow up plan: Return in about 6 weeks (around 06/06/2024).

## 2024-04-25 NOTE — Assessment & Plan Note (Signed)
 Doing great with A1c 6.1. Continue current regimen. Call with any concerns.

## 2024-04-26 ENCOUNTER — Encounter: Payer: Self-pay | Admitting: Family Medicine

## 2024-04-27 ENCOUNTER — Other Ambulatory Visit: Payer: Self-pay | Admitting: Family Medicine

## 2024-04-27 ENCOUNTER — Ambulatory Visit: Payer: Self-pay | Admitting: Family Medicine

## 2024-04-27 LAB — CBC WITH DIFFERENTIAL/PLATELET
Basophils Absolute: 0 x10E3/uL (ref 0.0–0.2)
Basos: 1 %
EOS (ABSOLUTE): 0.3 x10E3/uL (ref 0.0–0.4)
Eos: 5 %
Hematocrit: 37.2 % (ref 34.0–46.6)
Hemoglobin: 11.5 g/dL (ref 11.1–15.9)
Immature Grans (Abs): 0 x10E3/uL (ref 0.0–0.1)
Immature Granulocytes: 0 %
Lymphocytes Absolute: 1.8 x10E3/uL (ref 0.7–3.1)
Lymphs: 37 %
MCH: 26.4 pg — ABNORMAL LOW (ref 26.6–33.0)
MCHC: 30.9 g/dL — ABNORMAL LOW (ref 31.5–35.7)
MCV: 85 fL (ref 79–97)
Monocytes Absolute: 0.7 x10E3/uL (ref 0.1–0.9)
Monocytes: 13 %
Neutrophils Absolute: 2.2 x10E3/uL (ref 1.4–7.0)
Neutrophils: 44 %
Platelets: 117 x10E3/uL — ABNORMAL LOW (ref 150–450)
RBC: 4.36 x10E6/uL (ref 3.77–5.28)
RDW: 13.2 % (ref 11.7–15.4)
WBC: 5 x10E3/uL (ref 3.4–10.8)

## 2024-04-27 NOTE — Progress Notes (Signed)
 Letter printed and mailed.

## 2024-04-27 NOTE — Telephone Encounter (Signed)
 Printed and mailed

## 2024-04-29 NOTE — Telephone Encounter (Signed)
 Requested Prescriptions  Refused Prescriptions Disp Refills   isosorbide  mononitrate (IMDUR ) 60 MG 24 hr tablet [Pharmacy Med Name: Isosorbide  Mononitrate ER 60 MG Oral Tablet Extended Release 24 Hour] 90 tablet 0    Sig: Take 1 tablet by mouth once daily     Cardiovascular:  Nitrates Failed - 04/29/2024  9:16 PM      Failed - Last BP in normal range    BP Readings from Last 1 Encounters:  04/25/24 (!) 168/80         Passed - Last Heart Rate in normal range    Pulse Readings from Last 1 Encounters:  04/25/24 (!) 56         Passed - Valid encounter within last 12 months    Recent Outpatient Visits           4 days ago Type 2 diabetes mellitus with microalbuminuria, without long-term current use of insulin (HCC)   [place] [facility], Megan P, DO   3 months ago Routine general medical examination at a health care facility   [facility] [place], [place] P, DO   6 months ago [place]   [place] [facility] [place], Megan P, DO   8 months ago [facility]   [place] [facility] [place], Megan P, DO   8 months ago [place]   [place] [facility] [place], [place], DO

## 2024-06-07 ENCOUNTER — Encounter: Payer: Self-pay | Admitting: Family Medicine

## 2024-06-07 ENCOUNTER — Ambulatory Visit (INDEPENDENT_AMBULATORY_CARE_PROVIDER_SITE_OTHER): Admitting: Family Medicine

## 2024-06-07 VITALS — BP 162/62 | HR 60 | Temp 97.6°F | Ht 62.0 in | Wt 172.0 lb

## 2024-06-07 DIAGNOSIS — I10 Essential (primary) hypertension: Secondary | ICD-10-CM

## 2024-06-07 MED ORDER — AMLODIPINE BESYLATE 2.5 MG PO TABS: 2.5000 mg | ORAL_TABLET | Freq: Every day | ORAL | 0 refills | Status: AC

## 2024-06-07 NOTE — Assessment & Plan Note (Signed)
 Has only been taking 1/2 of her amlodipine - will go up to a whole tab and recheck 6 weeks. Call with any concerns.

## 2024-06-07 NOTE — Progress Notes (Signed)
 "  BP (!) 162/62   Pulse 60   Temp 97.6 F (36.4 C) (Oral)   Ht 5' 2 (1.575 m)   Wt 172 lb (78 kg)   SpO2 96%   BMI 31.46 kg/m    Subjective:    Patient ID: Susan Allen, female    DOB: 1941-03-27, 84 y.o.   MRN: [id number]  HPI: Susan Allen is a 84 y.o. female  Chief Complaint  Patient presents with   Hypertension   HYPERTENSION  Hypertension status: uncontrolled  Satisfied with current treatment? no Duration of hypertension: chronic BP monitoring frequency:  a few times a month BP range: 140s- 160s systolic BP medication side effects:  no Medication compliance: excellent compliance Previous BP meds:losartan , imdur , metoprolol , hydrochlorothiazide , amlodipine  Aspirin: no Recurrent headaches: no Visual changes: no Palpitations: no Dyspnea: no Chest pain: no Lower extremity edema: no Dizzy/lightheaded: no   Relevant past medical, surgical, family and social history reviewed and updated as indicated. Interim medical history since our last visit reviewed. Allergies and medications reviewed and updated.  Review of Systems  Constitutional: Negative.   Respiratory: Negative.    Cardiovascular: Negative.   Gastrointestinal: Negative.   Musculoskeletal: Negative.   Neurological: Negative.   Psychiatric/Behavioral: Negative.      Per HPI unless specifically indicated above     Objective:    BP (!) 162/62   Pulse 60   Temp 97.6 F (36.4 C) (Oral)   Ht 5' 2 (1.575 m)   Wt 172 lb (78 kg)   SpO2 96%   BMI 31.46 kg/m   Wt Readings from Last 3 Encounters:  06/07/24 172 lb (78 kg)  04/25/24 168 lb (76.2 kg)  01/25/24 166 lb 9.6 oz (75.6 kg)    Physical Exam Vitals and nursing note reviewed.  Constitutional:      General: She is not in acute distress.    Appearance: Normal appearance. She is not ill-appearing, toxic-appearing or diaphoretic.  HENT:     Head: Normocephalic and atraumatic.     Right Ear: External ear normal.     Left Ear: External ear  normal.     Nose: Nose normal.     Mouth/Throat:     Mouth: Mucous membranes are moist.     Pharynx: Oropharynx is clear.  Eyes:     General: No scleral icterus.       Right eye: No discharge.        Left eye: No discharge.     Extraocular Movements: Extraocular movements intact.     Conjunctiva/sclera: Conjunctivae normal.     Pupils: Pupils are equal, round, and reactive to light.  Cardiovascular:     Rate and Rhythm: Normal rate and regular rhythm.     Pulses: Normal pulses.     Heart sounds: Normal heart sounds. No murmur heard.    No friction rub. No gallop.  Pulmonary:     Effort: Pulmonary effort is normal. No respiratory distress.     Breath sounds: Normal breath sounds. No stridor. No wheezing, rhonchi or rales.  Chest:     Chest wall: No tenderness.  Musculoskeletal:        General: Normal range of motion.     Cervical back: Normal range of motion and neck supple.  Skin:    General: Skin is warm and dry.     Capillary Refill: Capillary refill takes less than 2 seconds.     Coloration: Skin is not jaundiced or pale.  Findings: No bruising, erythema, lesion or rash.  Neurological:     General: No focal deficit present.     Mental Status: She is alert and oriented to person, place, and time. Mental status is at baseline.  Psychiatric:        Mood and Affect: Mood normal.        Behavior: Behavior normal.        Thought Content: Thought content normal.        Judgment: Judgment normal.     Results for orders placed or performed in visit on 04/25/24  Bayer DCA Hb A1c Waived   Collection Time: 04/25/24  9:44 AM  Result Value Ref Range   HB A1C (BAYER DCA - WAIVED) 6.1 (H) 4.8 - 5.6 %  CBC with Differential/Platelet   Collection Time: 04/25/24  9:46 AM  Result Value Ref Range   WBC 5.0 3.4 - 10.8 x10E3/uL   RBC 4.36 3.77 - 5.28 x10E6/uL   Hemoglobin 11.5 11.1 - 15.9 g/dL   Hematocrit 62.7 65.9 - 46.6 %   MCV 85 79 - 97 fL   MCH 26.4 (L) 26.6 - 33.0 pg    MCHC 30.9 (L) 31.5 - 35.7 g/dL   RDW 86.7 88.2 - 84.5 %   Platelets 117 (L) 150 - 450 x10E3/uL   Neutrophils 44 Not Estab. %   Lymphs 37 Not Estab. %   Monocytes 13 Not Estab. %   Eos 5 Not Estab. %   Basos 1 Not Estab. %   Neutrophils Absolute 2.2 1.4 - 7.0 x10E3/uL   Lymphocytes Absolute 1.8 0.7 - 3.1 x10E3/uL   Monocytes Absolute 0.7 0.1 - 0.9 x10E3/uL   EOS (ABSOLUTE) 0.3 0.0 - 0.4 x10E3/uL   Basophils Absolute 0.0 0.0 - 0.2 x10E3/uL   Immature Granulocytes 0 Not Estab. %   Immature Grans (Abs) 0.0 0.0 - 0.1 x10E3/uL   Hematology Comments: Note:       Assessment & Plan:   Problem List Items Addressed This Visit       Cardiovascular and Mediastinum   Essential hypertension - Primary   Has only been taking 1/2 of her amlodipine - will go up to a whole tab and recheck 6 weeks. Call with any concerns.       Relevant Medications   amLODipine  (NORVASC ) 2.5 MG tablet     Follow up plan: Return in about 6 weeks (around 07/19/2024).      "

## 2024-06-13 ENCOUNTER — Encounter: Payer: Self-pay | Admitting: Family Medicine

## 2024-06-13 ENCOUNTER — Other Ambulatory Visit: Payer: Self-pay | Admitting: Family Medicine

## 2024-06-13 DIAGNOSIS — M25551 Pain in right hip: Secondary | ICD-10-CM

## 2024-06-16 ENCOUNTER — Ambulatory Visit
Admission: RE | Admit: 2024-06-16 | Discharge: 2024-06-16 | Disposition: A | Source: Ambulatory Visit | Attending: Family Medicine | Admitting: Family Medicine

## 2024-06-16 DIAGNOSIS — M25551 Pain in right hip: Secondary | ICD-10-CM

## 2024-07-20 ENCOUNTER — Ambulatory Visit

## 2024-07-20 ENCOUNTER — Ambulatory Visit: Admitting: Family Medicine
